# Patient Record
Sex: Female | Born: 1970 | Race: Black or African American | Hispanic: No | Marital: Married | Smoking: Never smoker
Health system: Southern US, Community
[De-identification: ages and names within clinical notes are randomized; demographics above are authoritative.]

## PROBLEM LIST (undated history)

## (undated) DIAGNOSIS — IMO0002 Reserved for concepts with insufficient information to code with codable children: Secondary | ICD-10-CM

## (undated) DIAGNOSIS — T7840XA Allergy, unspecified, initial encounter: Secondary | ICD-10-CM

## (undated) DIAGNOSIS — M1990 Unspecified osteoarthritis, unspecified site: Secondary | ICD-10-CM

## (undated) DIAGNOSIS — R011 Cardiac murmur, unspecified: Secondary | ICD-10-CM

## (undated) DIAGNOSIS — R519 Headache, unspecified: Secondary | ICD-10-CM

## (undated) HISTORY — DX: Allergy, unspecified, initial encounter: T78.40XA

## (undated) HISTORY — PX: LAPAROSCOPIC ENDOMETRIOSIS FULGURATION: SUR769

## (undated) HISTORY — DX: Headache, unspecified: R51.9

## (undated) HISTORY — PX: LAPAROSCOPY: SHX197

## (undated) HISTORY — PX: ANKLE SURGERY: SHX546

## (undated) HISTORY — DX: Unspecified osteoarthritis, unspecified site: M19.90

## (undated) HISTORY — DX: Reserved for concepts with insufficient information to code with codable children: IMO0002

## (undated) HISTORY — PX: WISDOM TOOTH EXTRACTION: SHX21

## (undated) HISTORY — PX: OTHER SURGICAL HISTORY: SHX169

## (undated) HISTORY — DX: Cardiac murmur, unspecified: R01.1

---

## 1998-08-11 HISTORY — PX: CHOLECYSTECTOMY: SHX55

## 2005-01-12 ENCOUNTER — Emergency Department: Payer: Self-pay | Admitting: Emergency Medicine

## 2005-08-20 ENCOUNTER — Emergency Department: Payer: Self-pay | Admitting: General Practice

## 2005-09-01 ENCOUNTER — Ambulatory Visit: Payer: Self-pay | Admitting: Family Medicine

## 2005-10-07 ENCOUNTER — Ambulatory Visit: Payer: Self-pay | Admitting: General Surgery

## 2005-10-23 ENCOUNTER — Ambulatory Visit: Payer: Self-pay | Admitting: Family Medicine

## 2005-11-24 ENCOUNTER — Ambulatory Visit: Payer: Self-pay | Admitting: Gynecology

## 2005-11-28 ENCOUNTER — Emergency Department: Payer: Self-pay | Admitting: General Practice

## 2005-12-08 ENCOUNTER — Encounter (INDEPENDENT_AMBULATORY_CARE_PROVIDER_SITE_OTHER): Payer: Self-pay | Admitting: Specialist

## 2005-12-08 ENCOUNTER — Ambulatory Visit (HOSPITAL_COMMUNITY): Admission: RE | Admit: 2005-12-08 | Discharge: 2005-12-08 | Payer: Self-pay | Admitting: Gynecology

## 2005-12-08 ENCOUNTER — Ambulatory Visit: Payer: Self-pay | Admitting: Gynecology

## 2005-12-22 ENCOUNTER — Ambulatory Visit: Payer: Self-pay | Admitting: Gynecology

## 2006-01-28 ENCOUNTER — Ambulatory Visit: Payer: Self-pay | Admitting: Gynecology

## 2006-03-21 ENCOUNTER — Emergency Department: Payer: Self-pay | Admitting: General Practice

## 2006-05-19 ENCOUNTER — Ambulatory Visit: Payer: Self-pay | Admitting: Gynecology

## 2006-08-11 HISTORY — PX: APPENDECTOMY: SHX54

## 2006-08-28 ENCOUNTER — Emergency Department: Payer: Self-pay | Admitting: Emergency Medicine

## 2006-08-31 ENCOUNTER — Ambulatory Visit: Payer: Self-pay | Admitting: Gynecology

## 2006-11-30 ENCOUNTER — Encounter (INDEPENDENT_AMBULATORY_CARE_PROVIDER_SITE_OTHER): Payer: Self-pay | Admitting: Gynecology

## 2006-11-30 ENCOUNTER — Ambulatory Visit: Payer: Self-pay | Admitting: Gynecology

## 2006-12-15 ENCOUNTER — Ambulatory Visit: Payer: Self-pay | Admitting: Gynecology

## 2007-03-16 ENCOUNTER — Ambulatory Visit: Payer: Self-pay | Admitting: Gynecology

## 2007-07-12 ENCOUNTER — Ambulatory Visit: Payer: Self-pay | Admitting: Gynecology

## 2007-10-12 ENCOUNTER — Ambulatory Visit: Payer: Self-pay | Admitting: Family Medicine

## 2007-12-30 ENCOUNTER — Encounter: Payer: Self-pay | Admitting: Obstetrics & Gynecology

## 2007-12-30 ENCOUNTER — Ambulatory Visit: Payer: Self-pay | Admitting: Gynecology

## 2008-03-16 ENCOUNTER — Ambulatory Visit: Payer: Self-pay | Admitting: Gynecology

## 2008-03-21 ENCOUNTER — Encounter: Payer: Self-pay | Admitting: Internal Medicine

## 2008-03-28 ENCOUNTER — Ambulatory Visit: Payer: Self-pay | Admitting: Gastroenterology

## 2008-04-07 ENCOUNTER — Ambulatory Visit: Payer: Self-pay | Admitting: Gastroenterology

## 2008-06-08 ENCOUNTER — Ambulatory Visit: Payer: Self-pay | Admitting: Obstetrics & Gynecology

## 2008-08-29 ENCOUNTER — Ambulatory Visit: Payer: Self-pay | Admitting: Obstetrics and Gynecology

## 2008-08-30 ENCOUNTER — Encounter: Payer: Self-pay | Admitting: Family Medicine

## 2008-11-23 ENCOUNTER — Ambulatory Visit: Payer: Self-pay | Admitting: Obstetrics & Gynecology

## 2008-12-25 ENCOUNTER — Encounter: Payer: Self-pay | Admitting: Obstetrics & Gynecology

## 2008-12-25 ENCOUNTER — Ambulatory Visit: Payer: Self-pay | Admitting: Obstetrics & Gynecology

## 2009-02-08 ENCOUNTER — Ambulatory Visit: Payer: Self-pay | Admitting: Podiatry

## 2009-04-09 ENCOUNTER — Encounter: Admission: RE | Admit: 2009-04-09 | Discharge: 2009-04-09 | Payer: Self-pay | Admitting: Orthopedic Surgery

## 2009-10-02 ENCOUNTER — Ambulatory Visit: Payer: Self-pay | Admitting: Obstetrics & Gynecology

## 2009-10-25 ENCOUNTER — Ambulatory Visit: Payer: Self-pay | Admitting: Obstetrics & Gynecology

## 2009-10-26 ENCOUNTER — Ambulatory Visit: Payer: Self-pay | Admitting: Obstetrics & Gynecology

## 2009-10-29 ENCOUNTER — Ambulatory Visit: Payer: Self-pay | Admitting: Obstetrics and Gynecology

## 2010-01-01 ENCOUNTER — Ambulatory Visit: Payer: Self-pay | Admitting: Family Medicine

## 2010-11-20 ENCOUNTER — Other Ambulatory Visit: Payer: Self-pay | Admitting: General Practice

## 2010-12-23 ENCOUNTER — Emergency Department: Payer: Self-pay | Admitting: Emergency Medicine

## 2010-12-24 NOTE — Assessment & Plan Note (Signed)
NAME:  [name] [name], [name] [name]                 ACCOUNT NO.:  [id number]   MEDICAL RECORD NO.:  [id number]          PATIENT TYPE:  POB   LOCATION:  [facility] at [facility]         FACILITY:  [facility]   PHYSICIAN:  Elsie Lincoln, MD      DATE OF BIRTH:  March 03, 1971   DATE OF SERVICE:                                  CLINIC NOTE   The patient is a 40 year old para 1 female, last menstrual period in  2008 secondary to Depo who presents for physical exam.  She has no  complaints today.  She is on the Depo for endometriosis and has had good  pain control from that.  Her endometriosis diagnosis is by laparoscopy.  She has occasional pain with deep penetration but not enough to require  any and intervention of time.   PAST MEDICAL HISTORY:  Asthma.   PAST SURGICAL HISTORY:  1. Ankle surgery.  2. C-section.  3. Gallbladder operative laparoscopy for endometriosis with      appendectomy by Dr. Mia Creek.   GYN HISTORY:  Ovarian cyst in 2007 which prompted the laparoscopic  surgery and endometriosis.   OB HISTORY:  C-section x1.   FAMILY HISTORY:  Dad died of colon cancer when she was only 3.  She has  had 2 colonoscopies in the past and is in need of another one.  We will  refer her to Dr. Stan Head today.   ALLERGIES:  ASPIRIN.   MEDICATIONS:  1. Depo-Provera.  2. Ventolin.  3. Asmanex.  4. Singulair.   REVIEW OF SYSTEMS:  Negative.   PHYSICAL EXAM:  Pulse 69.  Blood pressure 119/71.  Weight 136.  Height 5  feet 3.  GENERAL:  Well nourished, well developed, no apparent distress.  HEENT:  Normocephalic, atraumatic.  NECK:  No masses, normal thyroid.  LUNGS:  Clear to auscultation bilaterally.  HEART:  Regular rate and rhythm.  BREASTS:  No masses.  No lymphadenopathy.  ABDOMEN:  Soft, nontender.  No organomegaly.  No hernia.  Well-healed  laparoscopy incision.  GENITALIA:  Tanner 5 with a small inclusion cyst on the left labia  majora.  Vagina pink.  Normal rugae.  Cervix closed,  nulliparous,  nontender.  Uterus nontender, midline.  Adnexa, no masses, nontender.  Good support of urethra.  No cystocele, rectocele.  No hemorrhoids.  EXTREMITIES:  Nontender.  No edema.   ASSESSMENT/PLAN:  A 40 year old female for Pap smear today.  1. Pap smear.  2. Referral to [facility] gastroenterology.  3. Depo-Provera every 12 weeks.           ______________________________  Elsie Lincoln, MD     KL/MEDQ  D:  12/30/2007  T:  12/30/2007  Job:  [id number]

## 2010-12-24 NOTE — Assessment & Plan Note (Signed)
NAME:  [name] [name], [name] [name]                 ACCOUNT NO.:  [id number]   MEDICAL RECORD NO.:  [id number]          PATIENT TYPE:  POB   LOCATION:  [facility] at [facility]         FACILITY:  [facility]   PHYSICIAN:  Scheryl Darter, MD       DATE OF BIRTH:  1971/05/26   DATE OF SERVICE:                                  CLINIC NOTE   The patient comes today with about a month of discomfort with  intercourse.  She says that upon penetration there is a sensation that  there is something blocking the vaginal canal.  She does not state that  this is painful.  She currently uses NuvaRing as a continuous regimen so  that there is no withdrawal bleeding.  Due to the history of  endometriosis, she changes the NuvaRing every 3 weeks.  She recently  treated herself for yeast infection with Monistat after taking  antibiotics last month.  She says she has not noticed any itching, but  only a discharge.  She notes no itching now.  No bleeding.  She has a  bowel movement about 3 times a week.  She has had problems with IBS  constipation.   PHYSICAL EXAMINATION:  GENERAL:  The patient in no acute distress.  ABDOMEN:  Soft, nontender.  PELVIC:  External genitalia, vagina and cervix appeared normal with a  NuvaRing in place and white discharge consistent with yeast.  Uterus is  nontender, normal size.  No adnexal masses.  Upon bimanual exam, I could  feel stool in the rectum through the posterior vaginal wall.   IMPRESSION:  1. Discomfort with intercourse.  2. Yeast vaginitis.  3. History of constipation.   PLAN:  Her symptoms may simply be vaginal dryness and irritation.  I  gave her prescription for Diflucan 150 mg tablet.  Advised her that she  could use the NuvaRing for 4 weeks at a time before changing.  Most  likely she will keep with her current schedule.  She will notify us if  her symptoms do not improve.  I reassured her that there was no sign of  any sort of mass in her pelvis that is causing her  symptoms.      Scheryl Darter, MD     JA/MEDQ  D:  10/02/2009  T:  10/03/2009  Job:  [id number]

## 2010-12-24 NOTE — Assessment & Plan Note (Signed)
NAME:  [name] [name], [name] [name]                 ACCOUNT NO.:  [id number]   MEDICAL RECORD NO.:  [id number]          PATIENT TYPE:  POB   LOCATION:  [facility] at [facility]         FACILITY:  [facility]   PHYSICIAN:  Tinnie Gens, MD        DATE OF BIRTH:  October 22, 1970   DATE OF SERVICE:  01/01/2010                                  CLINIC NOTE   CHIEF COMPLAINT:  Yearly exam.   HISTORY OF PRESENT ILLNESS:  The patient is a 40 year old gravida 1,  para 1 who is in today for physical exam.  She currently uses a NuvaRing  vaginally continuously since she does not have cycles and they control  endometriosis pain.  This has been working well for her.  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   PAST MEDICAL HISTORY:  Significant for asthma.   PAST SURGICAL HISTORY:  She had ankle surgery to repair, damage done by  lawnmower when she was a young child.  She has had a C-section.  She had  a operative laparoscopy for endometriosis, appendectomy,  cholecystectomy.   FAMILY HISTORY:  Father died of colon cancer when she was 3.   ALLERGIES:  ASPIRIN.   MEDICATIONS:  1. She is on Ventolin p.r.n.  2. Ibuprofen as needed.   GYN HISTORY:  History of endometriosis and ovarian cyst.   OB HISTORY:  One C-section.   REVIEW OF SYSTEMS:  Fourteen-point review of systems reviewed.  She  denies headache, vision changes, hearing loss, chest pain, shortness of  breath, abdominal pain, nausea, vomiting, diarrhea, constipation, blood  in her stool, blood in her urine, vaginal discharge, breast mass or  lump, swelling in her feet or ankles.   PHYSICAL EXAMINATION:  VITAL SIGNS:  On exam today, vitals are as in the  chart.  GENERAL:  She is a well-developed, well-nourished female in no acute  distress.  HEENT:  Normocephalic, atraumatic.  Sclerae anicteric.  NECK:  Supple.  Normal thyroid.  LUNGS:  Clear bilaterally.  CV:  Regular rate and rhythm without gallops or murmurs.  ABDOMEN:  Soft, nontender,  nondistended.  EXTREMITIES:  No cyanosis, clubbing, or edema.  BREASTS:  Symmetric with everted nipples.  No masses.  No  supraclavicular or axillary adenopathy.  GU:  Normal external female genitalia.  BUS is normal.  Vagina is pink  and rugated.  Cervix is nulliparous without lesion.  Uterus is small,  anteverted.  No adnexal mass or tenderness.   IMPRESSION:  GYN exam.   PLAN:  1. Pap smear today.  2. Refill ibuprofen.           ______________________________  Tinnie Gens, MD     TP/MEDQ  D:  01/01/2010  T:  01/02/2010  Job:  [id number]

## 2010-12-24 NOTE — Assessment & Plan Note (Signed)
NAME:  [name] [name], [name] [name]                 ACCOUNT NO.:  [id number]   MEDICAL RECORD NO.:  [id number]          PATIENT TYPE:  POB   LOCATION:  [facility] at [facility]         FACILITY:  [facility]   PHYSICIAN:  Elsie Lincoln, MD      DATE OF BIRTH:  1971/04/24   DATE OF SERVICE:  12/25/2008                                  CLINIC NOTE   This is a 40 year old, para 1, female who presents for her annual exam.  The patient is a long-term patient of Dr. Mia Creek.  For endometriosis,  the patient used to be on Depo-Lupron, was then on Depo-Provera for a  while.  She started experiencing breakthrough bleeding, so we changed to  the NuvaRing which has helped.  She does have tender breasts also on the  NuvaRing, but it is not unbearable.  Her pain as well controlled.  Occasionally, she does have mild pelvic pain.  Her sexual life is not  interrupted by this pain.  The patient has no complaints today.  I did  talk to the patient about her bowel movements, and she is very  constipated.  I actually told the pain will be better if she has more  regular bowel movements.   PAST MEDICAL HISTORY:  Asthma.   PAST SURGICAL HISTORY:  Ankle surgery, C-section, operative laparoscopy  for endometriosis, appendectomy, and cholecystectomy.   FAMILY HISTORY:  No changes in familial cancers or blood clots.   MEDICATIONS:  Voltaren cream, NuvaRing, and Mobic.   ALLERGIES:  ASPIRIN.   Review of systems is positive for constipation.   PHYSICAL EXAMINATION:  VITAL SIGNS:  Pulse 67, blood pressure 122/79,  weight 139, height 5 feet 3 inches.  GENERAL:  Well nourished, well developed in no apparent distress.  HEENT:  Normocephalic, atraumatic.  Good dentition.  NECK:  Thyroid, no masses or lymphadenopathy in the neck.  LUNGS:  Clear to auscultation bilaterally.  HEART:  Regular rate and rhythm.  BREASTS:  No masses.  Nontender.  No nipple discharge or skin changes.  No lymphadenopathy in the axilla.  ABDOMEN:  Soft,  nontender.  No rebound.  No guarding.  No organomegaly.  No hernia.  Well healed incisions from her laparoscopies.  GENITALIA:  Tanner V.  Vagina pink, normal rugae.  Urethra and bladder  well suspended, nontender.  Cervix closed, nontender.  Uterus nontender,  mobile.  Adnexa mildly tender.  You could feel bowel gas on the bimanual  but no masses.  EXTREMITIES:  Nontender.   ASSESSMENT AND PLAN:  A 40 year old female for well-woman exam.  1. Pap smear.  I reviewed ACOG with the patient, if this one is      negative, we will have 3 documented negative Pap smears.  No      history of human immunodeficiency virus, immunosuppression, DES      exposure, she will have a q. 3 year screening.  2. MiraLax for constipation.  3. Decrease sodas and start exercising for cardiovascular health and      weight loss.  4. Continue NuvaRing.  The patient is still a nonsmoker.  5. Return to clinic in a year.  ______________________________  Elsie Lincoln, MD     KL/MEDQ  D:  12/25/2008  T:  12/26/2008  Job:  [id number]

## 2010-12-24 NOTE — Assessment & Plan Note (Signed)
NAME:  [name] [name], [name] [name]                 ACCOUNT NO.:  [id number]   MEDICAL RECORD NO.:  [id number]          PATIENT TYPE:  POB   LOCATION:  [facility] at [facility]         FACILITY:  [facility]   PHYSICIAN:  Johnella Moloney, MD        DATE OF BIRTH:  [birth year]   DATE OF SERVICE:  11/23/2008                                  CLINIC NOTE   The patient is a 40 year old gravida 1, para 1 with a long history of  endometriosis, who was treated with Depo-Lupron for over 1 year switched  over to Depo-Provera in December 2008.  The patient reports that she has  had no problems with the Depo-Provera until 3 months ago when she starts  to have daily spotting.  She is very bothered by the amount of spotting  she is having and wants to discuss other alternatives to treating her  endometriosis.  The patient reports occasional pelvic pain that is  severe.  She denies any other gynecologic concerns.   MEDICAL HISTORY:  Only remarkable for asthma.   SURGICAL HISTORY:  Remarkable for ankle surgery, cesarean section,  operative laparoscopy for endometriosis with appendectomy and  cholecystectomy.   PHYSICAL EXAMINATION:  Deferred for this patient.  She does have stable  vital signs noted in her physical chart.   IMPRESSION:  The patient is a 40 year old gravida 1, para 1 here for  followup of her endometriosis and discussion of further options for  endometriosis management.  Her last Depo-Provera injection was given on  August 29, 2008.  The patient is interested in other modalities.  I  discussed with her that her spotting could be alleviated by adding  estrogen back to her therapy in the form of oral contraceptive pills or  NuvaRing.  Also discussed the possibility of using a Depo-Delalutin just  to stabilize her endometrium.  However, this might increase her pelvic  pain given the large dosage of estrogen.  Also revisited Depo-Lupron,  but suggested that this time she would need to have add-back therapy for  that.  The patient thought about these modalities and wants to proceed  with possible NuvaRing or oral contraceptive pill.  She did not seem to  make a final decision about which modality she will go by, so she was  given a sample of Loestrin 24 and a sample of NuvaRing to use.  The  patient will return next month for her annual Pap smear.  Of note, her  last Pap smear was in Dec 30, 2007, and was negative.  At this point,  she would let us know which modality she fell works better for her and  we will proceed with that.  The patient was given written information  about Lupron and add-back therapy.  She is interested in future  fertility, so an ablation or hysterectomy is not an option for her at  this point.  We will continue to monitor her endometriosis and alter her  therapy or management modality as needed.            ______________________________  Johnella Moloney, MD     UD/MEDQ  D:  11/23/2008  T:  11/24/2008  Job:  [id number]

## 2010-12-24 NOTE — Assessment & Plan Note (Signed)
NAME:  [name] [name], [name] [name]                 ACCOUNT NO.:  [id number]   MEDICAL RECORD NO.:  [id number]          PATIENT TYPE:  POB   LOCATION:  [facility] at [facility]         [facility]ACILITY:  [facility]   PHYSICIAN:  Elsie Lincoln, MD      DATE OF BIRTH:  Jun 23, 1971   DATE OF SERVICE:  10/25/2009                                  CLINIC NOTE   The patient is a 40 year old female who is complaining of spasm in the  lower pelvic and increased urinary frequency.  This has been going on  for approximately a month, has increased lately.  The patient denies any  dysuria.  The patient has been having more with frequent bowel movements  and she has had one in the past few days every day and has been softer  and not hard bowels as usual.  She does not feel like she completely  empty her bladder.  She is not having as much pain with sex.  She feels  like there are butterflies in her lower pelvis.   PHYSICAL EXAMINATION:  VITAL SIGNS:  Pulse 72, blood pressure 122/83,  weight 131, and height 5 feet 3 inches.  GENERAL:  Well nourished, well developed, no apparent distress.  ABDOMEN:  Soft, nontender.  No rebound or guarding.  GENITALIA:  Tanner V.  Vagina pink, normal rugae.  Bladder tender over  palpation.  Cervix is closed.  Uterus is anterior.  Adnexa, no masses,  nontender.  No stool in rectal vault.   ASSESSMENT AND PLAN:  A 40 year old female with urinary frequency and  pelvic cramping.  1. Urinalysis shows trace blood.  The patient will be treated with      Cipro 500 mg p.o. b.i.d. for 3 days and Pyridium for symptoms.  2. If the patient is not better by tomorrow, we will do post-void      residual.  3. Continue all other medications.  4. The patient does not seem to be constipated today based on      decreased stool in the vault.           ______________________________  Elsie Lincoln, MD     KL/MEDQ  D:  10/25/2009  T:  10/25/2009  Job:  [id number]

## 2010-12-26 ENCOUNTER — Ambulatory Visit: Payer: Self-pay | Admitting: General Practice

## 2010-12-27 NOTE — Op Note (Signed)
NAME:  [name] [name], [name] [name]                 ACCOUNT NO.:  [id number]   MEDICAL RECORD NO.:  [id number]          PATIENT TYPE:  AMB   LOCATION:  [facility]                           FACILITY:  [facility]   PHYSICIAN:  Ginger Carne, MD  DATE OF BIRTH:  1971-02-04   DATE OF PROCEDURE:  12/08/2005  DATE OF DISCHARGE:                                 OPERATIVE REPORT   PREOPERATIVE DIAGNOSIS:  Chronic pelvic pain.   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   OPERATIVE PROCEDURE:  Laparoscopic excision of left ovarian endometrioma and  laparoscopic appendectomy.   SURGEON:  Ginger Carne, MD   ASSISTANT:  None.   COMPLICATIONS:  None immediate.   ESTIMATED BLOOD LOSS:  Minimal.   SPECIMEN:  Appendix and left ovarian endometrioma.   ANESTHESIA:  General.   OPERATIVE FINDINGS:  Laparoscopic evaluation demonstrated evidence of a 2 cm  endometrioma located in the antimesenteric portion of the left ovary.  There  were areas of punctate lesions of endometriosis along the left broad  ligament with adherence of portion of the left ovary to endometriosis on the  posterior aspect of the broad ligament.  The right broad ligament, tube and  ovary were relatively free of endometriosis.  No evidence of adhesive  disease of either tube.  The uterus was normal in size and contour, anterior  bladder flap appeared normal.  Appendix was thickened, short, suggestive of  the subacute appendicitis or fecalith with appendiceal flecks along the  base.  Large and small bowel grossly normal, no evidence of femoral,  inguinal or obturator hernias.  Upper abdomen appeared normal.   OPERATIVE PROCEDURE:  The patient prepped and draped in the usual fashion  and placed in lithotomy position.  Betadine solution was used for  antiseptic.  The patient was catheterized prior to the procedure.  After  adequate general anesthesia, a  tenaculum was placed on the antrum lip of the  cervix and a Hulka tenaculum in the endocervical canal.  Afterwards a  vertical infraumbilical incision was made and a Veress needle placed in the  abdomen.  Closing pressures were 10 to .  Needle released, trocar  placed in the same incision.  Laparoscope placed in trocar sleeve.  Two 5 mm  ports were made under direct visualization in the left lower quadrant, left  hypogastric regions.  Following this using bipolar cautery and sharp  dissection, the left ovarian endometrioma was excised.  The base was  cauterized as well as the edges with no active bleeding noted after copious  irrigation lactated Ringer's.  Areas of additional endometriotic areas were  excised with the left ureter in full view.   At this point the appendix was removed by bipolar cautery of the  mesoappendix to the base.  2-0 Vicryl loops placed at the base one about 7-8  mm above the first two.  Appendix cut above the base sutures, removed with  an Endopouch bag.  No active bleeding noted at the base and copious  irrigation with  lactated Ringer's followed and removal of irrigant as well.  Afterwards gas released, trocars removed.  Closure of 10 mm fascia site with  0 Vicryl suture and 4-0 Vicryl for subcuticular closures,  Instrument and  sponge count were correct.  The patient tolerated the procedure well,  returned to post anesthesia recovery room in excellent condition.      Ginger Carne, MD  Electronically Signed     SHB/MEDQ  D:  12/08/2005  T:  12/09/2005  Job:  [id number]

## 2010-12-27 NOTE — H&P (Signed)
NAME:  [name], [name]                 ACCOUNT NO.:  [id number]   MEDICAL RECORD NO.:  [id number]          PATIENT TYPE:  AMB   LOCATION:  [facility]                           FACILITY:  [facility]   PHYSICIAN:  Ginger Carne, MD  DATE OF BIRTH:  May 04, 1971   DATE OF ADMISSION:  12/08/2005  DATE OF DISCHARGE:                                HISTORY & PHYSICAL   REASON FOR HOSPITALIZATION:  Chronic pelvic pain.   HISTORY OF PRESENT ILLNESS:  This patient is a 39 year old gravida 1, para 1-  0-0-1 African-American female referred by Dr. Carlynn Purl in [place], [place] because of a history of chronic lower and midline pelvic pain and  resolving left ovarian cyst.  The patient had two sonograms in the early  winter of 2007 demonstrating a cystic enlargement of the left ovary with  multiple follicular cysts.  Prior to that time, the patient has had a 5-6  month history of chronic pelvic pain, worse on the left.  She complains of  dyspareunia and dysmenorrhea, as well.  Menses are q. 28 days, lasting 3-4  days.  She does not use any form of contraception.  The patient has no  genitourinary, gastrointestinal or musculoskeletal sources for discomfort.  The patient has elected to pursue definitive diagnosis as a cause of her  pelvic pain.   OBSTETRIC/GYNECOLOGIC HISTORY:  The patient has had a cesarean section in  1995.   SURGICAL HISTORY:  In 1988m she had a laparoscopic cholecystectomy.   ALLERGIES:  ASPIRIN.   CURRENT MEDICATIONS:  1.  Ferrous sulfate.  2.  Ibuprofen.   SOCIAL HISTORY:  Denies smoking, illicit drug or alcohol abuse.   FAMILY HISTORY:  Noncontributory.   REVIEW OF SYSTEMS:  10-point comprehensive review of systems unremarkable.   PHYSICAL EXAMINATION:  VITAL SIGNS:  Blood pressure 129/81, weight 125  pounds, height 62 inches, pulse 76 and regular.  ABDOMEN:  Soft without gross hepatosplenomegaly.  LUNGS:  Clear to percussion and auscultation.  CARDIOVASCULAR:  Without  murmurs or enlargements.  Regular rate and rhythm.  EXTREMITY/LYMPHATIC/SKIN/NEUROLOGICAL/MUSCULOSKELETAL:  Normal.  ABDOMEN:  Soft without gross hepatosplenomegaly.  PELVIC:  External genitalia, vulva and vagina normal.  Cervix smooth with  slight tenderness on motion.  Both adnexa tender on motion but without  fullness.  Uterus is normal size, slightly tender on palpation.  RECTAL:  Hemoccult negative without masses.   LABORATORY DATA:  Urinalysis is normal.  Transvaginal ultrasound obtained on  November 25, 2005 demonstrates normal-appearing uterus and right adnexa.  Left  adnexa reveals a 13 mm complex cystic mass, consistent with either a corpus  luteal cyst or endometrioma.  Overall dimension was 2.8 cm.  No free fluid  noted.   IMPRESSION:  Chronic pelvic pain.   PLAN:  The patient has symptoms suggestive of endometriosis; however, this  is not diagnostic by itself.  She is, therefore, scheduled for an operative  laparoscopy to evaluate the etiology for her pain.  The nature of said  procedure discussed in detail.      Ginger Carne, MD  Electronically Signed  SHB/MEDQ  D:  12/02/2005  T:  12/02/2005  Job:  [id number]

## 2010-12-27 NOTE — Group Therapy Note (Signed)
NAME:  [name] [name], MENON                 ACCOUNT NO.:  [id number]   MEDICAL RECORD NO.:  [id number]          PATIENT TYPE:   LOCATION:  [facility]                     FACILITY:   PHYSICIAN:  Ginger Carne, MD DATE OF BIRTH:  [birth year]   DATE OF SERVICE:                                    CLINIC NOTE   REASON FOR CONSULTATION:  Chronic left lower quadrant pain.   HISTORY OF PRESENT ILLNESS:  This patient is a 40 year old gravida 1, para 1-  0-0-1, African American female referred by Dr. Carlynn Purl because of history of  left ovarian cyst and chronic left lower quadrant pain.  The patient has had  two sonograms in 2007, revealing enlargement of her left ovary with multiple  cysts.  The patient states that over the past five months, she has had  worsening left lower quadrant, as well as central pelvic pain, which has  worsened over the past two months.  She complains of dyspareunia and  dysmenorrhea, as well.  Menses are every 28 days, lasting three to four  days.  At this time, she does not use any form of contraception.  The  patient has no genitourinary, gastrointestinal, or musculoskeletal sources  for her discomfort.   OB/GYN HISTORY:  The patient has had a cesarean section in 1995.   SURGICAL HISTORY:  In 1998, she had a laparoscopic cholecystectomy.   CURRENT MEDICATIONS:  Ferrous sulfate and ibuprofen.   ALLERGIES:  Aspirin.   SOCIAL HISTORY:  Denies smoking, illicit drug or alcohol abuse.   FAMILY HISTORY:  Noncontributory.   REVIEW OF SYSTEMS:  Ten-point comprehensive review of systems unremarkable.   PHYSICAL FINDINGS:  Blood pressure 129/81, weight 125 pounds, height 62  minutes, pulse 76 and regular.  Abdomen:  Soft without gross  hepatosplenomegaly.  Tenderness in the pelvic region.  Pelvic:  External  genitalia, vulva and vagina normal.  Cervix smooth without erosions or  lesions.  Uterine tenderness on motion, as well as both adnexa but without  fullness.   Rectal:  Hemoccult negative without masses.   Urinalysis is normal.   Transvaginal ultrasound reveals a normal-appearing uterus and right adnexa.  Left adnexa reveals a 13-mm left cyst consistent with either a corpus luteum  and/or endometrioma.  Overall ovarian dimension is 2.8 cm.  No free fluid  noted.   IMPRESSION:  Chronic pelvic pain.   PLAN:  The patient has symptoms suggestive of endometriosis but not  diagnostic.  The patient is scheduled for an operative laparoscopy to  evaluate the etiology for her pain and to determine if, in fact, she has  endometriosis.  The patient was given full explanation as to the nature of  said procedure including risks and benefits and a booklet provided.  She  will be scheduled in the near future for said surgery.  All questions  answered to the satisfaction of said patient.           ______________________________  Ginger Carne, MD     SHB/MEDQ  D:  11/25/2005  T:  11/26/2005  Job:  [id number]

## 2010-12-31 ENCOUNTER — Telehealth: Payer: Self-pay | Admitting: Cardiovascular Disease

## 2010-12-31 ENCOUNTER — Ambulatory Visit: Payer: Self-pay | Admitting: Cardiovascular Disease

## 2010-12-31 NOTE — Telephone Encounter (Signed)
LMOM in regards to missed appt with Gollan on 12/31/10.

## 2011-01-08 ENCOUNTER — Ambulatory Visit: Payer: Self-pay

## 2011-01-15 ENCOUNTER — Ambulatory Visit (INDEPENDENT_AMBULATORY_CARE_PROVIDER_SITE_OTHER): Payer: PRIVATE HEALTH INSURANCE | Admitting: Obstetrics & Gynecology

## 2011-01-15 DIAGNOSIS — Z01419 Encounter for gynecological examination (general) (routine) without abnormal findings: Secondary | ICD-10-CM

## 2011-01-15 DIAGNOSIS — N803 Endometriosis of pelvic peritoneum, unspecified: Secondary | ICD-10-CM

## 2011-01-15 DIAGNOSIS — Z113 Encounter for screening for infections with a predominantly sexual mode of transmission: Secondary | ICD-10-CM

## 2011-01-15 DIAGNOSIS — Z1272 Encounter for screening for malignant neoplasm of vagina: Secondary | ICD-10-CM

## 2011-02-21 NOTE — Assessment & Plan Note (Signed)
NAME:  [name] [name], [name] [name]                 ACCOUNT NO.:  [id number]  MEDICAL RECORD NO.:  [id number]           PATIENT TYPE:  LOCATION:  [facility]           FACILITY:  PHYSICIAN:  Jaynie Collins, MD          DATE OF BIRTH:  DATE OF SERVICE:  01/15/2011                                 CLINIC NOTE  REASON FOR VISIT:  Yearly gynecologic exam.  HISTORY OF PRESENT ILLNESS:  [name] [name] is a 40 year old gravida 1, para 1 who is here today for her annual examination.  The patient has a history of endometriosis which is managed by a being on continuous NuvaRing and this has been working very well for her and she does request a refill today.  She has no other gynecologic concerns.  PAST OB/GYN HISTORY:  Gravida 1, para 1, and one cesarean section.  The patient has a history of endometriosis and ovarian cyst.  No cervical dysplasia or sexually transmitted infection history.  PAST MEDICAL HISTORY:  Asthma.  PAST SURGICAL HISTORY:  One cesarean section, operative laparoscopy for endometriosis, appendectomy and cholecystectomy, ankle surgery to repair damage done by lawn mower when she was young.  FAMILY HISTORY:  Father died of colon cancer.  No gynecologic cancers.  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  She denies any tobacco, alcohol, or illicit drug use.  The patient is single and not in a relationship currently.  REVIEW OF SYSTEMS:  Fourteen point review of systems were reviewed and were negative.  PHYSICAL EXAMINATION:  VITAL SIGNS:  Pulse 64, blood pressure 124/86, weight 128.8 pounds, and height 5 feet 3 inches. GENERAL:  No apparent distress. HEENT:  Normocephalic and atraumatic. NECK:  Supple.  Normal thyroid. LUNGS:  Clear to auscultation bilaterally. HEART:  Regular rate and rhythm. BREASTS:  Symmetric in size, nontender, no abnormal masses, skin changes, nipple drainage, or lymphadenopathy noted. ABDOMEN:  Soft, nontender, and nondistended.  No  organomegaly.  Well- healed laparoscopy incision scars. EXTREMITIES:  No cyanosis, clubbing, or edema. PELVIC:  Normal external female genitalia.  Pink and well-rugated vagina.  Normal cervical contour.  Normal discharge.  Pap smear was obtained.  Uterus is small and anteverted, nontender.  No adnexal masses or tenderness.  ASSESSMENT AND PLAN:  The patient is a 40 year old gravida 1, para 1 here for annual examination.  The patient has a history of endometriosis and is being managed on continuous hormonal therapy in the form of NuvaRing.  She was given a refill of this today.  We will follow up on her Pap smear today.  The patient did request sexually transmitted infection screen which also will be done and we will follow up on these results.  She was told to call or come back in for any further gynecologic concerns.          ______________________________ Jaynie Collins, MD    UA/MEDQ  D:  01/15/2011  T:  01/16/2011  Job:  [id number]

## 2011-08-22 ENCOUNTER — Emergency Department: Payer: Self-pay | Admitting: Emergency Medicine

## 2011-12-30 ENCOUNTER — Encounter: Payer: Self-pay | Admitting: Obstetrics & Gynecology

## 2011-12-30 ENCOUNTER — Ambulatory Visit (INDEPENDENT_AMBULATORY_CARE_PROVIDER_SITE_OTHER): Payer: PRIVATE HEALTH INSURANCE | Admitting: Obstetrics & Gynecology

## 2011-12-30 VITALS — BP 118/75 | HR 70 | Ht 63.0 in | Wt 124.0 lb

## 2011-12-30 DIAGNOSIS — Z1231 Encounter for screening mammogram for malignant neoplasm of breast: Secondary | ICD-10-CM

## 2011-12-30 DIAGNOSIS — Z124 Encounter for screening for malignant neoplasm of cervix: Secondary | ICD-10-CM

## 2011-12-30 DIAGNOSIS — Z0000 Encounter for general adult medical examination without abnormal findings: Secondary | ICD-10-CM

## 2011-12-30 MED ORDER — ETONOGESTREL-ETHINYL ESTRADIOL 0.12-0.015 MG/24HR VA RING: VAGINAL_RING | VAGINAL | Status: DC

## 2011-12-30 NOTE — Progress Notes (Signed)
Subjective:    [name] [name] is a 41 y.o. female who presents for an annual exam. The patient has no complaints today. She wants a refill on her Nuvaring. She takes it continuously to prevent dysmenorrhea. The patient is not currently sexually active. GYN screening history: last pap: was normal. The patient wears seatbelts: yes. The patient participates in regular exercise: no. Has the patient ever been transfused or tattooed?: no. The patient reports that there is not domestic violence in her life.   Menstrual History: OB History    Grav Para Term Preterm Abortions TAB SAB Ect Mult Living   1 1        1       Menarche age: 23 No LMP recorded. Patient is not currently having periods (Reason: Other).    The following portions of the patient's history were reviewed and updated as appropriate: allergies, current medications, past family history, past medical history, past social history, past surgical history and problem list.  Review of Systems A comprehensive review of systems was negative. She works for [employer]. Her 33 yo son graduates high school this year and will attend [employer]   Objective:    BP 118/75  Pulse 70  Ht 5\' 3"  (1.6 m)  Wt 56.246 kg (124 lb)  BMI 21.97 kg/m2  General Appearance:    Alert, cooperative, no distress, appears stated age  Head:    Normocephalic, without obvious abnormality, atraumatic  Eyes:    PERRL, conjunctiva/corneas clear, EOM's intact, fundi    benign, both eyes  Ears:    Normal TM's and external ear canals, both ears  Nose:   Nares normal, septum midline, mucosa normal, no drainage    or sinus tenderness  Throat:   Lips, mucosa, and tongue normal; teeth and gums normal  Neck:   Supple, symmetrical, trachea midline, no adenopathy;    thyroid:  no enlargement/tenderness/nodules; no carotid   bruit or JVD  Back:     Symmetric, no curvature, ROM normal, no CVA tenderness  Lungs:     Clear to auscultation bilaterally, respirations unlabored  Chest  Wall:    No tenderness or deformity   Heart:    Regular rate and rhythm, S1 and S2 normal, no murmur, rub   or gallop  Breast Exam:    No tenderness, masses, or nipple abnormality  Abdomen:     Soft, non-tender, bowel sounds active all four quadrants,    no masses, no organomegaly  Genitalia:    Normal female without lesion, discharge or tenderness, NSSA, mobile, mushy c/w adenomyosis, no adnexal masses     Extremities:   Extremities normal, atraumatic, no cyanosis or edema  Pulses:   2+ and symmetric all extremities  Skin:   Skin color, texture, turgor normal, no rashes or lesions  Lymph nodes:   Cervical, supraclavicular, and axillary nodes normal  Neurologic:   CNII-XII intact, normal strength, sensation and reflexes    throughout  .    Assessment:    Healthy female exam.    Plan:     Mammogram. Pap smear.  Refill on continuous Nuvaring

## 2012-01-26 ENCOUNTER — Ambulatory Visit (HOSPITAL_COMMUNITY): Payer: PRIVATE HEALTH INSURANCE

## 2012-02-24 ENCOUNTER — Ambulatory Visit (HOSPITAL_COMMUNITY): Admission: RE | Admit: 2012-02-24 | Payer: PRIVATE HEALTH INSURANCE | Source: Ambulatory Visit

## 2012-12-21 ENCOUNTER — Encounter: Payer: Self-pay | Admitting: Obstetrics and Gynecology

## 2012-12-21 ENCOUNTER — Ambulatory Visit (INDEPENDENT_AMBULATORY_CARE_PROVIDER_SITE_OTHER): Payer: PRIVATE HEALTH INSURANCE | Admitting: Obstetrics and Gynecology

## 2012-12-21 VITALS — BP 133/76 | HR 89 | Ht 63.0 in | Wt 125.0 lb

## 2012-12-21 DIAGNOSIS — N949 Unspecified condition associated with female genital organs and menstrual cycle: Secondary | ICD-10-CM

## 2012-12-21 DIAGNOSIS — R102 Pelvic and perineal pain: Secondary | ICD-10-CM

## 2012-12-21 MED ORDER — HYDROCODONE-ACETAMINOPHEN 5-325 MG PO TABS: 1.0000 | ORAL_TABLET | Freq: Four times a day (QID) | ORAL | Status: DC | PRN

## 2012-12-21 NOTE — Progress Notes (Signed)
  Subjective:    Patient ID: [name] [name], female    DOB: [birth year], 42 y.o.   MRN: [id number]  HPI  42 yo G1P1 with amenirrhea induced secondary to the continuous use of Nuvaring for several years, presenting today for the evaluation of a 1 1/2 week h/o cramping pain. Patient describes it as menstrual cramp like pain. She has been using 800 mg of ibuprofen with some relief but feels that her pain is getting worst. Patient does report some constipation but doesn't think that it is the issue. She denies dysuria or symptoms of UTI.  Past Medical History  Diagnosis Date  . Asthma   . Endometriosis   . Constipation   . Asthma   . Ulcer   . Arthritis   . History of blood clots     DURING PREGNANCY AND PERIODS  . Heart murmur   . Allergy   . Heart murmur    Past Surgical History  Procedure Laterality Date  . Ankle surgery    . Cesarean section    . Laparoscopy      for endometriosis  . Appendectomy    . Cholecystectomy    . Laparoscopic endometriosis fulguration    . Ankle surgery    . Appendectomy    . Cholecystectomy    . Gallbladder surgery    . Wisdom tooth extraction      x2   Family History  Problem Relation Age of Onset  . COPD Mother   . Emphysema Mother   . Cancer Father     unknown  . Cancer Paternal Aunt     ovarian   History  Substance Use Topics  . Smoking status: Never Smoker   . Smokeless tobacco: Never Used  . Alcohol Use: No     Review of Systems  All other systems reviewed and are negative.       Objective:   Physical Exam GENERAL: Well-developed, well-nourished female in no acute distress.  ABDOMEN: Soft, nontender, nondistended. No organomegaly. PELVIC: Normal external female genitalia. Uterus is normal in size. No adnexal mass or tenderness. Nuvaring in place EXTREMITIES: No cyanosis, clubbing, or edema, 2+ distal pulses.     Assessment & Plan:  42 yo G1P1 here for evaluation of cramping pain for 1 1/2 week - Will order pelvic  ultrasound - Rx Vicodin provided as needed - Advise patient to allow herself to have one period and restart continuous use of Nuvaring - RTC in a few weeks for annual exam

## 2012-12-29 ENCOUNTER — Ambulatory Visit (HOSPITAL_COMMUNITY)
Admission: RE | Admit: 2012-12-29 | Discharge: 2012-12-29 | Disposition: A | Discharge status: Home / Self Care | Payer: PRIVATE HEALTH INSURANCE | Source: Ambulatory Visit | Attending: Obstetrics and Gynecology | Admitting: Obstetrics and Gynecology

## 2012-12-29 DIAGNOSIS — N949 Unspecified condition associated with female genital organs and menstrual cycle: Secondary | ICD-10-CM | POA: Insufficient documentation

## 2012-12-29 DIAGNOSIS — Z8742 Personal history of other diseases of the female genital tract: Secondary | ICD-10-CM | POA: Insufficient documentation

## 2012-12-29 DIAGNOSIS — R102 Pelvic and perineal pain: Secondary | ICD-10-CM

## 2013-01-05 ENCOUNTER — Encounter: Payer: Self-pay | Admitting: Obstetrics & Gynecology

## 2013-01-05 ENCOUNTER — Ambulatory Visit (INDEPENDENT_AMBULATORY_CARE_PROVIDER_SITE_OTHER): Payer: PRIVATE HEALTH INSURANCE | Admitting: Obstetrics & Gynecology

## 2013-01-05 VITALS — BP 124/79 | HR 76 | Ht 63.0 in | Wt 123.4 lb

## 2013-01-05 DIAGNOSIS — Z30018 Encounter for initial prescription of other contraceptives: Secondary | ICD-10-CM | POA: Insufficient documentation

## 2013-01-05 DIAGNOSIS — IMO0002 Reserved for concepts with insufficient information to code with codable children: Secondary | ICD-10-CM | POA: Insufficient documentation

## 2013-01-05 DIAGNOSIS — N809 Endometriosis, unspecified: Secondary | ICD-10-CM | POA: Insufficient documentation

## 2013-01-05 DIAGNOSIS — Z01419 Encounter for gynecological examination (general) (routine) without abnormal findings: Secondary | ICD-10-CM

## 2013-01-05 DIAGNOSIS — N803 Endometriosis of pelvic peritoneum, unspecified: Secondary | ICD-10-CM

## 2013-01-05 DIAGNOSIS — Z3049 Encounter for surveillance of other contraceptives: Secondary | ICD-10-CM

## 2013-01-05 MED ORDER — ETONOGESTREL-ETHINYL ESTRADIOL 0.12-0.015 MG/24HR VA RING: VAGINAL_RING | VAGINAL | Status: DC

## 2013-01-05 NOTE — Progress Notes (Signed)
Patient ID: [name] [name], female   DOB: [birth year], 42 y.o.   MRN: [id number]  Chief Complaint  Patient presents with  . Gynecologic Exam    HPI [name] [name] is a 42 y.o. female.  Patient had [place] 5/21 that was normal. She is waiting for withdrawal bleeding after removing Nuvaring 3 days ago.  HPI  Past Medical History  Diagnosis Date  . Asthma   . Endometriosis   . Constipation   . Asthma   . Ulcer   . Arthritis   . History of blood clots     DURING PREGNANCY AND PERIODS  . Heart murmur   . Allergy   . Heart murmur     Past Surgical History  Procedure Laterality Date  . Ankle surgery    . Cesarean section    . Laparoscopy      for endometriosis  . Appendectomy    . Cholecystectomy    . Laparoscopic endometriosis fulguration    . Ankle surgery    . Appendectomy    . Cholecystectomy    . Gallbladder surgery    . Wisdom tooth extraction      x2    Family History  Problem Relation Age of Onset  . COPD Mother   . Emphysema Mother   . Cancer Father     unknown  . Cancer Paternal Aunt     ovarian    Social History History  Substance Use Topics  . Smoking status: Never Smoker   . Smokeless tobacco: Never Used  . Alcohol Use: No    No Known Allergies  Current Outpatient Prescriptions  Medication Sig Dispense Refill  . Albuterol (VENTOLIN IN) Inhale into the lungs.      . Desonide Lot-Moisturizing Crea 0.05 % KIT Apply topically.      Marland Kitchen etonogestrel-ethinyl estradiol (NUVARING) 0.12-0.015 MG/24HR vaginal ring Replace Nuvaring each 4 weeks with no interval without Nuvaring.  1 each  15  . HYDROcodone-acetaminophen (NORCO/VICODIN) 5-325 MG per tablet Take 1 tablet by mouth every 6 (six) hours as needed for pain.  30 tablet  0  . ibuprofen (ADVIL,MOTRIN) 800 MG tablet Take 800 mg by mouth every 8 (eight) hours as needed.      . mometasone (ASMANEX) 220 MCG/INH inhaler Inhale 2 puffs into the lungs daily.       No current facility-administered medications for this  visit.    Review of Systems Review of Systems  Genitourinary: Positive for pelvic pain (cramps). Negative for dysuria, vaginal bleeding, vaginal discharge and menstrual problem.    Blood pressure 124/79, pulse 76, height 5\' 3"  (1.6 m), weight 123 lb 6.4 oz (55.974 kg).  Physical Exam Physical Exam  Nursing note and vitals reviewed. Constitutional: She is oriented to person, place, and time. She appears well-developed and well-nourished. No distress.  HENT:  Head: Normocephalic and atraumatic.  Pulmonary/Chest: Effort normal. No respiratory distress.  Breasts without mass or tenderness, no lymphadenopathy  Abdominal: Soft. She exhibits no mass. There is tenderness.  Genitourinary:  Deferred, normal pelvic 2 weeks go and pap 12/2011  Neurological: She is alert and oriented to person, place, and time.  Skin: Skin is warm and dry.  Psychiatric: She has a normal mood and affect. Her behavior is normal.    Data Reviewed  *RADIOLOGY REPORT*  Clinical Data: Pelvic pain for 2 weeks, left greater than right.  History of endometriosis. No menses, continuous Nuvaring.  TRANSABDOMINAL AND TRANSVAGINAL ULTRASOUND OF PELVIS  Technique: Both transabdominal and transvaginal  ultrasound  examinations of the pelvis were performed. Transabdominal technique  was performed for global imaging of the pelvis including uterus,  ovaries, adnexal regions, and pelvic cul-de-sac.  It was necessary to proceed with endovaginal exam following the  transabdominal exam to visualize the ovaries and adnexa.  Comparison: None  Findings:  Uterus: The uterus is anteverted and measures 7.4 x 4.0 x 5.4 cm.  The uterine myometrium is homogeneous. No focal uterine mass is  identified. There is some fluid in the endocervical canal.  Endometrium: Normal in thickness and appearance. Measures 5 mm  maximal diameter.  Right ovary: Measures 2.1 x 0.8 x 0.7 cm. Normal appearance/no  adnexal mass.  Left ovary: Measures 1.7  x 0.9 x 0.6 cm. Normal appearance/no  adnexal mass  Other findings: No free fluid  IMPRESSION:  Normal study. No evidence of pelvic mass or other significant  abnormality.  Original Report Authenticated By: Britta Mccreedy, M.D.   Assessment    H/O endometriosis, pelvic cramps, uses Nuvaring     Plan    Continue nuvaring  Mammogram Pap every 3-5 years        [name] 01/05/2013, 11:06 AM

## 2013-01-24 ENCOUNTER — Ambulatory Visit: Payer: Self-pay | Admitting: General Practice

## 2013-02-17 ENCOUNTER — Encounter: Payer: Self-pay | Admitting: Gastroenterology

## 2013-05-03 ENCOUNTER — Emergency Department: Payer: Self-pay | Admitting: Emergency Medicine

## 2013-05-09 ENCOUNTER — Encounter: Payer: Self-pay | Admitting: Obstetrics & Gynecology

## 2013-05-09 ENCOUNTER — Ambulatory Visit (INDEPENDENT_AMBULATORY_CARE_PROVIDER_SITE_OTHER): Payer: PRIVATE HEALTH INSURANCE | Admitting: Obstetrics & Gynecology

## 2013-05-09 VITALS — BP 131/79 | HR 97 | Ht 63.0 in | Wt 119.0 lb

## 2013-05-09 DIAGNOSIS — R309 Painful micturition, unspecified: Secondary | ICD-10-CM

## 2013-05-09 DIAGNOSIS — N898 Other specified noninflammatory disorders of vagina: Secondary | ICD-10-CM

## 2013-05-09 DIAGNOSIS — N939 Abnormal uterine and vaginal bleeding, unspecified: Secondary | ICD-10-CM

## 2013-05-09 DIAGNOSIS — Z1231 Encounter for screening mammogram for malignant neoplasm of breast: Secondary | ICD-10-CM

## 2013-05-09 DIAGNOSIS — N23 Unspecified renal colic: Secondary | ICD-10-CM

## 2013-05-09 LAB — POCT URINALYSIS DIPSTICK
Bilirubin, UA: NEGATIVE
Glucose, UA: NEGATIVE
Ketones, UA: NEGATIVE
Nitrite, UA: NEGATIVE
pH, UA: 6

## 2013-05-09 NOTE — Patient Instructions (Signed)
Return to clinic for any scheduled appointments or for any gynecologic concerns as needed.   

## 2013-05-09 NOTE — Progress Notes (Signed)
Patient is having some irregular spotting.  She has been sick with a sinus infection since the 17th of this month.  She also had unprotected sex approximately two weeks ago.  She does use the [employer] continuously for contraception.

## 2013-05-09 NOTE — Progress Notes (Signed)
GYNECOLOGY CLINIC ENCOUNTER NOTE  History:  42 y.o. G1P1 here today for evaluation of one episode of spotting three days ago.  She had unprotected sexual intercourse two weeks ago after three years of abstinence, but does not report having any pain during the encounter. No other symptoms after the sexual encounter. She also has been taking multiple regimens of antibiotics since the 17th of this month secondary to sinus infections. No abnormal discharge.  Patient is on continuous Nuvaring for contraception and treatment of endometriosis.  The following portions of the patient's history were reviewed and updated as appropriate: allergies, current medications, past family history, past medical history, past social history, past surgical history and problem list. Normal pap smear in 12/2011, no history of cervical dysplasia, has pap smears every 1-2 years. On review of records, normal paps since 2008. No mammogram yet.  Review of Systems:  As mentioned in HPI  Objective:  Physical Exam   BP 131/79  Pulse 97  Ht 5\' 3"  (1.6 m)  Wt 119 lb (53.978 kg)  BMI 21.09 kg/m2 Gen: NAD Abd: Soft, nontender and nondistended Pelvic: Normal appearing external genitalia; normal appearing vaginal mucosa and cervix.  Light brown vaginal discharge. GC/Chlam and wet prep samples obtained. Small uterus, no other palpable masses, no uterine or adnexal tenderness  Labs and Imaging UA negative for infection  Assessment & Plan:  Isolated episode of spotting, no other symptoms.  Likely secondary to hormonal fluctuation in the setting of multiple antibiotic use that interfered with the hormonal levels of the Nuvaring.  Could also be anovulatory bleeding in the setting of stress due to illness.  Pregnancy is less likely but patient told to take UPT if spotting continued.  Will follow up cultures to evaluate for infection; safe sex emphasized. Screening mammogram ordered. Routine preventative health maintenance measures  emphasized.

## 2013-05-10 LAB — CULTURE, URINE COMPREHENSIVE: Organism ID, Bacteria: NO GROWTH

## 2013-05-10 LAB — GC/CHLAMYDIA PROBE AMP
CT Probe RNA: NEGATIVE
GC Probe RNA: NEGATIVE

## 2013-05-10 LAB — WET PREP, GENITAL: Clue Cells Wet Prep HPF POC: NONE SEEN

## 2013-06-20 ENCOUNTER — Ambulatory Visit: Payer: Self-pay | Admitting: Neurology

## 2013-06-20 LAB — HCG, QUANTITATIVE, PREGNANCY: Beta Hcg, Quant.: <1 m[IU]/mL — ABNORMAL LOW

## 2013-09-23 ENCOUNTER — Encounter: Payer: Self-pay | Admitting: Obstetrics & Gynecology

## 2013-09-23 ENCOUNTER — Ambulatory Visit (INDEPENDENT_AMBULATORY_CARE_PROVIDER_SITE_OTHER): Payer: PRIVATE HEALTH INSURANCE | Admitting: Obstetrics & Gynecology

## 2013-09-23 VITALS — BP 128/75 | HR 73 | Ht 63.0 in | Wt 124.4 lb

## 2013-09-23 DIAGNOSIS — R1032 Left lower quadrant pain: Secondary | ICD-10-CM

## 2013-09-23 DIAGNOSIS — Z1231 Encounter for screening mammogram for malignant neoplasm of breast: Secondary | ICD-10-CM

## 2013-09-23 DIAGNOSIS — R1031 Right lower quadrant pain: Secondary | ICD-10-CM

## 2013-09-23 NOTE — Patient Instructions (Signed)

## 2013-09-23 NOTE — Progress Notes (Signed)
For two weeks there is a spot in her rear groin area that is sore when she touches it.

## 2013-09-23 NOTE — Progress Notes (Signed)
   Subjective:    Patient ID: [name] [name], female    DOB: May 19, 1971, 43 y.o.   MRN: [id number]  HPI  She complains of a 2 week h/o left groin pain, worse with sitting and putting pressure on this area, for the last 2 weeks. She does remember trying squats about 2 weeks ago.   Review of Systems     Objective:   Physical Exam  There is no visible or palpable abnormality. She feels the tenderness most when I press on the left mid pubic rami. No lymphadenopathy      Assessment & Plan:  Rami pain- rec IBU 800 mg TID for a week If no better, then rec chiropractor RTC 3 months for annual Schedule mammogram She declines a flu accine

## 2013-09-26 ENCOUNTER — Ambulatory Visit (HOSPITAL_COMMUNITY)
Admission: RE | Admit: 2013-09-26 | Discharge: 2013-09-26 | Disposition: A | Discharge status: Home / Self Care | Payer: PRIVATE HEALTH INSURANCE | Source: Ambulatory Visit | Attending: Obstetrics & Gynecology | Admitting: Obstetrics & Gynecology

## 2013-09-26 DIAGNOSIS — Z1231 Encounter for screening mammogram for malignant neoplasm of breast: Secondary | ICD-10-CM | POA: Insufficient documentation

## 2014-01-18 ENCOUNTER — Ambulatory Visit (INDEPENDENT_AMBULATORY_CARE_PROVIDER_SITE_OTHER): Payer: PRIVATE HEALTH INSURANCE | Admitting: Obstetrics & Gynecology

## 2014-01-18 ENCOUNTER — Encounter: Payer: Self-pay | Admitting: Obstetrics & Gynecology

## 2014-01-18 VITALS — BP 136/89 | HR 66 | Ht 63.0 in | Wt 121.0 lb

## 2014-01-18 DIAGNOSIS — Z30018 Encounter for initial prescription of other contraceptives: Secondary | ICD-10-CM

## 2014-01-18 DIAGNOSIS — Z113 Encounter for screening for infections with a predominantly sexual mode of transmission: Secondary | ICD-10-CM

## 2014-01-18 DIAGNOSIS — IMO0002 Reserved for concepts with insufficient information to code with codable children: Secondary | ICD-10-CM

## 2014-01-18 DIAGNOSIS — Z01419 Encounter for gynecological examination (general) (routine) without abnormal findings: Secondary | ICD-10-CM

## 2014-01-18 DIAGNOSIS — N803 Endometriosis of pelvic peritoneum, unspecified: Secondary | ICD-10-CM

## 2014-01-18 DIAGNOSIS — Z124 Encounter for screening for malignant neoplasm of cervix: Secondary | ICD-10-CM

## 2014-01-18 DIAGNOSIS — Z3049 Encounter for surveillance of other contraceptives: Secondary | ICD-10-CM

## 2014-01-18 DIAGNOSIS — Z1151 Encounter for screening for human papillomavirus (HPV): Secondary | ICD-10-CM

## 2014-01-18 DIAGNOSIS — Z0000 Encounter for general adult medical examination without abnormal findings: Secondary | ICD-10-CM

## 2014-01-18 MED ORDER — ETONOGESTREL-ETHINYL ESTRADIOL 0.12-0.015 MG/24HR VA RING: VAGINAL_RING | VAGINAL | Status: DC

## 2014-01-18 NOTE — Progress Notes (Signed)
    GYNECOLOGY CLINIC ANNUAL PREVENTATIVE CARE ENCOUNTER NOTE  Subjective:     [name] [name] is a 42 y.o. G1P1 female here for a routine annual gynecologic exam.  Current complaints: none.  Has endometriosis, is on continuous Nuvaring that manages her symptoms.   Gynecologic History No LMP recorded. Patient is not currently having periods (Reason: Other). Contraception: Nuvaring Last Pap: 12/30/2011. Results were: normal Last mammogram: 09/26/13. Results were: normal  Obstetric History OB History  Gravida Para Term Preterm AB SAB TAB Ectopic Multiple Living  1 1        1     # Outcome Date GA Lbr Len/2nd Weight Sex Delivery Anes PTL Lv  1 PAR 1995    M LTCS   Y     The following portions of the patient's history were reviewed and updated as appropriate: allergies, current medications, past family history, past medical history, past social history, past surgical history and problem list.  Review of Systems Pertinent items are noted in HPI.    Objective:   BP 136/89  Pulse 66  Ht 5\' 3"  (1.6 m)  Wt 121 lb (54.885 kg)  BMI 21.44 kg/m2 GENERAL: Well-developed, well-nourished female in no acute distress.  HEENT: Normocephalic, atraumatic. Sclerae anicteric.  NECK: Supple. Normal thyroid.  LUNGS: Clear to auscultation bilaterally.  HEART: Regular rate and rhythm. BREASTS: Symmetric in size. No masses, skin changes, nipple drainage, or lymphadenopathy. ABDOMEN: Soft, nontender, nondistended. No organomegaly. PELVIC: Normal external female genitalia. Vagina is pink and rugated.Nuvaring in place.  Normal discharge. Normal cervix contour. Pap smear obtained. Uterus is normal in size. No adnexal mass or tenderness.  EXTREMITIES: No cyanosis, clubbing, or edema, 2+ distal pulses.   Assessment:   Annual gynecologic examination   Plan:   Pap smear done, other ancillary testing and routine preventative health maintenance labs also checked. Will follow up results and manage  accordingly. Nuvaring refilled for endometriosis. Routine preventative health maintenance measures emphasized   Jaynie Collins, MD, FACOG Attending Obstetrician & Gynecologist Faculty Practice, [facility] [place] '

## 2014-01-18 NOTE — Patient Instructions (Signed)
Thank you for enrolling in [facility]. Please follow the instructions below to securely access your online medical record. MyChart allows you to send messages to your doctor, view your test results, manage appointments, and more.   How Do I Sign Up? 1. In your Internet browser, go to [employer] and enter [email or web address]. 2. Click on the Sign Up Now link in the Sign In box. You will see the New Member Sign Up page. 3. Enter your MyChart Access Code exactly as it appears below. You will not need to use this code after you've completed the sign-up process. If you do not sign up before the expiration date, you must request a new code.  MyChart Access Code: [email or web address] Expires: 03/19/2014  9:26 AM  4. Enter your Social Security Number ([id number]) and Date of Birth (mm/dd/yyyy) as indicated and click Submit. You will be taken to the next sign-up page. 5. Create a MyChart ID. This will be your MyChart login ID and cannot be changed, so think of one that is secure and easy to remember. 6. Create a MyChart password. You can change your password at any time. 7. Enter your Password Reset Question and Answer. This can be used at a later time if you forget your password.  8. Enter your e-mail address. You will receive e-mail notification when new information is available in [facility]. 9. Click Sign Up. You can now view your medical record.   Additional Information Remember, MyChart is NOT to be used for urgent needs. For medical emergencies, dial 911.     Preventive Care for Adults, Female A healthy lifestyle and preventive care can promote health and wellness. Preventive health guidelines for women include the following key practices.  A routine yearly physical is a good way to check with your health care provider about your health and preventive screening. It is a chance to share any concerns and updates on your health and to receive a thorough exam.  Visit your dentist for a  routine exam and preventive care every 6 months. Brush your teeth twice a day and floss once a day. Good oral hygiene prevents tooth decay and gum disease.  The frequency of eye exams is based on your age, health, family medical history, use of contact lenses, and other factors. Follow your health care provider's recommendations for frequency of eye exams.  Eat a healthy diet. Foods like vegetables, fruits, whole grains, low-fat dairy products, and lean protein foods contain the nutrients you need without too many calories. Decrease your intake of foods high in solid fats, added sugars, and salt. Eat the right amount of calories for you.Get information about a proper diet from your health care provider, if necessary.  Regular physical exercise is one of the most important things you can do for your health. Most adults should get at least 150 minutes of moderate-intensity exercise (any activity that increases your heart rate and causes you to sweat) each week. In addition, most adults need muscle-strengthening exercises on 2 or more days a week.  Maintain a healthy weight. The body mass index (BMI) is a screening tool to identify possible weight problems. It provides an estimate of body fat based on height and weight. Your health care provider can find your BMI, and can help you achieve or maintain a healthy weight.For adults 20 years and older:  A BMI below 18.5 is considered underweight.  A BMI of 18.5 to 24.9 is normal.  A BMI of 25 to 29.9 is considered overweight.  A BMI of 30 and above is considered obese.  Maintain normal blood lipids and cholesterol levels by exercising and minimizing your intake of saturated fat. Eat a balanced diet with plenty of fruit and vegetables. Blood tests for lipids and cholesterol should begin at age 35 and be repeated every 5 years. If your lipid or cholesterol levels are high, you are over 50, or you are at high risk for heart disease, you may need your  cholesterol levels checked more frequently.Ongoing high lipid and cholesterol levels should be treated with medicines if diet and exercise are not working.  If you smoke, find out from your health care provider how to quit. If you do not use tobacco, do not start.  Lung cancer screening is recommended for adults aged 64 80 years who are at high risk for developing lung cancer because of a history of smoking. A yearly low-dose CT scan of the lungs is recommended for people who have at least a 30-pack-year history of smoking and are a current smoker or have quit within the past 15 years. A pack year of smoking is smoking an average of 1 pack of cigarettes a day for 1 year (for example: 1 pack a day for 30 years or 2 packs a day for 15 years). Yearly screening should continue until the smoker has stopped smoking for at least 15 years. Yearly screening should be stopped for people who develop a health problem that would prevent them from having lung cancer treatment.  If you are pregnant, do not drink alcohol. If you are breastfeeding, be very cautious about drinking alcohol. If you are not pregnant and choose to drink alcohol, do not have more than 1 drink per day. One drink is considered to be 12 ounces (355 mL) of beer, 5 ounces (148 mL) of wine, or 1.5 ounces (44 mL) of liquor.  Avoid use of street drugs. Do not share needles with anyone. Ask for help if you need support or instructions about stopping the use of drugs.  High blood pressure causes heart disease and increases the risk of stroke. Your blood pressure should be checked at least every 1 to 2 years. Ongoing high blood pressure should be treated with medicines if weight loss and exercise do not work.  If you are 60 43 years old, ask your health care provider if you should take aspirin to prevent strokes.  Diabetes screening involves taking a blood sample to check your fasting blood sugar level. This should be done once every 3 years, after  age 64, if you are within normal weight and without risk factors for diabetes. Testing should be considered at a younger age or be carried out more frequently if you are overweight and have at least 1 risk factor for diabetes.  Breast cancer screening is essential preventive care for women. You should practice "breast self-awareness." This means understanding the normal appearance and feel of your breasts and may include breast self-examination. Any changes detected, no matter how small, should be reported to a health care provider. Women in their 63s and 30s should have a clinical breast exam (CBE) by a health care provider as part of a regular health exam every 1 to 3 years. After age 85, women should have a CBE every year. Starting at age 56, women should consider having a mammogram (breast X-ray test) every year. Women who have a family history of breast cancer should talk to their health care provider about genetic screening. Women at a  high risk of breast cancer should talk to their health care providers about having an MRI and a mammogram every year.  Breast cancer gene (BRCA)-related cancer risk assessment is recommended for women who have family members with BRCA-related cancers. BRCA-related cancers include breast, ovarian, tubal, and peritoneal cancers. Having family members with these cancers may be associated with an increased risk for harmful changes (mutations) in the breast cancer genes BRCA1 and BRCA2. Results of the assessment will determine the need for genetic counseling and BRCA1 and BRCA2 testing.  The Pap test is a screening test for cervical cancer. A Pap test can show cell changes on the cervix that might become cervical cancer if left untreated. A Pap test is a procedure in which cells are obtained and examined from the lower end of the uterus (cervix).  Women should have a Pap test starting at age 60.  Between ages 18 and 20, Pap tests should be repeated every 2  years.  Beginning at age 98, you should have a Pap test every 3 years as long as the past 3 Pap tests have been normal.  Some women have medical problems that increase the chance of getting cervical cancer. Talk to your health care provider about these problems. It is especially important to talk to your health care provider if a new problem develops soon after your last Pap test. In these cases, your health care provider may recommend more frequent screening and Pap tests.  The above recommendations are the same for women who have or have not gotten the vaccine for human papillomavirus (HPV).  If you had a hysterectomy for a problem that was not cancer or a condition that could lead to cancer, then you no longer need Pap tests. Even if you no longer need a Pap test, a regular exam is a good idea to make sure no other problems are starting.  If you are between ages 69 and 20 years, and you have had normal Pap tests going back 10 years, you no longer need Pap tests. Even if you no longer need a Pap test, a regular exam is a good idea to make sure no other problems are starting.  If you have had past treatment for cervical cancer or a condition that could lead to cancer, you need Pap tests and screening for cancer for at least 20 years after your treatment.  If Pap tests have been discontinued, risk factors (such as a new sexual partner) need to be reassessed to determine if screening should be resumed.  The HPV test is an additional test that may be used for cervical cancer screening. The HPV test looks for the virus that can cause the cell changes on the cervix. The cells collected during the Pap test can be tested for HPV. The HPV test could be used to screen women aged 32 years and older, and should be used in women of any age who have unclear Pap test results. After the age of 70, women should have HPV testing at the same frequency as a Pap test.  Colorectal cancer can be detected and often  prevented. Most routine colorectal cancer screening begins at the age of 25 years and continues through age 81 years. However, your health care provider may recommend screening at an earlier age if you have risk factors for colon cancer. On a yearly basis, your health care provider may provide home test kits to check for hidden blood in the stool. Use of a small camera  at the end of a tube, to directly examine the colon (sigmoidoscopy or colonoscopy), can detect the earliest forms of colorectal cancer. Talk to your health care provider about this at age 22, when routine screening begins. Direct exam of the colon should be repeated every 5 10 years through age 81 years, unless early forms of pre-cancerous polyps or small growths are found.  People who are at an increased risk for hepatitis B should be screened for this virus. You are considered at high risk for hepatitis B if:  You were born in a country where hepatitis B occurs often. Talk with your health care provider about which countries are considered high risk.  Your parents were born in a high-risk country and you have not received a shot to protect against hepatitis B (hepatitis B vaccine).  You have HIV or AIDS.  You use needles to inject street drugs.  You live with, or have sex with, someone who has Hepatitis B.  You get hemodialysis treatment.  You take certain medicines for conditions like cancer, organ transplantation, and autoimmune conditions.  Hepatitis C blood testing is recommended for all people born from [birth year] through 1965 and any individual with known risks for hepatitis C.  Practice safe sex. Use condoms and avoid high-risk sexual practices to reduce the spread of sexually transmitted infections (STIs). STIs include gonorrhea, chlamydia, syphilis, trichomonas, herpes, HPV, and human immunodeficiency virus (HIV). Herpes, HIV, and HPV are viral illnesses that have no cure. They can result in disability, cancer, and death.  Sexually active women aged 43 years and younger should be checked for chlamydia. Older women with new or multiple partners should also be tested for chlamydia. Testing for other STIs is recommended if you are sexually active and at increased risk.  Osteoporosis is a disease in which the bones lose minerals and strength with aging. This can result in serious bone fractures or breaks. The risk of osteoporosis can be identified using a bone density scan. Women ages 19 years and over and women at risk for fractures or osteoporosis should discuss screening with their health care providers. Ask your health care provider whether you should take a calcium supplement or vitamin D to reduce the rate of osteoporosis.  Menopause can be associated with physical symptoms and risks. Hormone replacement therapy is available to decrease symptoms and risks. You should talk to your health care provider about whether hormone replacement therapy is right for you.  Use sunscreen. Apply sunscreen liberally and repeatedly throughout the day. You should seek shade when your shadow is shorter than you. Protect yourself by wearing long sleeves, pants, a wide-brimmed hat, and sunglasses year round, whenever you are outdoors.  Once a month, do a whole body skin exam, using a mirror to look at the skin on your back. Tell your health care provider of new moles, moles that have irregular borders, moles that are larger than a pencil eraser, or moles that have changed in shape or color.  Stay current with required vaccines (immunizations).  Influenza vaccine. All adults should be immunized every year.  Tetanus, diphtheria, and acellular pertussis (Td, Tdap) vaccine. Pregnant women should receive 1 dose of Tdap vaccine during each pregnancy. The dose should be obtained regardless of the length of time since the last dose. Immunization is preferred during the 27th 36th week of gestation. An adult who has not previously received Tdap or  who does not know her vaccine status should receive 1 dose of Tdap. This initial dose  should be followed by tetanus and diphtheria toxoids (Td) booster doses every 10 years. Adults with an unknown or incomplete history of completing a 3-dose immunization series with Td-containing vaccines should begin or complete a primary immunization series including a Tdap dose. Adults should receive a Td booster every 10 years.  Varicella vaccine. An adult without evidence of immunity to varicella should receive 2 doses or a second dose if she has previously received 1 dose. Pregnant females who do not have evidence of immunity should receive the first dose after pregnancy. This first dose should be obtained before leaving the health care facility. The second dose should be obtained 4 8 weeks after the first dose.  Human papillomavirus (HPV) vaccine. Females aged 16 26 years who have not received the vaccine previously should obtain the 3-dose series. The vaccine is not recommended for use in pregnant females. However, pregnancy testing is not needed before receiving a dose. If a female is found to be pregnant after receiving a dose, no treatment is needed. In that case, the remaining doses should be delayed until after the pregnancy. Immunization is recommended for any person with an immunocompromised condition through the age of 20 years if she did not get any or all doses earlier. During the 3-dose series, the second dose should be obtained 4 8 weeks after the first dose. The third dose should be obtained 24 weeks after the first dose and 16 weeks after the second dose.  Zoster vaccine. One dose is recommended for adults aged 25 years or older unless certain conditions are present.  Measles, mumps, and rubella (MMR) vaccine. Adults born before [birth year] generally are considered immune to measles and mumps. Adults born in [birth year] or later should have 1 or more doses of MMR vaccine unless there is a contraindication to the  vaccine or there is laboratory evidence of immunity to each of the three diseases. A routine second dose of MMR vaccine should be obtained at least 28 days after the first dose for students attending postsecondary schools, health care workers, or international travelers. People who received inactivated measles vaccine or an unknown type of measles vaccine during 1963 1967 should receive 2 doses of MMR vaccine. People who received inactivated mumps vaccine or an unknown type of mumps vaccine before 1979 and are at high risk for mumps infection should consider immunization with 2 doses of MMR vaccine. For females of childbearing age, rubella immunity should be determined. If there is no evidence of immunity, females who are not pregnant should be vaccinated. If there is no evidence of immunity, females who are pregnant should delay immunization until after pregnancy. Unvaccinated health care workers born before [birth year] who lack laboratory evidence of measles, mumps, or rubella immunity or laboratory confirmation of disease should consider measles and mumps immunization with 2 doses of MMR vaccine or rubella immunization with 1 dose of MMR vaccine.  Pneumococcal 13-valent conjugate (PCV13) vaccine. When indicated, a person who is uncertain of her immunization history and has no record of immunization should receive the PCV13 vaccine. An adult aged 43 years or older who has certain medical conditions and has not been previously immunized should receive 1 dose of PCV13 vaccine. This PCV13 should be followed with a dose of pneumococcal polysaccharide (PPSV23) vaccine. The PPSV23 vaccine dose should be obtained at least 8 weeks after the dose of PCV13 vaccine. An adult aged 22 years or older who has certain medical conditions and previously received 1 or more doses of PPSV23 vaccine  should receive 1 dose of PCV13. The PCV13 vaccine dose should be obtained 1 or more years after the last PPSV23 vaccine dose.  Pneumococcal  polysaccharide (PPSV23) vaccine. When PCV13 is also indicated, PCV13 should be obtained first. All adults aged 24 years and older should be immunized. An adult younger than age 37 years who has certain medical conditions should be immunized. Any person who resides in a nursing home or long-term care facility should be immunized. An adult smoker should be immunized. People with an immunocompromised condition and certain other conditions should receive both PCV13 and PPSV23 vaccines. People with human immunodeficiency virus (HIV) infection should be immunized as soon as possible after diagnosis. Immunization during chemotherapy or radiation therapy should be avoided. Routine use of PPSV23 vaccine is not recommended for American Indians, [place] Natives, or people younger than 65 years unless there are medical conditions that require PPSV23 vaccine. When indicated, people who have unknown immunization and have no record of immunization should receive PPSV23 vaccine. One-time revaccination 5 years after the first dose of PPSV23 is recommended for people aged 58 64 years who have chronic kidney failure, nephrotic syndrome, asplenia, or immunocompromised conditions. People who received 1 2 doses of PPSV23 before age 38 years should receive another dose of PPSV23 vaccine at age 72 years or later if at least 5 years have passed since the previous dose. Doses of PPSV23 are not needed for people immunized with PPSV23 at or after age 62 years.  Meningococcal vaccine. Adults with asplenia or persistent complement component deficiencies should receive 2 doses of quadrivalent meningococcal conjugate (MenACWY-D) vaccine. The doses should be obtained at least 2 months apart. Microbiologists working with certain meningococcal bacteria, [employer] recruits, people at risk during an outbreak, and people who travel to or live in countries with a high rate of meningitis should be immunized. A first-year college student up through age 88  years who is living in a residence hall should receive a dose if she did not receive a dose on or after her 16th birthday. Adults who have certain high-risk conditions should receive one or more doses of vaccine.  Hepatitis A vaccine. Adults who wish to be protected from this disease, have certain high-risk conditions, work with hepatitis A-infected animals, work in hepatitis A research labs, or travel to or work in countries with a high rate of hepatitis A should be immunized. Adults who were previously unvaccinated and who anticipate close contact with an international adoptee during the first 60 days after arrival in the [place] from a country with a high rate of hepatitis A should be immunized.  Hepatitis B vaccine. Adults who wish to be protected from this disease, have certain high-risk conditions, may be exposed to blood or other infectious body fluids, are household contacts or sex partners of hepatitis B positive people, are clients or workers in certain care facilities, or travel to or work in countries with a high rate of hepatitis B should be immunized.  Haemophilus influenzae type b (Hib) vaccine. A previously unvaccinated person with asplenia or sickle cell disease or having a scheduled splenectomy should receive 1 dose of Hib vaccine. Regardless of previous immunization, a recipient of a hematopoietic stem cell transplant should receive a 3-dose series 6 12 months after her successful transplant. Hib vaccine is not recommended for adults with HIV infection. Preventive Services / Frequency Ages 40 to 39years  Blood pressure check.** / Every 1 to 2 years.  Lipid and cholesterol check.** / Every 5 years  beginning at age 75.  Clinical breast exam.** / Every 3 years for women in their 56s and 58s.  BRCA-related cancer risk assessment.** / For women who have family members with a BRCA-related cancer (breast, ovarian, tubal, or peritoneal cancers).  Pap test.** / Every 2 years from  ages 43 through 16. Every 3 years starting at age 1 through age 98 or 29 with a history of 3 consecutive normal Pap tests.  HPV screening.** / Every 3 years from ages 25 through ages 48 to 43 with a history of 3 consecutive normal Pap tests.  Hepatitis C blood test.** / For any individual with known risks for hepatitis C.  Skin self-exam. / Monthly.  Influenza vaccine. / Every year.  Tetanus, diphtheria, and acellular pertussis (Tdap, Td) vaccine.** / Consult your health care provider. Pregnant women should receive 1 dose of Tdap vaccine during each pregnancy. 1 dose of Td every 10 years.  Varicella vaccine.** / Consult your health care provider. Pregnant females who do not have evidence of immunity should receive the first dose after pregnancy.  HPV vaccine. / 3 doses over 6 months, if 51 and younger. The vaccine is not recommended for use in pregnant females. However, pregnancy testing is not needed before receiving a dose.  Measles, mumps, rubella (MMR) vaccine.** / You need at least 1 dose of MMR if you were born in 1957 or later. You may also need a 2nd dose. For females of childbearing age, rubella immunity should be determined. If there is no evidence of immunity, females who are not pregnant should be vaccinated. If there is no evidence of immunity, females who are pregnant should delay immunization until after pregnancy.  Pneumococcal 13-valent conjugate (PCV13) vaccine.** / Consult your health care provider.  Pneumococcal polysaccharide (PPSV23) vaccine.** / 1 to 2 doses if you smoke cigarettes or if you have certain conditions.  Meningococcal vaccine.** / 1 dose if you are age 77 to 62 years and a Market researcher living in a residence hall, or have one of several medical conditions, you need to get vaccinated against meningococcal disease. You may also need additional booster doses.  Hepatitis A vaccine.** / Consult your health care provider.  Hepatitis B vaccine.** /  Consult your health care provider.  Haemophilus influenzae type b (Hib) vaccine.** / Consult your health care provider. Ages 35 to 64years  Blood pressure check.** / Every 1 to 2 years.  Lipid and cholesterol check.** / Every 5 years beginning at age 64 years.  Lung cancer screening. / Every year if you are aged 76 80 years and have a 30-pack-year history of smoking and currently smoke or have quit within the past 15 years. Yearly screening is stopped once you have quit smoking for at least 15 years or develop a health problem that would prevent you from having lung cancer treatment.  Clinical breast exam.** / Every year after age 7 years.  BRCA-related cancer risk assessment.** / For women who have family members with a BRCA-related cancer (breast, ovarian, tubal, or peritoneal cancers).  Mammogram.** / Every year beginning at age 43 years and continuing for as long as you are in good health. Consult with your health care provider.  Pap test.** / Every 3 years starting at age 52 years through age 74 or 58 years with a history of 3 consecutive normal Pap tests.  HPV screening.** / Every 3 years from ages 34 years through ages 1 to 70 years with a history of 3 consecutive normal  Pap tests.  Fecal occult blood test (FOBT) of stool. / Every year beginning at age 83 years and continuing until age 49 years. You may not need to do this test if you get a colonoscopy every 10 years.  Flexible sigmoidoscopy or colonoscopy.** / Every 5 years for a flexible sigmoidoscopy or every 10 years for a colonoscopy beginning at age 53 years and continuing until age 57 years.  Hepatitis C blood test.** / For all people born from [birth year] through 1965 and any individual with known risks for hepatitis C.  Skin self-exam. / Monthly.  Influenza vaccine. / Every year.  Tetanus, diphtheria, and acellular pertussis (Tdap/Td) vaccine.** / Consult your health care provider. Pregnant women should receive 1 dose of  Tdap vaccine during each pregnancy. 1 dose of Td every 10 years.  Varicella vaccine.** / Consult your health care provider. Pregnant females who do not have evidence of immunity should receive the first dose after pregnancy.  Zoster vaccine.** / 1 dose for adults aged 3 years or older.  Measles, mumps, rubella (MMR) vaccine.** / You need at least 1 dose of MMR if you were born in 1957 or later. You may also need a 2nd dose. For females of childbearing age, rubella immunity should be determined. If there is no evidence of immunity, females who are not pregnant should be vaccinated. If there is no evidence of immunity, females who are pregnant should delay immunization until after pregnancy.  Pneumococcal 13-valent conjugate (PCV13) vaccine.** / Consult your health care provider.  Pneumococcal polysaccharide (PPSV23) vaccine.** / 1 to 2 doses if you smoke cigarettes or if you have certain conditions.  Meningococcal vaccine.** / Consult your health care provider.  Hepatitis A vaccine.** / Consult your health care provider.  Hepatitis B vaccine.** / Consult your health care provider.  Haemophilus influenzae type b (Hib) vaccine.** / Consult your health care provider. Ages 21 years and over  Blood pressure check.** / Every 1 to 2 years.  Lipid and cholesterol check.** / Every 5 years beginning at age 78 years.  Lung cancer screening. / Every year if you are aged 65 80 years and have a 30-pack-year history of smoking and currently smoke or have quit within the past 15 years. Yearly screening is stopped once you have quit smoking for at least 15 years or develop a health problem that would prevent you from having lung cancer treatment.  Clinical breast exam.** / Every year after age 30 years.  BRCA-related cancer risk assessment.** / For women who have family members with a BRCA-related cancer (breast, ovarian, tubal, or peritoneal cancers).  Mammogram.** / Every year beginning at age 59  years and continuing for as long as you are in good health. Consult with your health care provider.  Pap test.** / Every 3 years starting at age 48 years through age 63 or 98 years with 3 consecutive normal Pap tests. Testing can be stopped between 65 and 70 years with 3 consecutive normal Pap tests and no abnormal Pap or HPV tests in the past 10 years.  HPV screening.** / Every 3 years from ages 78 years through ages 76 or 16 years with a history of 3 consecutive normal Pap tests. Testing can be stopped between 65 and 70 years with 3 consecutive normal Pap tests and no abnormal Pap or HPV tests in the past 10 years.  Fecal occult blood test (FOBT) of stool. / Every year beginning at age 84 years and continuing until age 44 years. You  may not need to do this test if you get a colonoscopy every 10 years.  Flexible sigmoidoscopy or colonoscopy.** / Every 5 years for a flexible sigmoidoscopy or every 10 years for a colonoscopy beginning at age 81 years and continuing until age 29 years.  Hepatitis C blood test.** / For all people born from [birth year] through 1965 and any individual with known risks for hepatitis C.  Osteoporosis screening.** / A one-time screening for women ages 61 years and over and women at risk for fractures or osteoporosis.  Skin self-exam. / Monthly.  Influenza vaccine. / Every year.  Tetanus, diphtheria, and acellular pertussis (Tdap/Td) vaccine.** / 1 dose of Td every 10 years.  Varicella vaccine.** / Consult your health care provider.  Zoster vaccine.** / 1 dose for adults aged 48 years or older.  Pneumococcal 13-valent conjugate (PCV13) vaccine.** / Consult your health care provider.  Pneumococcal polysaccharide (PPSV23) vaccine.** / 1 dose for all adults aged 71 years and older.  Meningococcal vaccine.** / Consult your health care provider.  Hepatitis A vaccine.** / Consult your health care provider.  Hepatitis B vaccine.** / Consult your health care  provider.  Haemophilus influenzae type b (Hib) vaccine.** / Consult your health care provider. ** Family history and personal history of risk and conditions may change your health care provider's recommendations. Document Released: 09/23/2001 Document Revised: 05/18/2013 Document Reviewed: 12/23/2010 [facility] Patient Information 2014 [place], [place].

## 2014-01-19 LAB — CBC
HEMATOCRIT: 41.5 % (ref 36.0–46.0)
Hemoglobin: 14.2 g/dL (ref 12.0–15.0)
MCH: 29 pg (ref 26.0–34.0)
MCHC: 34.2 g/dL (ref 30.0–36.0)
MCV: 84.9 fL (ref 78.0–100.0)
PLATELETS: 304 10*3/uL (ref 150–400)
RBC: 4.89 MIL/uL (ref 3.87–5.11)
RDW: 14 % (ref 11.5–15.5)
WBC: 6.1 10*3/uL (ref 4.0–10.5)

## 2014-01-19 LAB — COMPREHENSIVE METABOLIC PANEL
ALBUMIN: 3.8 g/dL (ref 3.5–5.2)
AST: 10 U/L (ref 0–37)
Alkaline Phosphatase: 53 U/L (ref 39–117)
BUN: 10 mg/dL (ref 6–23)
CALCIUM: 8.9 mg/dL (ref 8.4–10.5)
CHLORIDE: 108 meq/L (ref 96–112)
CO2: 22 mEq/L (ref 19–32)
Creat: 0.6 mg/dL (ref 0.50–1.10)
Glucose, Bld: 85 mg/dL (ref 70–99)
POTASSIUM: 4.9 meq/L (ref 3.5–5.3)
Sodium: 139 mEq/L (ref 135–145)
TOTAL PROTEIN: 6.5 g/dL (ref 6.0–8.3)
Total Bilirubin: 0.5 mg/dL (ref 0.2–1.2)

## 2014-01-19 LAB — HEPATITIS C ANTIBODY: HCV Ab: NEGATIVE

## 2014-01-19 LAB — LIPID PANEL
Cholesterol: 155 mg/dL (ref 0–200)
HDL: 70 mg/dL (ref >39)
LDL CALC: 68 mg/dL (ref 0–99)
TRIGLYCERIDES: 87 mg/dL (ref <150)
Total CHOL/HDL Ratio: 2.2 Ratio
VLDL: 17 mg/dL (ref 0–40)

## 2014-01-19 LAB — HIV ANTIBODY (ROUTINE TESTING W REFLEX): HIV 1&2 Ab, 4th Generation: NONREACTIVE

## 2014-01-19 LAB — TSH: TSH: 1.52 u[IU]/mL (ref 0.350–4.500)

## 2014-01-19 LAB — RPR

## 2014-01-23 LAB — CYTOLOGY - PAP

## 2014-01-31 ENCOUNTER — Telehealth: Payer: Self-pay | Admitting: *Deleted

## 2014-01-31 DIAGNOSIS — B379 Candidiasis, unspecified: Secondary | ICD-10-CM

## 2014-01-31 MED ORDER — FLUCONAZOLE 150 MG PO TABS: 150.0000 mg | ORAL_TABLET | Freq: Once | ORAL | Status: DC

## 2014-01-31 NOTE — Telephone Encounter (Signed)
Patient is having a yeast infection and needs diflucan called in for her.

## 2014-02-13 ENCOUNTER — Telehealth: Payer: Self-pay | Admitting: *Deleted

## 2014-02-13 DIAGNOSIS — Z7251 High risk heterosexual behavior: Secondary | ICD-10-CM

## 2014-02-13 MED ORDER — ULIPRISTAL ACETATE 30 MG PO TABS: 1.0000 | ORAL_TABLET | Freq: Once | ORAL | Status: DC

## 2014-02-13 NOTE — Telephone Encounter (Signed)
Patient called and requested "the morning after pill".  I have sent in this pill to her pharmacy.  Patient aware.

## 2014-03-09 ENCOUNTER — Encounter: Payer: Self-pay | Admitting: Gastroenterology

## 2014-05-22 ENCOUNTER — Other Ambulatory Visit (INDEPENDENT_AMBULATORY_CARE_PROVIDER_SITE_OTHER): Payer: PRIVATE HEALTH INSURANCE | Admitting: *Deleted

## 2014-05-22 DIAGNOSIS — R319 Hematuria, unspecified: Secondary | ICD-10-CM

## 2014-05-22 DIAGNOSIS — Z3202 Encounter for pregnancy test, result negative: Secondary | ICD-10-CM

## 2014-05-22 DIAGNOSIS — R350 Frequency of micturition: Secondary | ICD-10-CM

## 2014-05-22 NOTE — Progress Notes (Signed)
Patient is having increased symptoms of nausea, "butterflys" and increased frequency in urination.  She would like a pregnancy test since she uses the Nuva Ring continuously and does not have a cycle.  Urine pregnancy test is negative and urine dip is positive for blood only.  I will send the urine for culture and patient will watch symptoms and follow up with primary care if they persist if the culture is negative.

## 2014-05-24 LAB — CULTURE, URINE COMPREHENSIVE
Colony Count: NO GROWTH
ORGANISM ID, BACTERIA: NO GROWTH

## 2014-05-26 ENCOUNTER — Other Ambulatory Visit: Payer: Self-pay

## 2014-06-12 ENCOUNTER — Encounter: Payer: Self-pay | Admitting: Obstetrics & Gynecology

## 2014-11-22 ENCOUNTER — Telehealth: Payer: Self-pay | Admitting: Obstetrics & Gynecology

## 2014-11-22 DIAGNOSIS — B379 Candidiasis, unspecified: Secondary | ICD-10-CM

## 2014-11-22 MED ORDER — FLUCONAZOLE 150 MG PO TABS: ORAL_TABLET | ORAL | Status: DC

## 2014-11-22 NOTE — Telephone Encounter (Signed)
Patient called requesting a refill of her Diflucan.  Patient states she has another yeast infection.  Rx called in per protocol.  Patient instructed to follow-up if symptoms persist.

## 2015-01-23 ENCOUNTER — Ambulatory Visit (INDEPENDENT_AMBULATORY_CARE_PROVIDER_SITE_OTHER): Payer: PRIVATE HEALTH INSURANCE | Admitting: Family Medicine

## 2015-01-23 ENCOUNTER — Encounter: Payer: Self-pay | Admitting: Family Medicine

## 2015-01-23 VITALS — BP 130/89 | HR 73 | Ht 63.0 in | Wt 135.0 lb

## 2015-01-23 DIAGNOSIS — Z124 Encounter for screening for malignant neoplasm of cervix: Secondary | ICD-10-CM | POA: Diagnosis not present

## 2015-01-23 DIAGNOSIS — IMO0002 Reserved for concepts with insufficient information to code with codable children: Secondary | ICD-10-CM

## 2015-01-23 DIAGNOSIS — N803 Endometriosis of pelvic peritoneum: Secondary | ICD-10-CM

## 2015-01-23 DIAGNOSIS — Z113 Encounter for screening for infections with a predominantly sexual mode of transmission: Secondary | ICD-10-CM | POA: Diagnosis not present

## 2015-01-23 DIAGNOSIS — Z1151 Encounter for screening for human papillomavirus (HPV): Secondary | ICD-10-CM

## 2015-01-23 DIAGNOSIS — Z01419 Encounter for gynecological examination (general) (routine) without abnormal findings: Secondary | ICD-10-CM | POA: Diagnosis not present

## 2015-01-23 LAB — COMPREHENSIVE METABOLIC PANEL
ALBUMIN: 3.7 g/dL (ref 3.5–5.2)
ALT: 11 U/L (ref 0–35)
AST: 14 U/L (ref 0–37)
Alkaline Phosphatase: 62 U/L (ref 39–117)
BUN: 11 mg/dL (ref 6–23)
CO2: 23 meq/L (ref 19–32)
Calcium: 8.7 mg/dL (ref 8.4–10.5)
Chloride: 107 mEq/L (ref 96–112)
Creat: 0.64 mg/dL (ref 0.50–1.10)
GLUCOSE: 75 mg/dL (ref 70–99)
POTASSIUM: 3.9 meq/L (ref 3.5–5.3)
Sodium: 138 mEq/L (ref 135–145)
Total Bilirubin: 0.6 mg/dL (ref 0.2–1.2)
Total Protein: 6.8 g/dL (ref 6.0–8.3)

## 2015-01-23 LAB — LIPID PANEL
Cholesterol: 169 mg/dL (ref 0–200)
HDL: 69 mg/dL (ref >=46)
LDL Cholesterol: 81 mg/dL (ref 0–99)
TRIGLYCERIDES: 96 mg/dL (ref <150)
Total CHOL/HDL Ratio: 2.4 Ratio
VLDL: 19 mg/dL (ref 0–40)

## 2015-01-23 LAB — CBC
HEMATOCRIT: 40.3 % (ref 36.0–46.0)
HEMOGLOBIN: 13.2 g/dL (ref 12.0–15.0)
MCH: 28.3 pg (ref 26.0–34.0)
MCHC: 32.8 g/dL (ref 30.0–36.0)
MCV: 86.3 fL (ref 78.0–100.0)
MPV: 11.3 fL (ref 8.6–12.4)
Platelets: 293 10*3/uL (ref 150–400)
RBC: 4.67 MIL/uL (ref 3.87–5.11)
RDW: 14.3 % (ref 11.5–15.5)
WBC: 6.2 10*3/uL (ref 4.0–10.5)

## 2015-01-23 LAB — TSH: TSH: 1.432 u[IU]/mL (ref 0.350–4.500)

## 2015-01-23 MED ORDER — ETONOGESTREL-ETHINYL ESTRADIOL 0.12-0.015 MG/24HR VA RING: VAGINAL_RING | VAGINAL | Status: DC

## 2015-01-23 NOTE — Patient Instructions (Signed)
Preventive Care for Adults A healthy lifestyle and preventive care can promote health and wellness. Preventive health guidelines for women include the following key practices.  A routine yearly physical is a good way to check with your health care provider about your health and preventive screening. It is a chance to share any concerns and updates on your health and to receive a thorough exam.  Visit your dentist for a routine exam and preventive care every 6 months. Brush your teeth twice a day and floss once a day. Good oral hygiene prevents tooth decay and gum disease.  The frequency of eye exams is based on your age, health, family medical history, use of contact lenses, and other factors. Follow your health care provider's recommendations for frequency of eye exams.  Eat a healthy diet. Foods like vegetables, fruits, whole grains, low-fat dairy products, and lean protein foods contain the nutrients you need without too many calories. Decrease your intake of foods high in solid fats, added sugars, and salt. Eat the right amount of calories for you.Get information about a proper diet from your health care provider, if necessary.  Regular physical exercise is one of the most important things you can do for your health. Most adults should get at least 150 minutes of moderate-intensity exercise (any activity that increases your heart rate and causes you to sweat) each week. In addition, most adults need muscle-strengthening exercises on 2 or more days a week.  Maintain a healthy weight. The body mass index (BMI) is a screening tool to identify possible weight problems. It provides an estimate of body fat based on height and weight. Your health care provider can find your BMI and can help you achieve or maintain a healthy weight.For adults 20 years and older:  A BMI below 18.5 is considered underweight.  A BMI of 18.5 to 24.9 is normal.  A BMI of 25 to 29.9 is considered overweight.  A BMI of  30 and above is considered obese.  Maintain normal blood lipids and cholesterol levels by exercising and minimizing your intake of saturated fat. Eat a balanced diet with plenty of fruit and vegetables. Blood tests for lipids and cholesterol should begin at age 76 and be repeated every 5 years. If your lipid or cholesterol levels are high, you are over 50, or you are at high risk for heart disease, you may need your cholesterol levels checked more frequently.Ongoing high lipid and cholesterol levels should be treated with medicines if diet and exercise are not working.  If you smoke, find out from your health care provider how to quit. If you do not use tobacco, do not start.  Lung cancer screening is recommended for adults aged 22-80 years who are at high risk for developing lung cancer because of a history of smoking. A yearly low-dose CT scan of the lungs is recommended for people who have at least a 30-pack-year history of smoking and are a current smoker or have quit within the past 15 years. A pack year of smoking is smoking an average of 1 pack of cigarettes a day for 1 year (for example: 1 pack a day for 30 years or 2 packs a day for 15 years). Yearly screening should continue until the smoker has stopped smoking for at least 15 years. Yearly screening should be stopped for people who develop a health problem that would prevent them from having lung cancer treatment.  If you are pregnant, do not drink alcohol. If you are breastfeeding,  be very cautious about drinking alcohol. If you are not pregnant and choose to drink alcohol, do not have more than 1 drink per day. One drink is considered to be 12 ounces (355 mL) of beer, 5 ounces (148 mL) of wine, or 1.5 ounces (44 mL) of liquor.  Avoid use of street drugs. Do not share needles with anyone. Ask for help if you need support or instructions about stopping the use of drugs.  High blood pressure causes heart disease and increases the risk of  stroke. Your blood pressure should be checked at least every 1 to 2 years. Ongoing high blood pressure should be treated with medicines if weight loss and exercise do not work.  If you are 3-86 years old, ask your health care provider if you should take aspirin to prevent strokes.  Diabetes screening involves taking a blood sample to check your fasting blood sugar level. This should be done once every 3 years, after age 67, if you are within normal weight and without risk factors for diabetes. Testing should be considered at a younger age or be carried out more frequently if you are overweight and have at least 1 risk factor for diabetes.  Breast cancer screening is essential preventive care for women. You should practice "breast self-awareness." This means understanding the normal appearance and feel of your breasts and may include breast self-examination. Any changes detected, no matter how small, should be reported to a health care provider. Women in their 8s and 30s should have a clinical breast exam (CBE) by a health care provider as part of a regular health exam every 1 to 3 years. After age 70, women should have a CBE every year. Starting at age 25, women should consider having a mammogram (breast X-ray test) every year. Women who have a family history of breast cancer should talk to their health care provider about genetic screening. Women at a high risk of breast cancer should talk to their health care providers about having an MRI and a mammogram every year.  Breast cancer gene (BRCA)-related cancer risk assessment is recommended for women who have family members with BRCA-related cancers. BRCA-related cancers include breast, ovarian, tubal, and peritoneal cancers. Having family members with these cancers may be associated with an increased risk for harmful changes (mutations) in the breast cancer genes BRCA1 and BRCA2. Results of the assessment will determine the need for genetic counseling and  BRCA1 and BRCA2 testing.  Routine pelvic exams to screen for cancer are no longer recommended for nonpregnant women who are considered low risk for cancer of the pelvic organs (ovaries, uterus, and vagina) and who do not have symptoms. Ask your health care provider if a screening pelvic exam is right for you.  If you have had past treatment for cervical cancer or a condition that could lead to cancer, you need Pap tests and screening for cancer for at least 20 years after your treatment. If Pap tests have been discontinued, your risk factors (such as having a new sexual partner) need to be reassessed to determine if screening should be resumed. Some women have medical problems that increase the chance of getting cervical cancer. In these cases, your health care provider may recommend more frequent screening and Pap tests.  The HPV test is an additional test that may be used for cervical cancer screening. The HPV test looks for the virus that can cause the cell changes on the cervix. The cells collected during the Pap test can be  tested for HPV. The HPV test could be used to screen women aged 30 years and older, and should be used in women of any age who have unclear Pap test results. After the age of 30, women should have HPV testing at the same frequency as a Pap test.  Colorectal cancer can be detected and often prevented. Most routine colorectal cancer screening begins at the age of 50 years and continues through age 75 years. However, your health care provider may recommend screening at an earlier age if you have risk factors for colon cancer. On a yearly basis, your health care provider may provide home test kits to check for hidden blood in the stool. Use of a small camera at the end of a tube, to directly examine the colon (sigmoidoscopy or colonoscopy), can detect the earliest forms of colorectal cancer. Talk to your health care provider about this at age 50, when routine screening begins. Direct  exam of the colon should be repeated every 5-10 years through age 75 years, unless early forms of pre-cancerous polyps or small growths are found.  People who are at an increased risk for hepatitis B should be screened for this virus. You are considered at high risk for hepatitis B if:  You were born in a country where hepatitis B occurs often. Talk with your health care provider about which countries are considered high risk.  Your parents were born in a high-risk country and you have not received a shot to protect against hepatitis B (hepatitis B vaccine).  You have HIV or AIDS.  You use needles to inject street drugs.  You live with, or have sex with, someone who has hepatitis B.  You get hemodialysis treatment.  You take certain medicines for conditions like cancer, organ transplantation, and autoimmune conditions.  Hepatitis C blood testing is recommended for all people born from 1945 through 1965 and any individual with known risks for hepatitis C.  Practice safe sex. Use condoms and avoid high-risk sexual practices to reduce the spread of sexually transmitted infections (STIs). STIs include gonorrhea, chlamydia, syphilis, trichomonas, herpes, HPV, and human immunodeficiency virus (HIV). Herpes, HIV, and HPV are viral illnesses that have no cure. They can result in disability, cancer, and death.  You should be screened for sexually transmitted illnesses (STIs) including gonorrhea and chlamydia if:  You are sexually active and are younger than 24 years.  You are older than 24 years and your health care provider tells you that you are at risk for this type of infection.  Your sexual activity has changed since you were last screened and you are at an increased risk for chlamydia or gonorrhea. Ask your health care provider if you are at risk.  If you are at risk of being infected with HIV, it is recommended that you take a prescription medicine daily to prevent HIV infection. This is  called preexposure prophylaxis (PrEP). You are considered at risk if:  You are a heterosexual woman, are sexually active, and are at increased risk for HIV infection.  You take drugs by injection.  You are sexually active with a partner who has HIV.  Talk with your health care provider about whether you are at high risk of being infected with HIV. If you choose to begin PrEP, you should first be tested for HIV. You should then be tested every 3 months for as long as you are taking PrEP.  Osteoporosis is a disease in which the bones lose minerals and strength   with aging. This can result in serious bone fractures or breaks. The risk of osteoporosis can be identified using a bone density scan. Women ages 65 years and over and women at risk for fractures or osteoporosis should discuss screening with their health care providers. Ask your health care provider whether you should take a calcium supplement or vitamin D to reduce the rate of osteoporosis.  Menopause can be associated with physical symptoms and risks. Hormone replacement therapy is available to decrease symptoms and risks. You should talk to your health care provider about whether hormone replacement therapy is right for you.  Use sunscreen. Apply sunscreen liberally and repeatedly throughout the day. You should seek shade when your shadow is shorter than you. Protect yourself by wearing long sleeves, pants, a wide-brimmed hat, and sunglasses year round, whenever you are outdoors.  Once a month, do a whole body skin exam, using a mirror to look at the skin on your back. Tell your health care provider of new moles, moles that have irregular borders, moles that are larger than a pencil eraser, or moles that have changed in shape or color.  Stay current with required vaccines (immunizations).  Influenza vaccine. All adults should be immunized every year.  Tetanus, diphtheria, and acellular pertussis (Td, Tdap) vaccine. Pregnant women should  receive 1 dose of Tdap vaccine during each pregnancy. The dose should be obtained regardless of the length of time since the last dose. Immunization is preferred during the 27th-36th week of gestation. An adult who has not previously received Tdap or who does not know her vaccine status should receive 1 dose of Tdap. This initial dose should be followed by tetanus and diphtheria toxoids (Td) booster doses every 10 years. Adults with an unknown or incomplete history of completing a 3-dose immunization series with Td-containing vaccines should begin or complete a primary immunization series including a Tdap dose. Adults should receive a Td booster every 10 years.  Varicella vaccine. An adult without evidence of immunity to varicella should receive 2 doses or a second dose if she has previously received 1 dose. Pregnant females who do not have evidence of immunity should receive the first dose after pregnancy. This first dose should be obtained before leaving the health care facility. The second dose should be obtained 4-8 weeks after the first dose.  Human papillomavirus (HPV) vaccine. Females aged 13-26 years who have not received the vaccine previously should obtain the 3-dose series. The vaccine is not recommended for use in pregnant females. However, pregnancy testing is not needed before receiving a dose. If a female is found to be pregnant after receiving a dose, no treatment is needed. In that case, the remaining doses should be delayed until after the pregnancy. Immunization is recommended for any person with an immunocompromised condition through the age of 26 years if she did not get any or all doses earlier. During the 3-dose series, the second dose should be obtained 4-8 weeks after the first dose. The third dose should be obtained 24 weeks after the first dose and 16 weeks after the second dose.  Zoster vaccine. One dose is recommended for adults aged 60 years or older unless certain conditions are  present.  Measles, mumps, and rubella (MMR) vaccine. Adults born before 1957 generally are considered immune to measles and mumps. Adults born in 1957 or later should have 1 or more doses of MMR vaccine unless there is a contraindication to the vaccine or there is laboratory evidence of immunity to   each of the three diseases. A routine second dose of MMR vaccine should be obtained at least 28 days after the first dose for students attending postsecondary schools, health care workers, or international travelers. People who received inactivated measles vaccine or an unknown type of measles vaccine during 1963-1967 should receive 2 doses of MMR vaccine. People who received inactivated mumps vaccine or an unknown type of mumps vaccine before 1979 and are at high risk for mumps infection should consider immunization with 2 doses of MMR vaccine. For females of childbearing age, rubella immunity should be determined. If there is no evidence of immunity, females who are not pregnant should be vaccinated. If there is no evidence of immunity, females who are pregnant should delay immunization until after pregnancy. Unvaccinated health care workers born before 1957 who lack laboratory evidence of measles, mumps, or rubella immunity or laboratory confirmation of disease should consider measles and mumps immunization with 2 doses of MMR vaccine or rubella immunization with 1 dose of MMR vaccine.  Pneumococcal 13-valent conjugate (PCV13) vaccine. When indicated, a person who is uncertain of her immunization history and has no record of immunization should receive the PCV13 vaccine. An adult aged 19 years or older who has certain medical conditions and has not been previously immunized should receive 1 dose of PCV13 vaccine. This PCV13 should be followed with a dose of pneumococcal polysaccharide (PPSV23) vaccine. The PPSV23 vaccine dose should be obtained at least 8 weeks after the dose of PCV13 vaccine. An adult aged 19  years or older who has certain medical conditions and previously received 1 or more doses of PPSV23 vaccine should receive 1 dose of PCV13. The PCV13 vaccine dose should be obtained 1 or more years after the last PPSV23 vaccine dose.  Pneumococcal polysaccharide (PPSV23) vaccine. When PCV13 is also indicated, PCV13 should be obtained first. All adults aged 65 years and older should be immunized. An adult younger than age 65 years who has certain medical conditions should be immunized. Any person who resides in a nursing home or long-term care facility should be immunized. An adult smoker should be immunized. People with an immunocompromised condition and certain other conditions should receive both PCV13 and PPSV23 vaccines. People with human immunodeficiency virus (HIV) infection should be immunized as soon as possible after diagnosis. Immunization during chemotherapy or radiation therapy should be avoided. Routine use of PPSV23 vaccine is not recommended for American Indians, [place] Natives, or people younger than 65 years unless there are medical conditions that require PPSV23 vaccine. When indicated, people who have unknown immunization and have no record of immunization should receive PPSV23 vaccine. One-time revaccination 5 years after the first dose of PPSV23 is recommended for people aged 19-64 years who have chronic kidney failure, nephrotic syndrome, asplenia, or immunocompromised conditions. People who received 1-2 doses of PPSV23 before age 65 years should receive another dose of PPSV23 vaccine at age 65 years or later if at least 5 years have passed since the previous dose. Doses of PPSV23 are not needed for people immunized with PPSV23 at or after age 65 years.  Meningococcal vaccine. Adults with asplenia or persistent complement component deficiencies should receive 2 doses of quadrivalent meningococcal conjugate (MenACWY-D) vaccine. The doses should be obtained at least 2 months apart.  Microbiologists working with certain meningococcal bacteria, military recruits, people at risk during an outbreak, and people who travel to or live in countries with a high rate of meningitis should be immunized. A first-year college student up through age   21 years who is living in a residence hall should receive a dose if she did not receive a dose on or after her 16th birthday. Adults who have certain high-risk conditions should receive one or more doses of vaccine.  Hepatitis A vaccine. Adults who wish to be protected from this disease, have certain high-risk conditions, work with hepatitis A-infected animals, work in hepatitis A research labs, or travel to or work in countries with a high rate of hepatitis A should be immunized. Adults who were previously unvaccinated and who anticipate close contact with an international adoptee during the first 60 days after arrival in the [place] from a country with a high rate of hepatitis A should be immunized.  Hepatitis B vaccine. Adults who wish to be protected from this disease, have certain high-risk conditions, may be exposed to blood or other infectious body fluids, are household contacts or sex partners of hepatitis B positive people, are clients or workers in certain care facilities, or travel to or work in countries with a high rate of hepatitis B should be immunized.  Haemophilus influenzae type b (Hib) vaccine. A previously unvaccinated person with asplenia or sickle cell disease or having a scheduled splenectomy should receive 1 dose of Hib vaccine. Regardless of previous immunization, a recipient of a hematopoietic stem cell transplant should receive a 3-dose series 6-12 months after her successful transplant. Hib vaccine is not recommended for adults with HIV infection. Preventive Services / Frequency Ages 64 to 68 years  Blood pressure check.** / Every 1 to 2 years.  Lipid and cholesterol check.** / Every 5 years beginning at age  22.  Clinical breast exam.** / Every 3 years for women in their 88s and 53s.  BRCA-related cancer risk assessment.** / For women who have family members with a BRCA-related cancer (breast, ovarian, tubal, or peritoneal cancers).  Pap test.** / Every 2 years from ages 90 through 51. Every 3 years starting at age 21 through age 56 or 3 with a history of 3 consecutive normal Pap tests.  HPV screening.** / Every 3 years from ages 24 through ages 1 to 46 with a history of 3 consecutive normal Pap tests.  Hepatitis C blood test.** / For any individual with known risks for hepatitis C.  Skin self-exam. / Monthly.  Influenza vaccine. / Every year.  Tetanus, diphtheria, and acellular pertussis (Tdap, Td) vaccine.** / Consult your health care provider. Pregnant women should receive 1 dose of Tdap vaccine during each pregnancy. 1 dose of Td every 10 years.  Varicella vaccine.** / Consult your health care provider. Pregnant females who do not have evidence of immunity should receive the first dose after pregnancy.  HPV vaccine. / 3 doses over 6 months, if 72 and younger. The vaccine is not recommended for use in pregnant females. However, pregnancy testing is not needed before receiving a dose.  Measles, mumps, rubella (MMR) vaccine.** / You need at least 1 dose of MMR if you were born in 1957 or later. You may also need a 2nd dose. For females of childbearing age, rubella immunity should be determined. If there is no evidence of immunity, females who are not pregnant should be vaccinated. If there is no evidence of immunity, females who are pregnant should delay immunization until after pregnancy.  Pneumococcal 13-valent conjugate (PCV13) vaccine.** / Consult your health care provider.  Pneumococcal polysaccharide (PPSV23) vaccine.** / 1 to 2 doses if you smoke cigarettes or if you have certain conditions.  Meningococcal vaccine.** /  1 dose if you are age 19 to 21 years and a first-year college  student living in a residence hall, or have one of several medical conditions, you need to get vaccinated against meningococcal disease. You may also need additional booster doses.  Hepatitis A vaccine.** / Consult your health care provider.  Hepatitis B vaccine.** / Consult your health care provider.  Haemophilus influenzae type b (Hib) vaccine.** / Consult your health care provider. Ages 40 to 64 years  Blood pressure check.** / Every 1 to 2 years.  Lipid and cholesterol check.** / Every 5 years beginning at age 20 years.  Lung cancer screening. / Every year if you are aged 55-80 years and have a 30-pack-year history of smoking and currently smoke or have quit within the past 15 years. Yearly screening is stopped once you have quit smoking for at least 15 years or develop a health problem that would prevent you from having lung cancer treatment.  Clinical breast exam.** / Every year after age 40 years.  BRCA-related cancer risk assessment.** / For women who have family members with a BRCA-related cancer (breast, ovarian, tubal, or peritoneal cancers).  Mammogram.** / Every year beginning at age 40 years and continuing for as long as you are in good health. Consult with your health care provider.  Pap test.** / Every 3 years starting at age 30 years through age 65 or 70 years with a history of 3 consecutive normal Pap tests.  HPV screening.** / Every 3 years from ages 30 years through ages 65 to 70 years with a history of 3 consecutive normal Pap tests.  Fecal occult blood test (FOBT) of stool. / Every year beginning at age 50 years and continuing until age 75 years. You may not need to do this test if you get a colonoscopy every 10 years.  Flexible sigmoidoscopy or colonoscopy.** / Every 5 years for a flexible sigmoidoscopy or every 10 years for a colonoscopy beginning at age 50 years and continuing until age 75 years.  Hepatitis C blood test.** / For all people born from 1945 through  1965 and any individual with known risks for hepatitis C.  Skin self-exam. / Monthly.  Influenza vaccine. / Every year.  Tetanus, diphtheria, and acellular pertussis (Tdap/Td) vaccine.** / Consult your health care provider. Pregnant women should receive 1 dose of Tdap vaccine during each pregnancy. 1 dose of Td every 10 years.  Varicella vaccine.** / Consult your health care provider. Pregnant females who do not have evidence of immunity should receive the first dose after pregnancy.  Zoster vaccine.** / 1 dose for adults aged 60 years or older.  Measles, mumps, rubella (MMR) vaccine.** / You need at least 1 dose of MMR if you were born in 1957 or later. You may also need a 2nd dose. For females of childbearing age, rubella immunity should be determined. If there is no evidence of immunity, females who are not pregnant should be vaccinated. If there is no evidence of immunity, females who are pregnant should delay immunization until after pregnancy.  Pneumococcal 13-valent conjugate (PCV13) vaccine.** / Consult your health care provider.  Pneumococcal polysaccharide (PPSV23) vaccine.** / 1 to 2 doses if you smoke cigarettes or if you have certain conditions.  Meningococcal vaccine.** / Consult your health care provider.  Hepatitis A vaccine.** / Consult your health care provider.  Hepatitis B vaccine.** / Consult your health care provider.  Haemophilus influenzae type b (Hib) vaccine.** / Consult your health care provider. Ages 65   years and over  Blood pressure check.** / Every 1 to 2 years.  Lipid and cholesterol check.** / Every 5 years beginning at age 22 years.  Lung cancer screening. / Every year if you are aged 73-80 years and have a 30-pack-year history of smoking and currently smoke or have quit within the past 15 years. Yearly screening is stopped once you have quit smoking for at least 15 years or develop a health problem that would prevent you from having lung cancer  treatment.  Clinical breast exam.** / Every year after age 4 years.  BRCA-related cancer risk assessment.** / For women who have family members with a BRCA-related cancer (breast, ovarian, tubal, or peritoneal cancers).  Mammogram.** / Every year beginning at age 40 years and continuing for as long as you are in good health. Consult with your health care provider.  Pap test.** / Every 3 years starting at age 9 years through age 34 or 91 years with 3 consecutive normal Pap tests. Testing can be stopped between 65 and 70 years with 3 consecutive normal Pap tests and no abnormal Pap or HPV tests in the past 10 years.  HPV screening.** / Every 3 years from ages 57 years through ages 64 or 45 years with a history of 3 consecutive normal Pap tests. Testing can be stopped between 65 and 70 years with 3 consecutive normal Pap tests and no abnormal Pap or HPV tests in the past 10 years.  Fecal occult blood test (FOBT) of stool. / Every year beginning at age 15 years and continuing until age 17 years. You may not need to do this test if you get a colonoscopy every 10 years.  Flexible sigmoidoscopy or colonoscopy.** / Every 5 years for a flexible sigmoidoscopy or every 10 years for a colonoscopy beginning at age 86 years and continuing until age 71 years.  Hepatitis C blood test.** / For all people born from [birth year] through 1965 and any individual with known risks for hepatitis C.  Osteoporosis screening.** / A one-time screening for women ages 83 years and over and women at risk for fractures or osteoporosis.  Skin self-exam. / Monthly.  Influenza vaccine. / Every year.  Tetanus, diphtheria, and acellular pertussis (Tdap/Td) vaccine.** / 1 dose of Td every 10 years.  Varicella vaccine.** / Consult your health care provider.  Zoster vaccine.** / 1 dose for adults aged 61 years or older.  Pneumococcal 13-valent conjugate (PCV13) vaccine.** / Consult your health care provider.  Pneumococcal  polysaccharide (PPSV23) vaccine.** / 1 dose for all adults aged 28 years and older.  Meningococcal vaccine.** / Consult your health care provider.  Hepatitis A vaccine.** / Consult your health care provider.  Hepatitis B vaccine.** / Consult your health care provider.  Haemophilus influenzae type b (Hib) vaccine.** / Consult your health care provider. ** Family history and personal history of risk and conditions may change your health care provider's recommendations. Document Released: 09/23/2001 Document Revised: 12/12/2013 Document Reviewed: 12/23/2010 [facility] Patient Information 2015 [place], [place]. This information is not intended to replace advice given to you by your health care provider. Make sure you discuss any questions you have with your health care provider.

## 2015-01-23 NOTE — Progress Notes (Signed)
  Subjective:     [name] [name] is a 44 y.o. female and is here for a comprehensive physical exam. The patient reports no problems. Has h/o endometriosis, currently controlled on NuvaRing continuously. Would like STD testing.  History   Social History  . Marital Status: Single    Spouse Name: N/A  . Number of Children: N/A  . Years of Education: N/A   Occupational History  . Not on file.   Social History Main Topics  . Smoking status: Never Smoker   . Smokeless tobacco: Never Used  . Alcohol Use: No  . Drug Use: No  . Sexual Activity:    Partners: Male    Birth Control/ Protection: Inserts     Comment: Nuva ring   Other Topics Concern  . Not on file   Social History Narrative   Health Maintenance  Topic Date Due  . Janet Berlin  01/02/1990  . INFLUENZA VACCINE  03/12/2015  . PAP SMEAR  01/18/2017  . HIV Screening  Completed    The following portions of the patient's history were reviewed and updated as appropriate: allergies, current medications, past family history, past medical history, past social history, past surgical history and problem list.  Review of Systems A comprehensive review of systems was negative.   Objective:    BP 130/89 mmHg  Pulse 73  Ht 5\' 3"  (1.6 m)  Wt 135 lb (61.236 kg)  BMI 23.92 kg/m2 General appearance: alert, cooperative and appears stated age Head: Normocephalic, without obvious abnormality, atraumatic Neck: no adenopathy, supple, symmetrical, trachea midline and thyroid not enlarged, symmetric, no tenderness/mass/nodules Lungs: clear to auscultation bilaterally Breasts: normal appearance, no masses or tenderness Heart: regular rate and rhythm, S1, S2 normal, no murmur, click, rub or gallop Abdomen: soft, non-tender; bowel sounds normal; no masses,  no organomegaly Pelvic: cervix normal in appearance, external genitalia normal, no adnexal masses or tenderness, no cervical motion tenderness, uterus normal size, shape, and  consistency, vagina normal without discharge and NuvaRing in place Extremities: extremities normal, atraumatic, no cyanosis or edema Pulses: 2+ and symmetric Skin: Skin color, texture, turgor normal. No rashes or lesions Lymph nodes: Cervical, supraclavicular, and axillary nodes normal. Neurologic: Grossly normal    Assessment:    Healthy female exam.      Plan:      Problem List Items Addressed This Visit      Unprioritized   Endometriosis of pelvis   Relevant Medications   etonogestrel-ethinyl estradiol (NUVARING) 0.12-0.015 MG/24HR vaginal ring    Other Visit Diagnoses    Encounter for routine gynecological examination    -  Primary    Relevant Orders    Cytology - PAP    CBC    Comprehensive metabolic panel    TSH    HIV antibody    RPR    Lipid panel    Screening for malignant neoplasm of cervix        Relevant Orders    Cytology - PAP     Has opted for q o year mammogram.  See After Visit Summary for Counseling Recommendations

## 2015-01-24 LAB — HIV ANTIBODY (ROUTINE TESTING W REFLEX): HIV 1&2 Ab, 4th Generation: NONREACTIVE

## 2015-01-24 LAB — RPR

## 2015-01-25 LAB — CYTOLOGY - PAP

## 2015-02-13 ENCOUNTER — Encounter: Payer: Self-pay | Admitting: Family Medicine

## 2015-03-01 ENCOUNTER — Ambulatory Visit (INDEPENDENT_AMBULATORY_CARE_PROVIDER_SITE_OTHER): Payer: PRIVATE HEALTH INSURANCE

## 2015-03-01 ENCOUNTER — Encounter: Payer: Self-pay | Admitting: Podiatry

## 2015-03-01 ENCOUNTER — Ambulatory Visit (INDEPENDENT_AMBULATORY_CARE_PROVIDER_SITE_OTHER): Payer: PRIVATE HEALTH INSURANCE | Admitting: Podiatry

## 2015-03-01 VITALS — BP 122/69 | HR 69 | Resp 16

## 2015-03-01 DIAGNOSIS — M7661 Achilles tendinitis, right leg: Secondary | ICD-10-CM | POA: Diagnosis not present

## 2015-03-01 DIAGNOSIS — M722 Plantar fascial fibromatosis: Secondary | ICD-10-CM | POA: Diagnosis not present

## 2015-03-01 NOTE — Progress Notes (Signed)
   Subjective:    Patient ID: [name] [name], female    DOB: [birth year], 44 y.o.   MRN: [id number]  HPI Comments: "I have pain in the bottom and back side of this right foot"  Patient c/o aching, sharp sensations arch and posterior heel right for about 1 week. No injury. Posterior heel towards lateral side is swollen. The episodes are sudden and almost falls down its so intense. She has been taking Ibuprofen 800 mg-some help.     Review of Systems  Musculoskeletal: Positive for gait problem.  All other systems reviewed and are negative.      Objective:   Physical Exam  I have reviewed her past history medications allergies surgery social history and review systems. Pulses are strongly palpable bilateral. Neurologic sensorium is intact for Semmes-Weinstein monofilament. Deep tendon reflexes are intact bilaterally muscle strength +5 over 5 dorsiflexors plantar flexors and inverters everters all Jones musculature is intact. Orthopedic evaluation demonstrates all joints distal to the ankle for range of motion without crepitation. Cutaneous evaluation demonstrates supple well-hydrated cutis. She has pain on palpation medial calcaneal tubercle of the right heel. Radiographs confirm soft tissue increase in density at the plantar fascial calcaneal insertion site.         Assessment & Plan:  Assessment: Plantar fasciitis right  Plan: Discussed etiology pathology conservative versus surgical therapies. Injected the heel today with Kenalog and local anesthetic. Dispensed a night splint with instructions for wear. Discussed the appropriate shoe gear stretching exercises ice therapy and shoe modifications. She is to continue with the Ibuprofen 800 mg that she has already been taking. I will follow-up with her 1 month.

## 2015-03-14 ENCOUNTER — Ambulatory Visit: Payer: Self-pay | Admitting: Podiatry

## 2015-03-23 ENCOUNTER — Ambulatory Visit: Payer: PRIVATE HEALTH INSURANCE | Admitting: Obstetrics & Gynecology

## 2015-03-23 DIAGNOSIS — N898 Other specified noninflammatory disorders of vagina: Secondary | ICD-10-CM

## 2015-05-25 ENCOUNTER — Telehealth: Payer: Self-pay | Admitting: *Deleted

## 2015-05-25 MED ORDER — FLUCONAZOLE 150 MG PO TABS: 150.0000 mg | ORAL_TABLET | Freq: Once | ORAL | Status: DC

## 2015-05-25 NOTE — Telephone Encounter (Signed)
-----   Message from Olevia BowensJacinda S Battle sent at 05/25/2015  8:50 AM EDT ----- Regarding: Wants RX Contact: [phone] Ms. Mapps wants to know if she could have a diflucan called into her pharmacy, or would you rather her make an appt.? Has symptoms of a yeast infection.

## 2015-05-25 NOTE — Telephone Encounter (Signed)
Spoke to pt, having white vaginal discharge with vaginal itching and burning.  Sent rx for Diflucan to pharmacy.  Instructed on directions of use and if symptoms persist after treatment pt will need to make an appt.

## 2015-05-30 ENCOUNTER — Encounter: Payer: Self-pay | Admitting: Advanced Practice Midwife

## 2015-05-30 ENCOUNTER — Ambulatory Visit (INDEPENDENT_AMBULATORY_CARE_PROVIDER_SITE_OTHER): Payer: PRIVATE HEALTH INSURANCE | Admitting: Advanced Practice Midwife

## 2015-05-30 VITALS — BP 127/76 | HR 69 | Wt 135.0 lb

## 2015-05-30 DIAGNOSIS — Z113 Encounter for screening for infections with a predominantly sexual mode of transmission: Secondary | ICD-10-CM | POA: Diagnosis not present

## 2015-05-30 DIAGNOSIS — N898 Other specified noninflammatory disorders of vagina: Secondary | ICD-10-CM

## 2015-05-30 DIAGNOSIS — R300 Dysuria: Secondary | ICD-10-CM

## 2015-05-30 LAB — POCT URINALYSIS DIPSTICK
BILIRUBIN UA: NEGATIVE
Blood, UA: NEGATIVE
GLUCOSE UA: NEGATIVE
Ketones, UA: NEGATIVE
LEUKOCYTES UA: NEGATIVE
NITRITE UA: NEGATIVE
Protein, UA: NEGATIVE
Spec Grav, UA: <=1.005
Urobilinogen, UA: NEGATIVE
pH, UA: 5

## 2015-05-30 NOTE — Patient Instructions (Signed)

## 2015-05-30 NOTE — Progress Notes (Signed)
   Subjective:    Patient ID: [name] [name], female    DOB: 06-03-1971, 44 y.o.   MRN: [id number]  HPI: Here w/ report of vaginal irritation, vaginal discharge and burning w./ urination x 1 week.  Called office 05/25/15 RE: Sx. Rx Diflucan. Took one dose 10/14 and second dose 10/17. Partial relief of Sx. Here for further eval.    Review of Systems  Neg for fever, chills, abd pain, VB. Pos for vaginal irritation w/ IC.      Objective:   Physical Exam BP 127/76 mmHg  Pulse 69  Wt 135 lb (61.236 kg) Constitutional: NAD Heart: Regular rate Lungs: normal effort Abd: Soft, NT Pelvic: NEFG, small amount of thick, white, odorless discharge. Cervix non-friable. No adnexal tenderness, masses or CMT     Assessment & Plan:  1. Burning with urination  - POCT Urinalysis Dipstick - WET PREP BY MOLECULAR PROBE  2. Vaginal irritation  - WET PREP BY MOLECULAR PROBE - GC/Chlamydia probe amp ([facility]  Claryssa Sandner, [place] 05/30/2015 4:15 PM

## 2015-05-31 LAB — WET PREP BY MOLECULAR PROBE
CANDIDA SPECIES: NEGATIVE
Gardnerella vaginalis: NEGATIVE
TRICHOMONAS VAG: NEGATIVE

## 2015-06-01 ENCOUNTER — Encounter: Payer: Self-pay | Admitting: Obstetrics & Gynecology

## 2015-06-01 LAB — GC/CHLAMYDIA PROBE AMP (~~LOC~~) NOT AT ARMC
CHLAMYDIA, DNA PROBE: NEGATIVE
Neisseria Gonorrhea: NEGATIVE

## 2015-06-04 ENCOUNTER — Telehealth: Payer: Self-pay | Admitting: *Deleted

## 2015-06-04 ENCOUNTER — Ambulatory Visit: Payer: Self-pay

## 2015-06-04 ENCOUNTER — Encounter: Payer: Self-pay | Admitting: Physician Assistant

## 2015-06-04 VITALS — BP 116/84 | Temp 98.6°F

## 2015-06-04 DIAGNOSIS — J3089 Other allergic rhinitis: Secondary | ICD-10-CM

## 2015-06-04 MED ORDER — METHYLPREDNISOLONE 4 MG PO TBPK: ORAL_TABLET | ORAL | Status: DC

## 2015-06-04 NOTE — Telephone Encounter (Signed)
-----   Message from Olevia BowensJacinda S Battle sent at 06/04/2015 11:25 AM EDT ----- Regarding: Test Result Contact: [phone] Patient was here on 05/30/2015, called and wanted to know test results from that visit

## 2015-06-04 NOTE — Patient Instructions (Signed)
Allergies °An allergy is when your body reacts to a substance in a way that is not normal. An allergic reaction can happen after you: °· Eat something. °· Breathe in something. °· Touch something. °WHAT KINDS OF ALLERGIES ARE THERE? °You can be allergic to: °· Things that are only around during certain seasons, like molds and pollens. °· Foods. °· Drugs. °· Insects. °· Animal dander. °WHAT ARE SYMPTOMS OF ALLERGIES? °· Puffiness (swelling). This may happen on the lips, face, tongue, mouth, or throat. °· Sneezing. °· Coughing. °· Breathing loudly (wheezing). °· Stuffy nose. °· Tingling in the mouth. °· A rash. °· Itching. °· Itchy, red, puffy areas of skin (hives). °· Watery eyes. °· Throwing up (vomiting). °· Watery poop (diarrhea). °· Dizziness. °· Feeling faint or fainting. °· Trouble breathing or swallowing. °· A tight feeling in the chest. °· A fast heartbeat. °HOW ARE ALLERGIES DIAGNOSED? °Allergies can be diagnosed with: °· A medical and family history. °· Skin tests. °· Blood tests. °· A food diary. A food diary is a record of all the foods, drinks, and symptoms you have each day. °· The results of an elimination diet. This diet involves making sure not to eat certain foods and then seeing what happens when you start eating them again. °HOW ARE ALLERGIES TREATED? °There is no cure for allergies, but allergic reactions can be treated with medicine. Severe reactions usually need to be treated at a hospital.  °HOW CAN REACTIONS BE PREVENTED? °The best way to prevent an allergic reaction is to avoid the thing you are allergic to. Allergy shots and medicines can also help prevent reactions in some cases. °  °This information is not intended to replace advice given to you by your health care provider. Make sure you discuss any questions you have with your health care provider. °  °Document Released: 11/22/2012 Document Revised: 08/18/2014 Document Reviewed: 05/09/2014 °Elsevier Interactive Patient Education ©2016  Elsevier Inc. ° °

## 2015-06-04 NOTE — Telephone Encounter (Signed)
Called pt, informed her of normal labs. No further concerns at this time.

## 2015-06-04 NOTE — Progress Notes (Signed)
S: C/o runny nose and congestion for 2 days, no fever, chills, cp/sob, v/d; mucus is clear,  c/o of facial pain. Had flu shot over 10d ago  Using otc meds:   O: PE: vitals wnl, nad perrl eomi, normocephalic, tms dull, nasal mucosa a little red and swollen mostly boggy, throat injected, neck supple no lymph, lungs c t a, cv rrr, neuro intact  A:  Acute allergic sinusitis   P: medrol dose pack, drink fluids, continue regular meds , use otc meds of choice, return if not improving in 5 days, return earlier if worsening

## 2015-06-26 ENCOUNTER — Other Ambulatory Visit: Payer: Self-pay | Admitting: Physician Assistant

## 2015-06-27 ENCOUNTER — Other Ambulatory Visit: Payer: Self-pay | Admitting: Family Medicine

## 2015-06-27 DIAGNOSIS — Z1231 Encounter for screening mammogram for malignant neoplasm of breast: Secondary | ICD-10-CM

## 2015-06-28 ENCOUNTER — Ambulatory Visit
Admission: RE | Admit: 2015-06-28 | Discharge: 2015-06-28 | Disposition: A | Discharge status: Home / Self Care | Payer: PRIVATE HEALTH INSURANCE | Source: Ambulatory Visit | Attending: Family Medicine | Admitting: Family Medicine

## 2015-06-28 DIAGNOSIS — Z1231 Encounter for screening mammogram for malignant neoplasm of breast: Secondary | ICD-10-CM | POA: Diagnosis not present

## 2015-06-29 ENCOUNTER — Ambulatory Visit: Payer: Self-pay | Admitting: Physician Assistant

## 2015-06-29 ENCOUNTER — Encounter: Payer: Self-pay | Admitting: Physician Assistant

## 2015-06-29 VITALS — BP 130/80 | HR 69 | Temp 98.8°F

## 2015-06-29 DIAGNOSIS — J309 Allergic rhinitis, unspecified: Secondary | ICD-10-CM

## 2015-06-29 MED ORDER — PREDNISONE 20 MG PO TABS: 40.0000 mg | ORAL_TABLET | Freq: Every day | ORAL | Status: DC

## 2015-06-29 MED ORDER — FLUTICASONE PROPIONATE 50 MCG/ACT NA SUSP: 2.0000 | Freq: Every day | NASAL | Status: DC

## 2015-06-29 NOTE — Progress Notes (Signed)
S/ nasal congestion , increased clear drainage and headaches  No fever , ST  O/ VSS ENT nasal turbinates 4+ enlarged clear rhinorhea allergic appearance, sinus tender Otherwise unremarkable, neck supple , heart rsr lungs clear   A allergic rhinitis  allergy to mold and count is moderate  P allergy tips reviewed and encouraged rx flonase , prednisone po x 3 days. See orders.f/u prn

## 2015-07-16 ENCOUNTER — Other Ambulatory Visit: Payer: Self-pay | Admitting: Emergency Medicine

## 2015-07-16 DIAGNOSIS — K219 Gastro-esophageal reflux disease without esophagitis: Secondary | ICD-10-CM

## 2015-07-16 MED ORDER — OMEPRAZOLE 20 MG PO CPDR: 20.0000 mg | DELAYED_RELEASE_CAPSULE | Freq: Every day | ORAL | Status: DC

## 2015-07-16 NOTE — Telephone Encounter (Signed)
Received a faxed medication request from [facility] on [address]  Please advise.  Thank you. 

## 2015-08-30 ENCOUNTER — Ambulatory Visit: Payer: Self-pay | Admitting: Physician Assistant

## 2015-08-30 ENCOUNTER — Encounter: Payer: Self-pay | Admitting: Physician Assistant

## 2015-08-30 VITALS — BP 120/79 | HR 83 | Temp 98.9°F

## 2015-08-30 DIAGNOSIS — J3089 Other allergic rhinitis: Secondary | ICD-10-CM

## 2015-08-30 DIAGNOSIS — M25512 Pain in left shoulder: Secondary | ICD-10-CM

## 2015-08-30 MED ORDER — METHYLPREDNISOLONE 4 MG PO TBPK: ORAL_TABLET | ORAL | Status: AC

## 2015-08-30 MED ORDER — BACLOFEN 10 MG PO TABS
10.0000 mg | ORAL_TABLET | Freq: Three times a day (TID) | ORAL | Status: DC
Start: 2015-08-30 — End: 2015-11-09

## 2015-08-30 NOTE — Progress Notes (Signed)
S: c/o head congestion and pressure, no mucus, no drainage, no fever/chills/bodyaches, taking zyrtec and flonase without relief, also c/o left shoulder pain, no known injury, awoke with pain, has decreased movement, can't put on bra and can't lift arm overhead, denies numbness or tingling  O: vitals wnl, nad, tms clear, nasal mucosa swollen and pale pink, throat wnl, neck supple no lymph, lungs c t a, cv rrr, cspine nontender, left shoulder tender along acj, small depression noted adjacent to acj, decreased rom with internal rotation and overhead reach, n/v intact  A: acute left shoulder pain, ? Tear in muscle, seasonal allergies  P: medrol dose pack, baclofen, f/u with ortho for eval, put patient in sling, pt to use ice

## 2015-09-04 ENCOUNTER — Other Ambulatory Visit: Payer: Self-pay | Admitting: Emergency Medicine

## 2015-09-04 MED ORDER — IBUPROFEN 800 MG PO TABS: 800.0000 mg | ORAL_TABLET | Freq: Three times a day (TID) | ORAL | Status: DC | PRN

## 2015-09-04 NOTE — Telephone Encounter (Signed)
Received a faxed medication request from [facility] on [address].  Please advise.  Thank you.

## 2015-09-04 NOTE — Telephone Encounter (Signed)
Med refill approved, pt has been in clinic recently

## 2015-09-17 NOTE — Progress Notes (Signed)
Patient ID: [name] [name], female   DOB: 11-Mar-1971, 45 y.o.   MRN: [id number] Patient was referred to [facility].  Her appointment is scheduled for 09/26/15 @ 9am.  Arrival time is 8:45am.  Patient has been notified.

## 2015-11-09 ENCOUNTER — Ambulatory Visit: Payer: Self-pay | Admitting: Physician Assistant

## 2015-11-09 ENCOUNTER — Encounter: Payer: Self-pay | Admitting: Physician Assistant

## 2015-11-09 VITALS — BP 111/70 | HR 83 | Temp 98.8°F

## 2015-11-09 DIAGNOSIS — J069 Acute upper respiratory infection, unspecified: Secondary | ICD-10-CM

## 2015-11-09 MED ORDER — METHYLPREDNISOLONE 4 MG PO TBPK: ORAL_TABLET | ORAL | Status: DC

## 2015-11-09 NOTE — Progress Notes (Signed)
S: C/o cough and  congestion for 3 days, no fever, chills, cp/sob, v/d; voice is hoarse, isn't getting any mucus out, started using flonase and sudafed 2 days ago, also taking zyrtec    O: PE: vitals wnl, nad, perrl eomi, normocephalic, tms dull, nasal mucosa pale pink and swollen, throat injected, neck supple no lymph, lungs c t a, cv rrr, neuro intact  A:  Acute viral uri   P: medrol dose pack, drink fluids, continue regular meds , use otc meds of choice, return if not improving in 5 days, return earlier if worsening

## 2015-11-12 ENCOUNTER — Telehealth: Payer: Self-pay | Admitting: Emergency Medicine

## 2015-11-12 MED ORDER — AMOXICILLIN 875 MG PO TABS: 875.0000 mg | ORAL_TABLET | Freq: Two times a day (BID) | ORAL | Status: DC

## 2015-11-12 NOTE — Telephone Encounter (Signed)
Pt has chronic problems with allergies and sinuses, tried medrol dose pack but still feels like she's infected, sent rx for amoxil to pharmacy

## 2015-11-12 NOTE — Telephone Encounter (Signed)
Patient called and expressed that she is having a lot of head congestion and hoarseness. She is currently out of town and [facility] script [facility] [place]  It's located on [address]  The number is [phone].

## 2015-11-19 MED ORDER — FLUCONAZOLE 150 MG PO TABS: 150.0000 mg | ORAL_TABLET | Freq: Once | ORAL | Status: DC

## 2015-11-19 NOTE — Progress Notes (Signed)
Pt called and got a yeast infection from antibiotic, sent rx for diflucan to [facility]

## 2015-11-19 NOTE — Addendum Note (Signed)
Addended by: Faythe GheeFISHER, Buster Schueller W on: 11/19/2015 11:33 AM   Modules accepted: Orders

## 2015-12-06 ENCOUNTER — Other Ambulatory Visit: Payer: Self-pay | Admitting: Family

## 2015-12-06 ENCOUNTER — Other Ambulatory Visit: Payer: Self-pay | Admitting: Physician Assistant

## 2015-12-07 NOTE — Telephone Encounter (Signed)
Med refill approved 

## 2016-01-04 ENCOUNTER — Encounter: Payer: Self-pay | Admitting: Physician Assistant

## 2016-01-04 ENCOUNTER — Ambulatory Visit: Payer: Self-pay | Admitting: Physician Assistant

## 2016-01-04 VITALS — BP 122/79 | HR 76 | Temp 98.4°F

## 2016-01-04 DIAGNOSIS — J0100 Acute maxillary sinusitis, unspecified: Secondary | ICD-10-CM

## 2016-01-04 MED ORDER — FEXOFENADINE-PSEUDOEPHED ER 60-120 MG PO TB12: 1.0000 | ORAL_TABLET | Freq: Two times a day (BID) | ORAL | Status: DC

## 2016-01-04 MED ORDER — AMOXICILLIN 875 MG PO TABS: 875.0000 mg | ORAL_TABLET | Freq: Two times a day (BID) | ORAL | Status: DC

## 2016-01-04 NOTE — Progress Notes (Signed)
   Subjective:Sinus congestion    Patient ID: [name] [name], female    DOB: 02-18-1971, 45 y.o.   MRN: [id number]  HPI  Patient c/o one week of increasing sinus congestion, bilateral ear pressure, and thick yellowish nasal discharge. Denies fever/chill, or N/V/D. History of seasonal rhinitis.   Review of Systems     Objective:   Physical Exam Bilateral maxillary guarding.  Bilateral edematous nasal turbinates. Post nasal drainage. Left bulging TM. Post nasal drainage.  Neck supple without adenopathy. Lungs CTA, and Heart RRR.       Assessment & Plan:maxillary sinusitis  Amoxil and Allergra-D.  Follow up 3-5 days if no improvement.

## 2016-01-08 ENCOUNTER — Ambulatory Visit: Payer: Self-pay | Admitting: Physician Assistant

## 2016-01-08 ENCOUNTER — Encounter: Payer: Self-pay | Admitting: Physician Assistant

## 2016-01-08 VITALS — BP 100/70 | HR 89 | Temp 98.5°F

## 2016-01-08 DIAGNOSIS — W57XXXA Bitten or stung by nonvenomous insect and other nonvenomous arthropods, initial encounter: Secondary | ICD-10-CM

## 2016-01-08 MED ORDER — DEXAMETHASONE SODIUM PHOSPHATE 10 MG/ML IJ SOLN
10.0000 mg | Freq: Once | INTRAMUSCULAR | Status: AC
  Administered 2016-01-08: 10 mg via INTRAMUSCULAR

## 2016-01-08 NOTE — Progress Notes (Signed)
S: c/o bug bites with large whelps around them, only on one side, denies cp/sob/dif swallowing, noticed bites last night  O:vitals wnl, nad, skin with large hives on r shoulder and r leg, none on left, ent wnl, neck supple no lymph lungs c t a, cv rrr  A: insect bite  P: decadron 10mg  im, otc benadryl

## 2016-01-21 ENCOUNTER — Telehealth: Payer: Self-pay | Admitting: Emergency Medicine

## 2016-01-21 MED ORDER — METHYLPREDNISOLONE 4 MG PO TBPK
ORAL_TABLET | ORAL | Status: DC
Start: 2016-01-21 — End: 2016-02-15

## 2016-01-21 NOTE — Telephone Encounter (Signed)
Area on leg responded to decadron injection, states now its getting red again, ?if needs steroid pack Sent steroid pack rx to [facility]

## 2016-01-21 NOTE — Telephone Encounter (Signed)
Patient called and expressed that the place on her leg has started to get red and inflamed again.  [facility] [address]

## 2016-02-02 ENCOUNTER — Other Ambulatory Visit: Payer: Self-pay | Admitting: Family Medicine

## 2016-02-15 ENCOUNTER — Encounter: Payer: Self-pay | Admitting: Obstetrics & Gynecology

## 2016-02-15 ENCOUNTER — Ambulatory Visit (INDEPENDENT_AMBULATORY_CARE_PROVIDER_SITE_OTHER): Payer: Managed Care, Other (non HMO) | Admitting: Obstetrics & Gynecology

## 2016-02-15 VITALS — BP 121/77 | HR 82 | Resp 16 | Ht 63.0 in | Wt 136.0 lb

## 2016-02-15 DIAGNOSIS — Z124 Encounter for screening for malignant neoplasm of cervix: Secondary | ICD-10-CM

## 2016-02-15 DIAGNOSIS — Z3044 Encounter for surveillance of vaginal ring hormonal contraceptive device: Secondary | ICD-10-CM

## 2016-02-15 DIAGNOSIS — Z0000 Encounter for general adult medical examination without abnormal findings: Secondary | ICD-10-CM

## 2016-02-15 DIAGNOSIS — Z01419 Encounter for gynecological examination (general) (routine) without abnormal findings: Secondary | ICD-10-CM | POA: Diagnosis not present

## 2016-02-15 DIAGNOSIS — Z1151 Encounter for screening for human papillomavirus (HPV): Secondary | ICD-10-CM | POA: Diagnosis not present

## 2016-02-15 DIAGNOSIS — N803 Endometriosis of pelvic peritoneum: Secondary | ICD-10-CM | POA: Diagnosis not present

## 2016-02-15 DIAGNOSIS — IMO0002 Reserved for concepts with insufficient information to code with codable children: Secondary | ICD-10-CM

## 2016-02-15 DIAGNOSIS — Z113 Encounter for screening for infections with a predominantly sexual mode of transmission: Secondary | ICD-10-CM

## 2016-02-15 LAB — CBC
HCT: 42.1 % (ref 35.0–45.0)
Hemoglobin: 13.7 g/dL (ref 11.7–15.5)
MCH: 28.8 pg (ref 27.0–33.0)
MCHC: 32.5 g/dL (ref 32.0–36.0)
MCV: 88.4 fL (ref 80.0–100.0)
MPV: 10.7 fL (ref 7.5–12.5)
PLATELETS: 307 10*3/uL (ref 140–400)
RBC: 4.76 MIL/uL (ref 3.80–5.10)
RDW: 14.2 % (ref 11.0–15.0)
WBC: 6.5 10*3/uL (ref 3.8–10.8)

## 2016-02-15 LAB — TSH: TSH: 1.05 mIU/L

## 2016-02-15 MED ORDER — ETONOGESTREL-ETHINYL ESTRADIOL 0.12-0.015 MG/24HR VA RING: VAGINAL_RING | VAGINAL | Status: DC

## 2016-02-15 NOTE — Progress Notes (Signed)
GYNECOLOGY CLINIC ANNUAL PREVENTATIVE CARE ENCOUNTER NOTE  Subjective:   [name] [name] is a 45 y.o. G1P1 female here for a routine annual gynecologic exam.  Current complaints: none. Desires healthcare maintenance labs today   Denies abnormal vaginal bleeding, discharge, pelvic pain, problems with intercourse or other gynecologic concerns.  Desires Nuvaring refill, uses this continuously for endometriosis.   Gynecologic History No LMP recorded. Patient is not currently having periods (Reason: Other). Contraception: none Last Pap: 01/23/15. Results were normal pap smear and negative high-risk HPV Last mammogram: 06/28/2015. Results were: normal  Obstetric History OB History  Gravida Para Term Preterm AB SAB TAB Ectopic Multiple Living  1 1        1     # Outcome Date GA Lbr Len/2nd Weight Sex Delivery Anes PTL Lv  1 Para 1995    M CS-LTranv   Y      Past Medical History  Diagnosis Date  . Asthma   . Endometriosis     Uses Nuvaring continuously  . Constipation   . Ulcer   . Arthritis   . Heart murmur   . Allergy     Past Surgical History  Procedure Laterality Date  . Ankle surgery    . Cesarean section    . Laparoscopy      for endometriosis  . Appendectomy    . Cholecystectomy    . Laparoscopic endometriosis fulguration    . Wisdom tooth extraction      x2    Current Outpatient Prescriptions on File Prior to Visit  Medication Sig Dispense Refill  . Albuterol (VENTOLIN IN) Inhale into the lungs.    . cetirizine (ZYRTEC) 10 MG tablet Take 10 mg by mouth.    . fexofenadine-pseudoephedrine (ALLEGRA-D) 60-120 MG 12 hr tablet Take 1 tablet by mouth 2 (two) times daily. 30 tablet 0  . FLOVENT HFA 110 MCG/ACT inhaler INHALE ONE PUFF BY MOUTH TWICE DAILY 1 Inhaler 12  . fluticasone (FLONASE) 50 MCG/ACT nasal spray Place 2 sprays into both nostrils daily. 16 g 11  . ibuprofen (ADVIL,MOTRIN) 800 MG tablet Take 1 tablet (800 mg total) by mouth every 8 (eight) hours as  needed. 30 tablet 6  . NUVARING 0.12-0.015 MG/24HR vaginal ring INSERT VAGINALLY EVERY 4 WEEKS WITH NO INTERVAL. USE CONTINUOUSLY. 1 each 0  . omeprazole (PRILOSEC) 20 MG capsule Take 1 capsule (20 mg total) by mouth daily. 90 capsule 3   No current facility-administered medications on file prior to visit.    No Known Allergies  Social History   Social History  . Marital Status: Single    Spouse Name: N/A  . Number of Children: N/A  . Years of Education: N/A   Occupational History  . Not on file.   Social History Main Topics  . Smoking status: Never Smoker   . Smokeless tobacco: Never Used  . Alcohol Use: No  . Drug Use: No  . Sexual Activity: Not Currently    Birth Control/ Protection: Inserts     Comment: Nuva ring   Other Topics Concern  . Not on file   Social History Narrative    Family History  Problem Relation Age of Onset  . COPD Mother   . Emphysema Mother   . Cancer Father     unknown  . Cancer Paternal Aunt     ovarian    The following portions of the patient's history were reviewed and updated as appropriate: allergies, current medications, past family  history, past medical history, past social history, past surgical history and problem list.  Review of Systems Pertinent items noted in HPI and remainder of comprehensive ROS otherwise negative.   Objective:  BP 121/77 mmHg  Pulse 82  Resp 16  Ht 5\' 3"  (1.6 m)  Wt 136 lb (61.689 kg)  BMI 24.10 kg/m2 CONSTITUTIONAL: Well-developed, well-nourished female in no acute distress.  HENT:  Normocephalic, atraumatic, External right and left ear normal. Oropharynx is clear and moist EYES: Conjunctivae and EOM are normal. Pupils are equal, round, and reactive to light. No scleral icterus.  NECK: Normal range of motion, supple, no masses.  Normal thyroid.  SKIN: Skin is warm and dry. No rash noted. Not diaphoretic. No erythema. No pallor. NEUROLOGIC: Alert and oriented to person, place, and time. Normal  reflexes, muscle tone coordination. No cranial nerve deficit noted. PSYCHIATRIC: Normal mood and affect. Normal behavior. Normal judgment and thought content. CARDIOVASCULAR: Normal heart rate noted, regular rhythm RESPIRATORY: Clear to auscultation bilaterally. Effort and breath sounds normal, no problems with respiration noted. BREASTS: Symmetric in size. No masses, skin changes, nipple drainage, or lymphadenopathy. ABDOMEN: Soft, normal bowel sounds, no distention noted.  No tenderness, rebound or guarding.  PELVIC: Normal appearing external genitalia; normal appearing vaginal mucosa and cervix.  No abnormal discharge noted. Nuvaring in place.  Pap smear obtained.  Normal uterine size, no other palpable masses, no uterine or adnexal tenderness. MUSCULOSKELETAL: Normal range of motion. No tenderness.  No cyanosis, clubbing, or edema.  2+ distal pulses.   Assessment:  Annual gynecologic examination with pap smear Healthcare maintenance labs drawn today Endometriosis controlled on Nuvaring   Plan:  Will follow up results of pap smear and labs and manage accordingly. Mammogram is uptodate Continue Nuvaring for endometriosis, refill ordered Routine preventative health maintenance measures emphasized. Please refer to After Visit Summary for other counseling recommendations.    Jaynie CollinsUGONNA  Chevy Sweigert, MD, FACOG Attending Obstetrician & Gynecologist, [facility]

## 2016-02-15 NOTE — Patient Instructions (Signed)
Preventive Care for Adults, Female A healthy lifestyle and preventive care can promote health and wellness. Preventive health guidelines for women include the following key practices.  A routine yearly physical is a good way to check with your health care provider about your health and preventive screening. It is a chance to share any concerns and updates on your health and to receive a thorough exam.  Visit your dentist for a routine exam and preventive care every 6 months. Brush your teeth twice a day and floss once a day. Good oral hygiene prevents tooth decay and gum disease.  The frequency of eye exams is based on your age, health, family medical history, use of contact lenses, and other factors. Follow your health care provider's recommendations for frequency of eye exams.  Eat a healthy diet. Foods like vegetables, fruits, whole grains, low-fat dairy products, and lean protein foods contain the nutrients you need without too many calories. Decrease your intake of foods high in solid fats, added sugars, and salt. Eat the right amount of calories for you.Get information about a proper diet from your health care provider, if necessary.  Regular physical exercise is one of the most important things you can do for your health. Most adults should get at least 150 minutes of moderate-intensity exercise (any activity that increases your heart rate and causes you to sweat) each week. In addition, most adults need muscle-strengthening exercises on 2 or more days a week.  Maintain a healthy weight. The body mass index (BMI) is a screening tool to identify possible weight problems. It provides an estimate of body fat based on height and weight. Your health care provider can find your BMI and can help you achieve or maintain a healthy weight.For adults 20 years and older:  A BMI below 18.5 is considered underweight.  A BMI of 18.5 to 24.9 is normal.  A BMI of 25 to 29.9 is considered overweight.  A  BMI of 30 and above is considered obese.  Maintain normal blood lipids and cholesterol levels by exercising and minimizing your intake of saturated fat. Eat a balanced diet with plenty of fruit and vegetables. Blood tests for lipids and cholesterol should begin at age 45 and be repeated every 5 years. If your lipid or cholesterol levels are high, you are over 50, or you are at high risk for heart disease, you may need your cholesterol levels checked more frequently.Ongoing high lipid and cholesterol levels should be treated with medicines if diet and exercise are not working.  If you smoke, find out from your health care provider how to quit. If you do not use tobacco, do not start.  Lung cancer screening is recommended for adults aged 45-80 years who are at high risk for developing lung cancer because of a history of smoking. A yearly low-dose CT scan of the lungs is recommended for people who have at least a 30-pack-year history of smoking and are a current smoker or have quit within the past 15 years. A pack year of smoking is smoking an average of 1 pack of cigarettes a day for 1 year (for example: 1 pack a day for 30 years or 2 packs a day for 15 years). Yearly screening should continue until the smoker has stopped smoking for at least 15 years. Yearly screening should be stopped for people who develop a health problem that would prevent them from having lung cancer treatment.  If you are pregnant, do not drink alcohol. If you are  breastfeeding, be very cautious about drinking alcohol. If you are not pregnant and choose to drink alcohol, do not have more than 1 drink per day. One drink is considered to be 12 ounces (355 mL) of beer, 5 ounces (148 mL) of wine, or 1.5 ounces (44 mL) of liquor.  Avoid use of street drugs. Do not share needles with anyone. Ask for help if you need support or instructions about stopping the use of drugs.  High blood pressure causes heart disease and increases the risk  of stroke. Your blood pressure should be checked at least every 1 to 2 years. Ongoing high blood pressure should be treated with medicines if weight loss and exercise do not work.  If you are 55-79 years old, ask your health care provider if you should take aspirin to prevent strokes.  Diabetes screening is done by taking a blood sample to check your blood glucose level after you have not eaten for a certain period of time (fasting). If you are not overweight and you do not have risk factors for diabetes, you should be screened once every 3 years starting at age 45. If you are overweight or obese and you are 40-70 years of age, you should be screened for diabetes every year as part of your cardiovascular risk assessment.  Breast cancer screening is essential preventive care for women. You should practice "breast self-awareness." This means understanding the normal appearance and feel of your breasts and may include breast self-examination. Any changes detected, no matter how small, should be reported to a health care provider. Women in their 20s and 30s should have a clinical breast exam (CBE) by a health care provider as part of a regular health exam every 1 to 3 years. After age 40, women should have a CBE every year. Starting at age 40, women should consider having a mammogram (breast X-ray test) every year. Women who have a family history of breast cancer should talk to their health care provider about genetic screening. Women at a high risk of breast cancer should talk to their health care providers about having an MRI and a mammogram every year.  Breast cancer gene (BRCA)-related cancer risk assessment is recommended for women who have family members with BRCA-related cancers. BRCA-related cancers include breast, ovarian, tubal, and peritoneal cancers. Having family members with these cancers may be associated with an increased risk for harmful changes (mutations) in the breast cancer genes BRCA1 and  BRCA2. Results of the assessment will determine the need for genetic counseling and BRCA1 and BRCA2 testing.  Your health care provider may recommend that you be screened regularly for cancer of the pelvic organs (ovaries, uterus, and vagina). This screening involves a pelvic examination, including checking for microscopic changes to the surface of your cervix (Pap test). You may be encouraged to have this screening done every 3 years, beginning at age 21.  For women ages 30-65, health care providers may recommend pelvic exams and Pap testing every 3 years, or they may recommend the Pap and pelvic exam, combined with testing for human papilloma virus (HPV), every 5 years. Some types of HPV increase your risk of cervical cancer. Testing for HPV may also be done on women of any age with unclear Pap test results.  Other health care providers may not recommend any screening for nonpregnant women who are considered low risk for pelvic cancer and who do not have symptoms. Ask your health care provider if a screening pelvic exam is right for   you.  If you have had past treatment for cervical cancer or a condition that could lead to cancer, you need Pap tests and screening for cancer for at least 20 years after your treatment. If Pap tests have been discontinued, your risk factors (such as having a new sexual partner) need to be reassessed to determine if screening should resume. Some women have medical problems that increase the chance of getting cervical cancer. In these cases, your health care provider may recommend more frequent screening and Pap tests.  Colorectal cancer can be detected and often prevented. Most routine colorectal cancer screening begins at the age of 50 years and continues through age 75 years. However, your health care provider may recommend screening at an earlier age if you have risk factors for colon cancer. On a yearly basis, your health care provider may provide home test kits to check  for hidden blood in the stool. Use of a small camera at the end of a tube, to directly examine the colon (sigmoidoscopy or colonoscopy), can detect the earliest forms of colorectal cancer. Talk to your health care provider about this at age 50, when routine screening begins. Direct exam of the colon should be repeated every 5-10 years through age 75 years, unless early forms of precancerous polyps or small growths are found.  People who are at an increased risk for hepatitis B should be screened for this virus. You are considered at high risk for hepatitis B if:  You were born in a country where hepatitis B occurs often. Talk with your health care provider about which countries are considered high risk.  Your parents were born in a high-risk country and you have not received a shot to protect against hepatitis B (hepatitis B vaccine).  You have HIV or AIDS.  You use needles to inject street drugs.  You live with, or have sex with, someone who has hepatitis B.  You get hemodialysis treatment.  You take certain medicines for conditions like cancer, organ transplantation, and autoimmune conditions.  Hepatitis C blood testing is recommended for all people born from 1945 through 1965 and any individual with known risks for hepatitis C.  Practice safe sex. Use condoms and avoid high-risk sexual practices to reduce the spread of sexually transmitted infections (STIs). STIs include gonorrhea, chlamydia, syphilis, trichomonas, herpes, HPV, and human immunodeficiency virus (HIV). Herpes, HIV, and HPV are viral illnesses that have no cure. They can result in disability, cancer, and death.  You should be screened for sexually transmitted illnesses (STIs) including gonorrhea and chlamydia if:  You are sexually active and are younger than 24 years.  You are older than 24 years and your health care provider tells you that you are at risk for this type of infection.  Your sexual activity has changed  since you were last screened and you are at an increased risk for chlamydia or gonorrhea. Ask your health care provider if you are at risk.  If you are at risk of being infected with HIV, it is recommended that you take a prescription medicine daily to prevent HIV infection. This is called preexposure prophylaxis (PrEP). You are considered at risk if:  You are sexually active and do not regularly use condoms or know the HIV status of your partner(s).  You take drugs by injection.  You are sexually active with a partner who has HIV.  Talk with your health care provider about whether you are at high risk of being infected with HIV. If   you choose to begin PrEP, you should first be tested for HIV. You should then be tested every 3 months for as long as you are taking PrEP.  Osteoporosis is a disease in which the bones lose minerals and strength with aging. This can result in serious bone fractures or breaks. The risk of osteoporosis can be identified using a bone density scan. Women ages 67 years and over and women at risk for fractures or osteoporosis should discuss screening with their health care providers. Ask your health care provider whether you should take a calcium supplement or vitamin D to reduce the rate of osteoporosis.  Menopause can be associated with physical symptoms and risks. Hormone replacement therapy is available to decrease symptoms and risks. You should talk to your health care provider about whether hormone replacement therapy is right for you.  Use sunscreen. Apply sunscreen liberally and repeatedly throughout the day. You should seek shade when your shadow is shorter than you. Protect yourself by wearing long sleeves, pants, a wide-brimmed hat, and sunglasses year round, whenever you are outdoors.  Once a month, do a whole body skin exam, using a mirror to look at the skin on your back. Tell your health care provider of new moles, moles that have irregular borders, moles that  are larger than a pencil eraser, or moles that have changed in shape or color.  Stay current with required vaccines (immunizations).  Influenza vaccine. All adults should be immunized every year.  Tetanus, diphtheria, and acellular pertussis (Td, Tdap) vaccine. Pregnant women should receive 1 dose of Tdap vaccine during each pregnancy. The dose should be obtained regardless of the length of time since the last dose. Immunization is preferred during the 27th-36th week of gestation. An adult who has not previously received Tdap or who does not know her vaccine status should receive 1 dose of Tdap. This initial dose should be followed by tetanus and diphtheria toxoids (Td) booster doses every 10 years. Adults with an unknown or incomplete history of completing a 3-dose immunization series with Td-containing vaccines should begin or complete a primary immunization series including a Tdap dose. Adults should receive a Td booster every 10 years.  Varicella vaccine. An adult without evidence of immunity to varicella should receive 2 doses or a second dose if she has previously received 1 dose. Pregnant females who do not have evidence of immunity should receive the first dose after pregnancy. This first dose should be obtained before leaving the health care facility. The second dose should be obtained 4-8 weeks after the first dose.  Human papillomavirus (HPV) vaccine. Females aged 13-26 years who have not received the vaccine previously should obtain the 3-dose series. The vaccine is not recommended for use in pregnant females. However, pregnancy testing is not needed before receiving a dose. If a female is found to be pregnant after receiving a dose, no treatment is needed. In that case, the remaining doses should be delayed until after the pregnancy. Immunization is recommended for any person with an immunocompromised condition through the age of 61 years if she did not get any or all doses earlier. During the  3-dose series, the second dose should be obtained 4-8 weeks after the first dose. The third dose should be obtained 24 weeks after the first dose and 16 weeks after the second dose.  Zoster vaccine. One dose is recommended for adults aged 30 years or older unless certain conditions are present.  Measles, mumps, and rubella (MMR) vaccine. Adults born  before 1957 generally are considered immune to measles and mumps. Adults born in 1957 or later should have 1 or more doses of MMR vaccine unless there is a contraindication to the vaccine or there is laboratory evidence of immunity to each of the three diseases. A routine second dose of MMR vaccine should be obtained at least 28 days after the first dose for students attending postsecondary schools, health care workers, or international travelers. People who received inactivated measles vaccine or an unknown type of measles vaccine during 1963-1967 should receive 2 doses of MMR vaccine. People who received inactivated mumps vaccine or an unknown type of mumps vaccine before 1979 and are at high risk for mumps infection should consider immunization with 2 doses of MMR vaccine. For females of childbearing age, rubella immunity should be determined. If there is no evidence of immunity, females who are not pregnant should be vaccinated. If there is no evidence of immunity, females who are pregnant should delay immunization until after pregnancy. Unvaccinated health care workers born before 1957 who lack laboratory evidence of measles, mumps, or rubella immunity or laboratory confirmation of disease should consider measles and mumps immunization with 2 doses of MMR vaccine or rubella immunization with 1 dose of MMR vaccine.  Pneumococcal 13-valent conjugate (PCV13) vaccine. When indicated, a person who is uncertain of his immunization history and has no record of immunization should receive the PCV13 vaccine. All adults 65 years of age and older should receive this  vaccine. An adult aged 19 years or older who has certain medical conditions and has not been previously immunized should receive 1 dose of PCV13 vaccine. This PCV13 should be followed with a dose of pneumococcal polysaccharide (PPSV23) vaccine. Adults who are at high risk for pneumococcal disease should obtain the PPSV23 vaccine at least 8 weeks after the dose of PCV13 vaccine. Adults older than 45 years of age who have normal immune system function should obtain the PPSV23 vaccine dose at least 1 year after the dose of PCV13 vaccine.  Pneumococcal polysaccharide (PPSV23) vaccine. When PCV13 is also indicated, PCV13 should be obtained first. All adults aged 65 years and older should be immunized. An adult younger than age 65 years who has certain medical conditions should be immunized. Any person who resides in a nursing home or long-term care facility should be immunized. An adult smoker should be immunized. People with an immunocompromised condition and certain other conditions should receive both PCV13 and PPSV23 vaccines. People with human immunodeficiency virus (HIV) infection should be immunized as soon as possible after diagnosis. Immunization during chemotherapy or radiation therapy should be avoided. Routine use of PPSV23 vaccine is not recommended for American Indians, [place] Natives, or people younger than 65 years unless there are medical conditions that require PPSV23 vaccine. When indicated, people who have unknown immunization and have no record of immunization should receive PPSV23 vaccine. One-time revaccination 5 years after the first dose of PPSV23 is recommended for people aged 19-64 years who have chronic kidney failure, nephrotic syndrome, asplenia, or immunocompromised conditions. People who received 1-2 doses of PPSV23 before age 65 years should receive another dose of PPSV23 vaccine at age 65 years or later if at least 5 years have passed since the previous dose. Doses of PPSV23 are not  needed for people immunized with PPSV23 at or after age 65 years.  Meningococcal vaccine. Adults with asplenia or persistent complement component deficiencies should receive 2 doses of quadrivalent meningococcal conjugate (MenACWY-D) vaccine. The doses should be obtained   at least 2 months apart. Microbiologists working with certain meningococcal bacteria, [place] recruits, people at risk during an outbreak, and people who travel to or live in countries with a high rate of meningitis should be immunized. A first-year college student up through age 34 years who is living in a residence hall should receive a dose if she did not receive a dose on or after her 16th birthday. Adults who have certain high-risk conditions should receive one or more doses of vaccine.  Hepatitis A vaccine. Adults who wish to be protected from this disease, have certain high-risk conditions, work with hepatitis A-infected animals, work in hepatitis A research labs, or travel to or work in countries with a high rate of hepatitis A should be immunized. Adults who were previously unvaccinated and who anticipate close contact with an international adoptee during the first 60 days after arrival in the [place] from a country with a high rate of hepatitis A should be immunized.  Hepatitis B vaccine. Adults who wish to be protected from this disease, have certain high-risk conditions, may be exposed to blood or other infectious body fluids, are household contacts or sex partners of hepatitis B positive people, are clients or workers in certain care facilities, or travel to or work in countries with a high rate of hepatitis B should be immunized.  Haemophilus influenzae type b (Hib) vaccine. A previously unvaccinated person with asplenia or sickle cell disease or having a scheduled splenectomy should receive 1 dose of Hib vaccine. Regardless of previous immunization, a recipient of a hematopoietic stem cell transplant should receive a  3-dose series 6-12 months after her successful transplant. Hib vaccine is not recommended for adults with HIV infection. Preventive Services / Frequency Ages 35 to 4 years  Blood pressure check.** / Every 3-5 years.  Lipid and cholesterol check.** / Every 5 years beginning at age 60.  Clinical breast exam.** / Every 3 years for women in their 71s and 10s.  BRCA-related cancer risk assessment.** / For women who have family members with a BRCA-related cancer (breast, ovarian, tubal, or peritoneal cancers).  Pap test.** / Every 2 years from ages 76 through 26. Every 3 years starting at age 61 through age 76 or 93 with a history of 3 consecutive normal Pap tests.  HPV screening.** / Every 3 years from ages 37 through ages 60 to 51 with a history of 3 consecutive normal Pap tests.  Hepatitis C blood test.** / For any individual with known risks for hepatitis C.  Skin self-exam. / Monthly.  Influenza vaccine. / Every year.  Tetanus, diphtheria, and acellular pertussis (Tdap, Td) vaccine.** / Consult your health care provider. Pregnant women should receive 1 dose of Tdap vaccine during each pregnancy. 1 dose of Td every 10 years.  Varicella vaccine.** / Consult your health care provider. Pregnant females who do not have evidence of immunity should receive the first dose after pregnancy.  HPV vaccine. / 3 doses over 6 months, if 93 and younger. The vaccine is not recommended for use in pregnant females. However, pregnancy testing is not needed before receiving a dose.  Measles, mumps, rubella (MMR) vaccine.** / You need at least 1 dose of MMR if you were born in 1957 or later. You may also need a 2nd dose. For females of childbearing age, rubella immunity should be determined. If there is no evidence of immunity, females who are not pregnant should be vaccinated. If there is no evidence of immunity, females who are  pregnant should delay immunization until after pregnancy.  Pneumococcal  13-valent conjugate (PCV13) vaccine.** / Consult your health care provider.  Pneumococcal polysaccharide (PPSV23) vaccine.** / 1 to 2 doses if you smoke cigarettes or if you have certain conditions.  Meningococcal vaccine.** / 1 dose if you are age 68 to 8 years and a Market researcher living in a residence hall, or have one of several medical conditions, you need to get vaccinated against meningococcal disease. You may also need additional booster doses.  Hepatitis A vaccine.** / Consult your health care provider.  Hepatitis B vaccine.** / Consult your health care provider.  Haemophilus influenzae type b (Hib) vaccine.** / Consult your health care provider. Ages 7 to 53 years  Blood pressure check.** / Every year.  Lipid and cholesterol check.** / Every 5 years beginning at age 25 years.  Lung cancer screening. / Every year if you are aged 11-80 years and have a 30-pack-year history of smoking and currently smoke or have quit within the past 15 years. Yearly screening is stopped once you have quit smoking for at least 15 years or develop a health problem that would prevent you from having lung cancer treatment.  Clinical breast exam.** / Every year after age 48 years.  BRCA-related cancer risk assessment.** / For women who have family members with a BRCA-related cancer (breast, ovarian, tubal, or peritoneal cancers).  Mammogram.** / Every year beginning at age 41 years and continuing for as long as you are in good health. Consult with your health care provider.  Pap test.** / Every 3 years starting at age 65 years through age 37 or 70 years with a history of 3 consecutive normal Pap tests.  HPV screening.** / Every 3 years from ages 72 years through ages 60 to 40 years with a history of 3 consecutive normal Pap tests.  Fecal occult blood test (FOBT) of stool. / Every year beginning at age 21 years and continuing until age 5 years. You may not need to do this test if you get  a colonoscopy every 10 years.  Flexible sigmoidoscopy or colonoscopy.** / Every 5 years for a flexible sigmoidoscopy or every 10 years for a colonoscopy beginning at age 35 years and continuing until age 48 years.  Hepatitis C blood test.** / For all people born from [birth year] through 1965 and any individual with known risks for hepatitis C.  Skin self-exam. / Monthly.  Influenza vaccine. / Every year.  Tetanus, diphtheria, and acellular pertussis (Tdap/Td) vaccine.** / Consult your health care provider. Pregnant women should receive 1 dose of Tdap vaccine during each pregnancy. 1 dose of Td every 10 years.  Varicella vaccine.** / Consult your health care provider. Pregnant females who do not have evidence of immunity should receive the first dose after pregnancy.  Zoster vaccine.** / 1 dose for adults aged 30 years or older.  Measles, mumps, rubella (MMR) vaccine.** / You need at least 1 dose of MMR if you were born in 1957 or later. You may also need a second dose. For females of childbearing age, rubella immunity should be determined. If there is no evidence of immunity, females who are not pregnant should be vaccinated. If there is no evidence of immunity, females who are pregnant should delay immunization until after pregnancy.  Pneumococcal 13-valent conjugate (PCV13) vaccine.** / Consult your health care provider.  Pneumococcal polysaccharide (PPSV23) vaccine.** / 1 to 2 doses if you smoke cigarettes or if you have certain conditions.  Meningococcal vaccine.** /  Consult your health care provider.  Hepatitis A vaccine.** / Consult your health care provider.  Hepatitis B vaccine.** / Consult your health care provider.  Haemophilus influenzae type b (Hib) vaccine.** / Consult your health care provider. Ages 64 years and over  Blood pressure check.** / Every year.  Lipid and cholesterol check.** / Every 5 years beginning at age 23 years.  Lung cancer screening. / Every year if you  are aged 16-80 years and have a 30-pack-year history of smoking and currently smoke or have quit within the past 15 years. Yearly screening is stopped once you have quit smoking for at least 15 years or develop a health problem that would prevent you from having lung cancer treatment.  Clinical breast exam.** / Every year after age 74 years.  BRCA-related cancer risk assessment.** / For women who have family members with a BRCA-related cancer (breast, ovarian, tubal, or peritoneal cancers).  Mammogram.** / Every year beginning at age 44 years and continuing for as long as you are in good health. Consult with your health care provider.  Pap test.** / Every 3 years starting at age 58 years through age 22 or 39 years with 3 consecutive normal Pap tests. Testing can be stopped between 65 and 70 years with 3 consecutive normal Pap tests and no abnormal Pap or HPV tests in the past 10 years.  HPV screening.** / Every 3 years from ages 64 years through ages 70 or 61 years with a history of 3 consecutive normal Pap tests. Testing can be stopped between 65 and 70 years with 3 consecutive normal Pap tests and no abnormal Pap or HPV tests in the past 10 years.  Fecal occult blood test (FOBT) of stool. / Every year beginning at age 40 years and continuing until age 27 years. You may not need to do this test if you get a colonoscopy every 10 years.  Flexible sigmoidoscopy or colonoscopy.** / Every 5 years for a flexible sigmoidoscopy or every 10 years for a colonoscopy beginning at age 7 years and continuing until age 32 years.  Hepatitis C blood test.** / For all people born from [birth year] through 1965 and any individual with known risks for hepatitis C.  Osteoporosis screening.** / A one-time screening for women ages 30 years and over and women at risk for fractures or osteoporosis.  Skin self-exam. / Monthly.  Influenza vaccine. / Every year.  Tetanus, diphtheria, and acellular pertussis (Tdap/Td)  vaccine.** / 1 dose of Td every 10 years.  Varicella vaccine.** / Consult your health care provider.  Zoster vaccine.** / 1 dose for adults aged 35 years or older.  Pneumococcal 13-valent conjugate (PCV13) vaccine.** / Consult your health care provider.  Pneumococcal polysaccharide (PPSV23) vaccine.** / 1 dose for all adults aged 46 years and older.  Meningococcal vaccine.** / Consult your health care provider.  Hepatitis A vaccine.** / Consult your health care provider.  Hepatitis B vaccine.** / Consult your health care provider.  Haemophilus influenzae type b (Hib) vaccine.** / Consult your health care provider. ** Family history and personal history of risk and conditions may change your health care provider's recommendations.   This information is not intended to replace advice given to you by your health care provider. Make sure you discuss any questions you have with your health care provider.   Document Released: 09/23/2001 Document Revised: 08/18/2014 Document Reviewed: 12/23/2010 Elsevier Interactive Patient Education [employer].

## 2016-02-16 LAB — COMPREHENSIVE METABOLIC PANEL
ALT: 12 U/L (ref 6–29)
AST: 17 U/L (ref 10–35)
Albumin: 3.6 g/dL (ref 3.6–5.1)
Alkaline Phosphatase: 58 U/L (ref 33–115)
BUN: 10 mg/dL (ref 7–25)
CHLORIDE: 110 mmol/L (ref 98–110)
CO2: 19 mmol/L — ABNORMAL LOW (ref 20–31)
Calcium: 8.5 mg/dL — ABNORMAL LOW (ref 8.6–10.2)
Creat: 0.69 mg/dL (ref 0.50–1.10)
GLUCOSE: 70 mg/dL (ref 65–99)
POTASSIUM: 4.3 mmol/L (ref 3.5–5.3)
Sodium: 142 mmol/L (ref 135–146)
Total Bilirubin: 0.4 mg/dL (ref 0.2–1.2)
Total Protein: 6.4 g/dL (ref 6.1–8.1)

## 2016-02-16 LAB — LIPID PANEL
Cholesterol: 155 mg/dL (ref 125–200)
HDL: 70 mg/dL (ref >=46)
LDL CALC: 67 mg/dL (ref <130)
TRIGLYCERIDES: 90 mg/dL (ref <150)
Total CHOL/HDL Ratio: 2.2 Ratio (ref <=5.0)
VLDL: 18 mg/dL (ref <30)

## 2016-02-16 LAB — HEPATITIS B SURFACE ANTIGEN: Hepatitis B Surface Ag: NEGATIVE

## 2016-02-16 LAB — HEPATITIS C ANTIBODY: HCV Ab: NEGATIVE

## 2016-02-16 LAB — VITAMIN D 25 HYDROXY (VIT D DEFICIENCY, FRACTURES): VIT D 25 HYDROXY: 43 ng/mL (ref 30–100)

## 2016-02-16 LAB — RPR

## 2016-02-16 LAB — HIV ANTIBODY (ROUTINE TESTING W REFLEX): HIV 1&2 Ab, 4th Generation: NONREACTIVE

## 2016-02-17 LAB — VITAMIN D 1,25 DIHYDROXY
VITAMIN D 1, 25 (OH) TOTAL: 92 pg/mL — AB (ref 18–72)
VITAMIN D3 1, 25 (OH): 92 pg/mL

## 2016-02-18 LAB — CYTOLOGY - PAP

## 2016-02-22 ENCOUNTER — Telehealth: Payer: Self-pay | Admitting: *Deleted

## 2016-02-22 NOTE — Telephone Encounter (Signed)
Pt states she has a problem with her foot and has lost her brace. Pt's LOV 02/2015.  I informed pt, her last appt was in 2016, that if she was still having a problem with or without the brace, she needed to come in to be reevaluated.  Pt states understanding and I transferred her to schedulers.

## 2016-02-25 ENCOUNTER — Telehealth: Payer: Self-pay | Admitting: *Deleted

## 2016-02-25 NOTE — Telephone Encounter (Signed)
Informed pt of result and scheduled Colpo for 03-17-16 with Dr Macon LargeAnyanwu.

## 2016-02-25 NOTE — Telephone Encounter (Signed)
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   Needs colposcopy, please schedule.  Please call to inform patient of results and recommendations.

## 2016-02-25 NOTE — Telephone Encounter (Signed)
-----   Message from Ugonna A Anyanwu, MD sent at 02/23/2016  8:42 AM EDT ----- Normal pap smear but positive high-risk HPV 16, 18/45 on 02/25/2016.   Needs colposcopy, please schedule.  Please call to inform patient of results and recommendations. 

## 2016-02-25 NOTE — Telephone Encounter (Signed)
Called pt, no answer, left message to call the office.  

## 2016-02-28 ENCOUNTER — Telehealth: Payer: Self-pay | Admitting: *Deleted

## 2016-02-28 NOTE — Telephone Encounter (Signed)
-----   Message from Olevia BowensJacinda S Battle sent at 02/28/2016 10:07 AM EDT ----- Regarding: Advise Contact: [phone] Wants to talk to you about her lab results

## 2016-02-28 NOTE — Telephone Encounter (Signed)
Pt has colposcopy scheduled for 03-06-16, had a few questions about procedure.  Explained procedure, process, and what to expect.  No further questions at this time.

## 2016-03-03 ENCOUNTER — Ambulatory Visit (INDEPENDENT_AMBULATORY_CARE_PROVIDER_SITE_OTHER): Payer: Managed Care, Other (non HMO) | Admitting: Podiatry

## 2016-03-03 ENCOUNTER — Encounter: Payer: Self-pay | Admitting: Podiatry

## 2016-03-03 VITALS — BP 110/70 | HR 69 | Resp 12

## 2016-03-03 DIAGNOSIS — M778 Other enthesopathies, not elsewhere classified: Secondary | ICD-10-CM

## 2016-03-03 DIAGNOSIS — M7751 Other enthesopathy of right foot: Secondary | ICD-10-CM | POA: Diagnosis not present

## 2016-03-03 DIAGNOSIS — M779 Enthesopathy, unspecified: Principal | ICD-10-CM

## 2016-03-03 MED ORDER — DICLOFENAC SODIUM 1 % TD GEL: 4.0000 g | Freq: Four times a day (QID) | TRANSDERMAL | 4 refills | Status: DC

## 2016-03-03 NOTE — Progress Notes (Signed)
She presents today with a 6 month duration of pain subtalar joint of the right foot.  Objective: Pulses are palpable. She has pain on end range of motion of the subtalar joint of the right foot with pain on palpation of the sinus tarsi right foot.  Assessment: Capsulitis subtalar joint right foot.  Plan: I injected the area today with Kenalog and local anesthetic after sterile Betadine skin prep. And placed her in a Tri-Lock brace. We'll follow-up with her as needed. I also wrote a prescription for diclofenac gel to be applied 4 times a day.  Arbutus Ped DPM

## 2016-03-05 ENCOUNTER — Ambulatory Visit: Payer: Managed Care, Other (non HMO) | Admitting: Podiatry

## 2016-03-06 ENCOUNTER — Ambulatory Visit (INDEPENDENT_AMBULATORY_CARE_PROVIDER_SITE_OTHER): Payer: Managed Care, Other (non HMO) | Admitting: Obstetrics & Gynecology

## 2016-03-06 ENCOUNTER — Encounter: Payer: Self-pay | Admitting: Obstetrics & Gynecology

## 2016-03-06 VITALS — BP 159/82 | HR 76 | Ht 63.0 in | Wt 136.0 lb

## 2016-03-06 DIAGNOSIS — R87810 Cervical high risk human papillomavirus (HPV) DNA test positive: Secondary | ICD-10-CM | POA: Diagnosis not present

## 2016-03-06 DIAGNOSIS — B977 Papillomavirus as the cause of diseases classified elsewhere: Secondary | ICD-10-CM

## 2016-03-06 DIAGNOSIS — Z01812 Encounter for preprocedural laboratory examination: Secondary | ICD-10-CM | POA: Diagnosis not present

## 2016-03-06 LAB — POCT URINE PREGNANCY: PREG TEST UR: NEGATIVE

## 2016-03-06 NOTE — Progress Notes (Signed)
   Subjective:    Patient ID: [name] [name], female    DOB: 04-Apr-1971, 45 y.o.   MRN: [id number]  HPI  45 yo AA lady here for a colpo. Her paps have always been normal until this month when her pap was negative but she was + for HPV 16/18.   Review of Systems She and her boyfriend of 11 months had a 4 month "break". He had sex with someone else during that time.    Objective:   Physical Exam  Thin, anxious BF Breathing, conversing, and ambulating normally UPT negative, consent signed, time out done Cervix prepped with acetic acid. Transformation zone seen in its entirety. Colpo adequate. Entirely normal colpo. ECC obtained. She tolerated the procedure well.        Assessment & Plan:  + HR HPV (16/18) with negative pap and normal colpo. If ECC is negative, then pap and cotesting in a year.

## 2016-03-07 ENCOUNTER — Encounter: Payer: Self-pay | Admitting: *Deleted

## 2016-03-12 ENCOUNTER — Telehealth: Payer: Self-pay | Admitting: *Deleted

## 2016-03-12 NOTE — Telephone Encounter (Signed)
-----   Message from Olevia Bowens sent at 03/12/2016  1:16 PM EDT ----- Regarding: Colpo  Contact: [phone] Called about colpo results

## 2016-03-12 NOTE — Telephone Encounter (Signed)
Pt called requesting results from Colpo, informed her of negative ECC and to follow-up with repeat pap smear in one year.

## 2016-03-17 ENCOUNTER — Encounter: Payer: Self-pay | Admitting: Obstetrics & Gynecology

## 2016-03-20 ENCOUNTER — Other Ambulatory Visit: Payer: Self-pay | Admitting: Physician Assistant

## 2016-05-01 ENCOUNTER — Encounter: Payer: Self-pay | Admitting: Physician Assistant

## 2016-05-01 ENCOUNTER — Ambulatory Visit: Payer: Self-pay | Admitting: Physician Assistant

## 2016-05-01 VITALS — BP 114/58 | HR 72 | Temp 98.3°F

## 2016-05-01 DIAGNOSIS — J0100 Acute maxillary sinusitis, unspecified: Secondary | ICD-10-CM

## 2016-05-01 MED ORDER — PREDNISONE 10 MG PO TABS: 30.0000 mg | ORAL_TABLET | Freq: Every day | ORAL | 0 refills | Status: DC

## 2016-05-01 MED ORDER — AMOXICILLIN 875 MG PO TABS: 875.0000 mg | ORAL_TABLET | Freq: Two times a day (BID) | ORAL | 0 refills | Status: DC

## 2016-05-01 MED ORDER — FLUCONAZOLE 150 MG PO TABS: ORAL_TABLET | ORAL | 0 refills | Status: DC

## 2016-05-01 NOTE — Progress Notes (Signed)
S: C/o sinus pain and pressure for 3 days, no fever, chills, cp/sob, v/d; no mucus production; c/o of facial and dental pain. Pain severe r max area  Using otc meds:   O: PE: vitals wnl, nad,  perrl eomi, normocephalic, tms dull, nasal mucosa red and swollen on r, left side is boggy, throat injected, neck supple no lymph, lungs c t a, cv rrr, neuro intact  A:  Acute sinusitis   P: drink fluids, continue regular meds , use otc meds of choice, return if not improving in 5 days, return earlier if worsening , amoxil, pred 30mg  x 3d, diflucan

## 2016-05-14 ENCOUNTER — Emergency Department
Admission: EM | Admit: 2016-05-14 | Discharge: 2016-05-14 | Disposition: A | Discharge status: Home / Self Care | Payer: Managed Care, Other (non HMO) | Attending: Emergency Medicine | Admitting: Emergency Medicine

## 2016-05-14 ENCOUNTER — Encounter: Payer: Self-pay | Admitting: *Deleted

## 2016-05-14 DIAGNOSIS — M546 Pain in thoracic spine: Secondary | ICD-10-CM | POA: Diagnosis present

## 2016-05-14 DIAGNOSIS — Z79899 Other long term (current) drug therapy: Secondary | ICD-10-CM | POA: Diagnosis not present

## 2016-05-14 DIAGNOSIS — Y92410 Unspecified street and highway as the place of occurrence of the external cause: Secondary | ICD-10-CM | POA: Diagnosis not present

## 2016-05-14 DIAGNOSIS — S39012A Strain of muscle, fascia and tendon of lower back, initial encounter: Secondary | ICD-10-CM | POA: Insufficient documentation

## 2016-05-14 DIAGNOSIS — J45909 Unspecified asthma, uncomplicated: Secondary | ICD-10-CM | POA: Diagnosis not present

## 2016-05-14 DIAGNOSIS — S50811A Abrasion of right forearm, initial encounter: Secondary | ICD-10-CM | POA: Diagnosis not present

## 2016-05-14 DIAGNOSIS — S161XXA Strain of muscle, fascia and tendon at neck level, initial encounter: Secondary | ICD-10-CM | POA: Insufficient documentation

## 2016-05-14 DIAGNOSIS — Y9389 Activity, other specified: Secondary | ICD-10-CM | POA: Diagnosis not present

## 2016-05-14 DIAGNOSIS — Y999 Unspecified external cause status: Secondary | ICD-10-CM | POA: Diagnosis not present

## 2016-05-14 MED ORDER — BACITRACIN ZINC 500 UNIT/GM EX OINT
TOPICAL_OINTMENT | Freq: Once | CUTANEOUS | Status: AC
  Administered 2016-05-14: 1 via TOPICAL
  Filled 2016-05-14: qty 0.9

## 2016-05-14 MED ORDER — CYCLOBENZAPRINE HCL 5 MG PO TABS: 5.0000 mg | ORAL_TABLET | Freq: Three times a day (TID) | ORAL | 0 refills | Status: DC | PRN

## 2016-05-14 MED ORDER — NABUMETONE 750 MG PO TABS: 750.0000 mg | ORAL_TABLET | Freq: Two times a day (BID) | ORAL | 0 refills | Status: DC

## 2016-05-14 MED ORDER — NABUMETONE 750 MG PO TABS
750.0000 mg | ORAL_TABLET | Freq: Once | ORAL | Status: AC
  Administered 2016-05-14: 750 mg via ORAL
  Filled 2016-05-14: qty 1

## 2016-05-14 MED ORDER — CYCLOBENZAPRINE HCL 10 MG PO TABS
5.0000 mg | ORAL_TABLET | Freq: Once | ORAL | Status: AC
  Administered 2016-05-14: 5 mg via ORAL
  Filled 2016-05-14: qty 1

## 2016-05-14 NOTE — Discharge Instructions (Signed)
You should take the prescription meds as directed. Apply ice to any sore muscles. Keep the abrasions covered with antibiotic ointment. Follow-up with Dr. Carlynn PurlSowles if symptoms persist.
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
## 2016-05-14 NOTE — ED Notes (Signed)
Pt arm wrapped with gauze.
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
## 2016-05-19 ENCOUNTER — Encounter: Payer: Self-pay | Admitting: Physician Assistant

## 2016-05-19 ENCOUNTER — Ambulatory Visit: Payer: Self-pay | Admitting: Physician Assistant

## 2016-05-19 ENCOUNTER — Ambulatory Visit
Admission: RE | Admit: 2016-05-19 | Discharge: 2016-05-19 | Disposition: A | Discharge status: Home / Self Care | Payer: Managed Care, Other (non HMO) | Source: Ambulatory Visit | Attending: Physician Assistant | Admitting: Physician Assistant

## 2016-05-19 VITALS — BP 120/70 | HR 62 | Temp 98.6°F

## 2016-05-19 DIAGNOSIS — M25551 Pain in right hip: Secondary | ICD-10-CM | POA: Insufficient documentation

## 2016-05-19 DIAGNOSIS — M898X5 Other specified disorders of bone, thigh: Secondary | ICD-10-CM

## 2016-05-19 MED ORDER — CYCLOBENZAPRINE HCL 5 MG PO TABS: 5.0000 mg | ORAL_TABLET | Freq: Three times a day (TID) | ORAL | 0 refills | Status: DC | PRN
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
## 2016-05-30 ENCOUNTER — Ambulatory Visit: Payer: Self-pay | Admitting: Physician Assistant

## 2016-05-30 VITALS — BP 125/74 | HR 77 | Temp 98.6°F

## 2016-05-30 DIAGNOSIS — J301 Allergic rhinitis due to pollen: Secondary | ICD-10-CM

## 2016-05-30 MED ORDER — FEXOFENADINE-PSEUDOEPHED ER 180-240 MG PO TB24: 1.0000 | ORAL_TABLET | Freq: Every day | ORAL | 12 refills | Status: DC

## 2016-05-30 MED ORDER — MONTELUKAST SODIUM 10 MG PO TABS: 10.0000 mg | ORAL_TABLET | Freq: Every day | ORAL | 3 refills | Status: DC

## 2016-05-30 NOTE — Progress Notes (Signed)
S: c/o runny nose, congestion, watery eyes, some sinus pressure, scratchy sore throat, sx increased last few days, had windows open in her house, denies fever/chills/body aches, cough, cp/sob, or v/d, didn't get flu shot yesterday due to concerns of being sick  O: vitals wnl, nad, perrl eomi, conjunctiva wnl, tms dull, nasal mucosa swollen and boggy, throat wnl, neck supple no lymph, lungs c t a, cv rrr  A: acute seasonal allergies  P: saline nasal rinse, allegra d 24 h as zyrtec not helping, singulair 10mg  qd

## 2016-06-26 ENCOUNTER — Encounter: Payer: Self-pay | Admitting: *Deleted

## 2016-07-16 ENCOUNTER — Other Ambulatory Visit: Payer: Self-pay | Admitting: Physician Assistant

## 2016-07-16 NOTE — Telephone Encounter (Signed)
Med refill approved 

## 2016-08-10 ENCOUNTER — Other Ambulatory Visit: Payer: Self-pay | Admitting: Physician Assistant

## 2016-08-10 DIAGNOSIS — K219 Gastro-esophageal reflux disease without esophagitis: Secondary | ICD-10-CM

## 2016-08-12 NOTE — Telephone Encounter (Signed)
Med refill for omeprazole with 90 day supply and 3 refills approved

## 2016-08-18 ENCOUNTER — Other Ambulatory Visit: Payer: Self-pay | Admitting: Obstetrics & Gynecology

## 2016-08-18 DIAGNOSIS — Z1231 Encounter for screening mammogram for malignant neoplasm of breast: Secondary | ICD-10-CM

## 2016-08-19 ENCOUNTER — Ambulatory Visit: Payer: Self-pay | Admitting: Physician Assistant

## 2016-08-19 ENCOUNTER — Encounter: Payer: Self-pay | Admitting: Physician Assistant

## 2016-08-19 VITALS — BP 111/70 | HR 73 | Temp 98.3°F

## 2016-08-19 DIAGNOSIS — M25512 Pain in left shoulder: Secondary | ICD-10-CM

## 2016-08-19 NOTE — Progress Notes (Signed)
S: c/o left shoulder pain, similar sx last year and went through physical therapy and got a little better, now shoulder is hurting again, has decreased rom, no numbness or tingling, using ibuprofen and moisture heat pack without relief, states all of her joints are achey  O: vitals wnl, nad, lungs c t a, cv rrr, left shoulder neg for bony tenderness, + spasm in left trapezious and supraspinatus, decreased rom with abduction, hawk's sign, and internal rotation, grips = b/l, n/v intact  A: acute/chronic joint pain left shoulder  P: continue ibuprofen, labs for RA, Sed rate, and lymes

## 2016-08-20 LAB — RHEUMATOID FACTOR: RHEUMATOID FACTOR: 12.1 [IU]/mL (ref 0.0–13.9)

## 2016-08-20 LAB — SEDIMENTATION RATE: Sed Rate: 2 mm/hr (ref 0–32)

## 2016-08-20 LAB — B. BURGDORFI ANTIBODIES

## 2016-08-20 NOTE — Progress Notes (Signed)
Patient was referred to see Horris LatinoLance McGhee, PA [facility].  Patient's appointment is 08/25/16 @ 8:45 am.  I left a message on the patient's voicemail.

## 2016-09-01 ENCOUNTER — Ambulatory Visit (INDEPENDENT_AMBULATORY_CARE_PROVIDER_SITE_OTHER): Payer: Managed Care, Other (non HMO) | Admitting: Podiatry

## 2016-09-01 DIAGNOSIS — M7751 Other enthesopathy of right foot: Secondary | ICD-10-CM | POA: Diagnosis not present

## 2016-09-01 DIAGNOSIS — M779 Enthesopathy, unspecified: Principal | ICD-10-CM

## 2016-09-01 DIAGNOSIS — M778 Other enthesopathies, not elsewhere classified: Secondary | ICD-10-CM
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
## 2016-09-04 ENCOUNTER — Telehealth: Payer: Self-pay | Admitting: *Deleted

## 2016-09-04 ENCOUNTER — Ambulatory Visit (INDEPENDENT_AMBULATORY_CARE_PROVIDER_SITE_OTHER): Payer: Managed Care, Other (non HMO)

## 2016-09-04 ENCOUNTER — Ambulatory Visit (INDEPENDENT_AMBULATORY_CARE_PROVIDER_SITE_OTHER): Payer: Managed Care, Other (non HMO) | Admitting: Podiatry

## 2016-09-04 DIAGNOSIS — M7751 Other enthesopathy of right foot: Secondary | ICD-10-CM | POA: Diagnosis not present

## 2016-09-04 DIAGNOSIS — M722 Plantar fascial fibromatosis: Secondary | ICD-10-CM

## 2016-09-04 DIAGNOSIS — M778 Other enthesopathies, not elsewhere classified: Secondary | ICD-10-CM

## 2016-09-04 DIAGNOSIS — M19171 Post-traumatic osteoarthritis, right ankle and foot: Secondary | ICD-10-CM

## 2016-09-04 DIAGNOSIS — M779 Enthesopathy, unspecified: Secondary | ICD-10-CM

## 2016-09-04 NOTE — Telephone Encounter (Addendum)
-----   Message from Vivi BarrackMatthew R Wagoner, DPM sent at 09/04/2016  9:13 AM EST ----- Can you please order an MRI of the right ankle to rule out lateral ligament tear and to look at the STJ. This is for surgical planning for possible STJ fusion. [facility] and gave orders to [place] [place] for [employer].

## 2016-09-08 DIAGNOSIS — M19171 Post-traumatic osteoarthritis, right ankle and foot: Secondary | ICD-10-CM | POA: Insufficient documentation
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
## 2016-09-09 ENCOUNTER — Telehealth: Payer: Self-pay | Admitting: *Deleted

## 2016-09-09 NOTE — Telephone Encounter (Signed)
"  Patient is scheduled for a MRI without contrast on right ankle on tomorrow.  It needs authorization from [employer]."  I am returning your call with an authorization number for her MRI.  "Go ahead with that number."  It is [id number] and it's valid from 09/09/2016 to 12/08/16.

## 2016-09-10 ENCOUNTER — Ambulatory Visit: Payer: Managed Care, Other (non HMO)

## 2016-09-11 ENCOUNTER — Telehealth: Payer: Self-pay | Admitting: *Deleted

## 2016-09-11 NOTE — Telephone Encounter (Signed)
"  I'm calling to see if my MRI has been authorized."  Yes, it has been authorized it was authorized on 09/09/2016 to 12/08/2016 and the authorization number is [id number].  "Okay, thank you."

## 2016-09-15 ENCOUNTER — Ambulatory Visit (INDEPENDENT_AMBULATORY_CARE_PROVIDER_SITE_OTHER): Payer: Managed Care, Other (non HMO) | Admitting: Family Medicine

## 2016-09-15 ENCOUNTER — Encounter: Payer: Self-pay | Admitting: Family Medicine

## 2016-09-15 VITALS — BP 108/73 | HR 91 | Resp 18 | Ht 63.0 in | Wt 139.0 lb

## 2016-09-15 DIAGNOSIS — Z113 Encounter for screening for infections with a predominantly sexual mode of transmission: Secondary | ICD-10-CM

## 2016-09-15 DIAGNOSIS — R300 Dysuria: Secondary | ICD-10-CM

## 2016-09-15 LAB — POCT URINALYSIS DIPSTICK
Bilirubin, UA: NEGATIVE
Glucose, UA: NEGATIVE
KETONES UA: NEGATIVE
Nitrite, UA: NEGATIVE
PH UA: 6.5
Protein, UA: NEGATIVE
Spec Grav, UA: 1.025
Urobilinogen, UA: 0.2

## 2016-09-15 MED ORDER — SULFAMETHOXAZOLE-TRIMETHOPRIM 800-160 MG PO TABS: 1.0000 | ORAL_TABLET | Freq: Two times a day (BID) | ORAL | 0 refills | Status: DC

## 2016-09-15 NOTE — Patient Instructions (Signed)
Dysuria Introduction Dysuria is pain or discomfort while urinating. The pain or discomfort may be felt in the tube that carries urine out of the bladder (urethra) or in the surrounding tissue of the genitals. The pain may also be felt in the groin area, lower abdomen, and lower back. You may have to urinate frequently or have the sudden feeling that you have to urinate (urgency). Dysuria can affect both men and women, but is more common in women. Dysuria can be caused by many different things, including:  Urinary tract infection in women.  Infection of the kidney or bladder.  Kidney stones or bladder stones.  Certain sexually transmitted infections (STIs), such as chlamydia.  Dehydration.  Inflammation of the vagina.  Use of certain medicines.  Use of certain soaps or scented products that cause irritation. Follow these instructions at home: Watch your dysuria for any changes. The following actions may help to reduce any discomfort you are feeling:  Drink enough fluid to keep your urine clear or pale yellow.  Empty your bladder often. Avoid holding urine for long periods of time.  After a bowel movement or urination, women should cleanse from front to back, using each tissue only once.  Empty your bladder after sexual intercourse.  Take medicines only as directed by your health care provider.  If you were prescribed an antibiotic medicine, finish it all even if you start to feel better.  Avoid caffeine, tea, and alcohol. They can irritate the bladder and make dysuria worse. In men, alcohol may irritate the prostate.  Keep all follow-up visits as directed by your health care provider. This is important.  If you had any tests done to find the cause of dysuria, it is your responsibility to obtain your test results. Ask the lab or department performing the test when and how you will get your results. Talk with your health care provider if you have any questions about your  results. Contact a health care provider if:  You develop pain in your back or sides.  You have a fever.  You have nausea or vomiting.  You have blood in your urine.  You are not urinating as often as you usually do. Get help right away if:  You pain is severe and not relieved with medicines.  You are unable to hold down any fluids.  You or someone else notices a change in your mental function.  You have a rapid heartbeat at rest.  You have shaking or chills.  You feel extremely weak. This information is not intended to replace advice given to you by your health care provider. Make sure you discuss any questions you have with your health care provider. Document Released: 04/25/2004 Document Revised: 01/03/2016 Document Reviewed: 03/23/2014  2017 Elsevier  

## 2016-09-15 NOTE — Progress Notes (Signed)
Pt is currently using the Nuva Ring for contraception, states last month she changed it out a few days late and has been experiencing cramping ever since then.  Also reports some occasional burning with urination.

## 2016-09-15 NOTE — Progress Notes (Signed)
    Subjective:    Patient ID: [name] [name] is a 46 y.o. female presenting with Pelvic Pain  on 09/15/2016  HPI: On Nuva Ring. She took it out a few days late last month and has had cramping ever since. Left in x 4 weeks, then removed and re-inserted it and no cycle since then. Notes some burning with urination. Has had urgency, hesitancy and frequency.  Review of Systems  Constitutional: Positive for fever. Negative for chills.  Respiratory: Negative for shortness of breath.   Cardiovascular: Negative for chest pain.  Gastrointestinal: Negative for abdominal pain, nausea and vomiting.  Genitourinary: Positive for dysuria, pelvic pain and urgency. Negative for menstrual problem, vaginal bleeding and vaginal discharge.  Skin: Negative for rash.      Objective:    BP 108/73 (BP Location: Left Arm, Patient Position: Sitting, Cuff Size: Normal)   Pulse 91   Resp 18   Ht 5\' 3"  (1.6 m)   Wt 139 lb (63 kg)   BMI 24.62 kg/m  Physical Exam  Constitutional: She is oriented to person, place, and time. She appears well-developed and well-nourished. No distress.  HENT:  Head: Normocephalic and atraumatic.  Eyes: No scleral icterus.  Neck: Neck supple.  Cardiovascular: Normal rate.   Pulmonary/Chest: Effort normal.  Abdominal: Soft.  Neurological: She is alert and oriented to person, place, and time.  Skin: Skin is warm and dry.  Psychiatric: She has a normal mood and affect.        Assessment & Plan:  Burning with urination - Likely UTI, given u/a and symptoms. Presumptively give 5 day course of Septra. Check culture and GC/Chlam. - Plan: POCT Urinalysis Dipstick, Urine Culture, sulfamethoxazole-trimethoprim (BACTRIM DS,SEPTRA DS) 800-160 MG tablet, Urine cytology ancillary only   Total face-to-face time with patient: 15 minutes. Over 50% of encounter was spent on counseling and coordination of care. Return if symptoms worsen or fail to improve.  Reva Boresanya S Pratt 09/15/2016 2:40  PM

## 2016-09-17 ENCOUNTER — Telehealth: Payer: Self-pay | Admitting: *Deleted

## 2016-09-17 LAB — URINE CULTURE

## 2016-09-17 NOTE — Telephone Encounter (Signed)
Informed pt of normal urine cx, states she is feeling a little better, is taking the antibiotics.  Instructed to continue taking the antibiotic until it is complete.  Pt acknowledged instructions.

## 2016-09-17 NOTE — Telephone Encounter (Signed)
-----   Message from Reva Boresanya S Pratt, MD sent at 09/17/2016  8:16 AM EST ----- Her urine culture is negative, see if she is feeling better?

## 2016-09-18 ENCOUNTER — Ambulatory Visit
Admission: RE | Admit: 2016-09-18 | Discharge: 2016-09-18 | Disposition: A | Discharge status: Home / Self Care | Payer: Managed Care, Other (non HMO) | Source: Ambulatory Visit | Attending: Obstetrics & Gynecology | Admitting: Obstetrics & Gynecology

## 2016-09-18 ENCOUNTER — Ambulatory Visit
Admission: RE | Admit: 2016-09-18 | Discharge: 2016-09-18 | Disposition: A | Discharge status: Home / Self Care | Payer: Managed Care, Other (non HMO) | Source: Ambulatory Visit | Attending: Podiatry | Admitting: Podiatry

## 2016-09-18 ENCOUNTER — Other Ambulatory Visit (INDEPENDENT_AMBULATORY_CARE_PROVIDER_SITE_OTHER): Payer: Managed Care, Other (non HMO) | Admitting: *Deleted

## 2016-09-18 DIAGNOSIS — M7751 Other enthesopathy of right foot: Secondary | ICD-10-CM | POA: Diagnosis not present

## 2016-09-18 DIAGNOSIS — Z1231 Encounter for screening mammogram for malignant neoplasm of breast: Secondary | ICD-10-CM | POA: Diagnosis not present

## 2016-09-18 DIAGNOSIS — R300 Dysuria: Secondary | ICD-10-CM

## 2016-09-18 DIAGNOSIS — M722 Plantar fascial fibromatosis: Secondary | ICD-10-CM | POA: Diagnosis not present

## 2016-09-18 DIAGNOSIS — Z113 Encounter for screening for infections with a predominantly sexual mode of transmission: Secondary | ICD-10-CM | POA: Diagnosis not present

## 2016-09-18 DIAGNOSIS — M19071 Primary osteoarthritis, right ankle and foot: Secondary | ICD-10-CM | POA: Diagnosis not present

## 2016-09-18 NOTE — Progress Notes (Signed)
Pt seen on Monday for pain with urination, Dr Shawnie PonsPratt ordered a urine GC/CT screen and urine had been disposed of before collection.  Here to give sample to send for testing.

## 2016-09-18 NOTE — Addendum Note (Signed)
Addended by: Gita KudoLASSITER, KRISTEN S on: 09/18/2016 01:42 PM   Modules accepted: Orders

## 2016-09-19 LAB — URINE CYTOLOGY ANCILLARY ONLY
CHLAMYDIA, DNA PROBE: NEGATIVE
NEISSERIA GONORRHEA: NEGATIVE

## 2016-09-23 ENCOUNTER — Telehealth: Payer: Self-pay | Admitting: Emergency Medicine

## 2016-09-23 NOTE — Telephone Encounter (Signed)
Patient called and informed us that her GYN physician recently prescribed her an antibiotic.  She has developed a yeast infection.  She attempted to call her physician, but was unsuccessful.  She wants to know if we can call in a Diflucan.  I informed Darl PikesSusan and she authorized me to call in [facility] 150mg  [facility] [address]

## 2016-09-24 ENCOUNTER — Telehealth: Payer: Self-pay | Admitting: *Deleted

## 2016-09-24 DIAGNOSIS — B373 Candidiasis of vulva and vagina: Secondary | ICD-10-CM

## 2016-09-24 DIAGNOSIS — B3731 Acute candidiasis of vulva and vagina: Secondary | ICD-10-CM

## 2016-09-24 MED ORDER — FLUCONAZOLE 150 MG PO TABS: 150.0000 mg | ORAL_TABLET | Freq: Once | ORAL | 0 refills | Status: AC

## 2016-09-24 NOTE — Telephone Encounter (Signed)
-----   Message from Lindell SparHeather L Bacon, [place] sent at 09/23/2016  4:01 PM EST ----- Regarding: Diflucan Rx request Contact: [phone] Can you please call in a [facility] script to [facility] [place]   Thanks

## 2016-09-24 NOTE — Telephone Encounter (Signed)
Pt just completed a dose of antibiotics for a UTI, now c/o yeast infection symptoms, Diflucan sent to pharmacy.

## 2016-09-25 ENCOUNTER — Ambulatory Visit: Payer: Managed Care, Other (non HMO) | Admitting: Podiatry

## 2016-09-25 ENCOUNTER — Encounter: Payer: Self-pay | Admitting: Podiatry

## 2016-09-25 ENCOUNTER — Ambulatory Visit (INDEPENDENT_AMBULATORY_CARE_PROVIDER_SITE_OTHER): Payer: Managed Care, Other (non HMO) | Admitting: Podiatry

## 2016-09-25 DIAGNOSIS — M19071 Primary osteoarthritis, right ankle and foot: Secondary | ICD-10-CM | POA: Diagnosis not present

## 2016-09-25 DIAGNOSIS — M898X9 Other specified disorders of bone, unspecified site: Secondary | ICD-10-CM

## 2016-09-25 NOTE — Patient Instructions (Signed)

## 2016-09-29 NOTE — Progress Notes (Signed)
Subjective: 46 year old female presents the office today for surgical consultation and to discuss MRI results. She's had pain to her "ankle" for several years. She's attempted injections, shoe changes, answers, bracing with any significant improvement as time she is looking for possible surgical intervention to help decrease her pain on a more permanent basis. She has been seen Dr. Al CorpusHyatt for some time now. She is requesting Dr. Al CorpusHyatt if we proceed with surgery is present during the surgery.  Denies any systemic complaints such as fevers, chills, nausea, vomiting. No acute changes since last appointment, and no other complaints at this time.   Objective: AAO x3, NAD DP/PT pulses palpable bilaterally, CRT less than 3 seconds There is tenderness palpation along the sinus tarsi of the right foot. There is a decreased range of motion of subtalar joint and there is tenderness on the sinus tarsi. There is also mild tenderness to palpation the plantar aspect of the calcaneus. There is also discomfort on the lateral aspect of the sinus tarsi. There is localized edema to the lateral aspect of the foot on the sinus tarsi. There is minimal discomfort along the ATFL/CFL. There is no gross ankle facility present. Anterior drawer and talar tilt test is negative compared to the contralateral extremity. MMT 5/5. Range of motion intact otherwise. There are no open lesions or pre-ulcerative lesions identified today. There is no pain with calf compression, swelling, warmth, erythema.  Assessment: Right subtalar joint osteoarthritis   Plan: -Treatment options discussed including all alternatives, risks, and complications -Etiology of symptoms were discussed -MRI results were discussed the patient. See below.  -At this time a discussed with her surgical intervention as well as conservative treatment. She wished to proceed with surgical treatment. I discussed the subtalar joint arthrodesis as well as exostectomy the  plantar calcaneal spur. I discussed with her the surgery as well as the postoperative course. -The incision placement as well as the postoperative course was discussed with the patient. I discussed risks of the surgery which include, but not limited to, infection, bleeding, pain, swelling, need for further surgery, delayed or nonhealing, painful or ugly scar, numbness or sensation changes, over/under correction, recurrence, transfer lesions, further deformity, hardware failure, DVT/PE, loss of toe/foot. Patient understands these risks and wishes to proceed with surgery. The surgical consent was reviewed with the patient all 3 pages were signed. No promises or guarantees were given to the outcome of the procedure. All questions were answered to the best of my ability. Before the surgery the patient was encouraged to call the office if there is any further questions. The surgery will be performed at [facility] on an outpatient basis. -She would like for me to do the surgery and Dr. Al CorpusHyatt to be present. I will be [facility] however and she is aware of this. Discussed with her that she can follow[place] with me in [place] but she wishes to see Dr. Al CorpusHyatt postop. She is aware that I will most likely not be seeing her after the surgery. However, I am always available for any questions or issues. She wishes to proceed.   MRI: 09/18/16 IMPRESSION: 1. 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 2. No acute injury of the right ankle.  Ovid CurdMatthew Yanely Mast, DPM

## 2016-10-02 ENCOUNTER — Telehealth: Payer: Self-pay | Admitting: *Deleted

## 2016-10-02 NOTE — Telephone Encounter (Signed)
"  Hey my name is [name] [name].  I can be reached at my home number."

## 2016-10-03 NOTE — Telephone Encounter (Signed)
I attempted to return patient's call.  I left her a message to call me back on Monday.

## 2016-10-06 ENCOUNTER — Telehealth: Payer: Self-pay | Admitting: *Deleted

## 2016-10-06 NOTE — Telephone Encounter (Signed)
"  Hello Ms. [name] [name], this is Pathmark StoresJessie Mapp.  I just received a call from you so I'm just calling back."

## 2016-10-06 NOTE — Telephone Encounter (Signed)
"  I missed your call on Friday.  I need to schedule my surgery with Dr. Ardelle AntonWagoner.  Dr. Al CorpusHyatt is supposed to assist.  Dr. Ardelle AntonWagoner said it would be done on a Friday.  I want to do it the end of April."  He can do it on April 27.  Someone from the surgical center will call you with an arrival time a day or two prior to surgery date.  You can go ahead an register with the surgical center.

## 2016-10-08 NOTE — Telephone Encounter (Signed)
I spoke to patient on Monday.  Surgery was scheduled for 12/06/2015.

## 2016-10-28 DIAGNOSIS — M79673 Pain in unspecified foot: Secondary | ICD-10-CM

## 2016-11-06 ENCOUNTER — Encounter: Payer: Self-pay | Admitting: Physician Assistant

## 2016-11-06 ENCOUNTER — Ambulatory Visit: Payer: Self-pay | Admitting: Physician Assistant

## 2016-11-06 VITALS — BP 120/60 | HR 72 | Temp 98.5°F

## 2016-11-06 DIAGNOSIS — H6981 Other specified disorders of Eustachian tube, right ear: Secondary | ICD-10-CM

## 2016-11-06 MED ORDER — PREDNISONE 10 MG PO TABS: 30.0000 mg | ORAL_TABLET | Freq: Every day | ORAL | 0 refills | Status: DC

## 2016-11-06 NOTE — Progress Notes (Signed)
S:  C/o ears popping and being stopped up, no drainage from ears, no fever/chills, no cough or congestion, some sinus pressure, remainder ros neg Using otc meds without relief  O:  Vitals wnl, nad, tms dull b/l, nasal mucosa swollen, throat wnl, neck supple no lymph, lungs c t a, cv rrr, neuro intact  A: acute eustachean tube dysfunction  P: flonase, prednisone 30mg  qd x 3d, return if not improving in 3 to 5 days, return earlier if worsening, add claritin or allegra

## 2016-11-25 ENCOUNTER — Encounter: Payer: Self-pay | Admitting: Physician Assistant

## 2016-11-25 ENCOUNTER — Ambulatory Visit: Payer: Self-pay | Admitting: Physician Assistant

## 2016-11-25 VITALS — BP 110/70 | HR 95 | Temp 98.8°F

## 2016-11-25 DIAGNOSIS — R1013 Epigastric pain: Secondary | ICD-10-CM

## 2016-11-25 MED ORDER — ONDANSETRON HCL 4 MG PO TABS: 4.0000 mg | ORAL_TABLET | Freq: Three times a day (TID) | ORAL | 0 refills | Status: DC | PRN

## 2016-11-25 NOTE — Progress Notes (Signed)
S: c/o epigastric pain and nausea, no v/d, no fever/chills, had same sx last Monday and they went away, now has sx for 2 days, no cp/sob, is stressed and worried about her upcoming schedule, does not have appendix or gallbladder, does have hx of ulcers,   O: vitals wnl, nad, lungs c t a, cv rrr, abd tender in all 4 quads, no rebound tenderness, bs normal all 4 quads, n/v intact  A: epigastric pain  P: increase omeprazole to  qd x 1 week, zofran for nausea, return if worsening, return if not better in 3-5 days

## 2016-11-27 ENCOUNTER — Telehealth: Payer: Self-pay | Admitting: *Deleted

## 2016-11-27 NOTE — Telephone Encounter (Signed)
"  Ms. [name] [name] could you please give me a call back.  Thank you."  I'm returning your call.  How can I help you.  "I received an email that my surgery would be at 5:45 and it told me to be there a hour early.  Is that the case?"  The arrival time does not sound right to me.  You may want to call the surgical center tomorrow and ask them.  "Can I pick up my prescriptions the day before because last time I had surgery and got the prescription the day of the pharmacy didn't have the medication and had to order it.  My pain medication I had taken wore off.  I don't want to experience that pain again."  I will have to ask Dr. Al Corpus.  Normally they do not give prescriptions in advance but I will ask him.  If he okays it I will let you know.

## 2016-12-01 ENCOUNTER — Other Ambulatory Visit: Payer: Self-pay

## 2016-12-01 DIAGNOSIS — E559 Vitamin D deficiency, unspecified: Secondary | ICD-10-CM

## 2016-12-01 NOTE — Telephone Encounter (Signed)
This is Dr. Gabriel Rung surgery.  I am assisting.  He will be following her care and writing prescriptions. Make sure that her follow up is with Dr. Ardelle Anton.

## 2016-12-01 NOTE — Telephone Encounter (Signed)
If she would like to come and get the prescriptions on Thursday, that will be fine.

## 2016-12-01 NOTE — Telephone Encounter (Signed)
I have moved her post op appointments to Dr. Gabriel Rung schedule.

## 2016-12-01 NOTE — Progress Notes (Signed)
Pt has hx of vit d def, would like to have her level checked, was taking otc vit d, is still tired, pt seen by RMA and she drew blood for lab tests

## 2016-12-02 ENCOUNTER — Other Ambulatory Visit: Payer: Self-pay | Admitting: Physician Assistant

## 2016-12-02 LAB — SPECIMEN STATUS

## 2016-12-02 LAB — VITAMIN D 25 HYDROXY (VIT D DEFICIENCY, FRACTURES): Vit D, 25-Hydroxy: 16.9 ng/mL — ABNORMAL LOW (ref 30.0–100.0)

## 2016-12-02 MED ORDER — VITAMIN D (ERGOCALCIFEROL) 1.25 MG (50000 UNIT) PO CAPS: 50000.0000 [IU] | ORAL_CAPSULE | ORAL | 3 refills | Status: DC

## 2016-12-02 NOTE — Telephone Encounter (Signed)
I will go ahead and cancel patient's post-op appointments.

## 2016-12-02 NOTE — Telephone Encounter (Signed)
I told you incorrectly.  Dr. Ardelle Anton said your vitamin D deficiency can cause problems with healing.  He said it could possibly cause another surgery to be performed if it doesn't heal properly.  He stated your level had dropped from 43 to 16 within the past 9 months.  So, he recommends that you postpone surgery, which is scheduled for Friday, for now until you levels can increase.  "What do I do about my FMLA?"  Call human resources and let them know.

## 2016-12-02 NOTE — Telephone Encounter (Signed)
"  I went to the doctor yesterday and they said my vitamin D was low.  Will that have any affect on my surgery?"  No, it will not have effect your surgery.

## 2016-12-02 NOTE — Telephone Encounter (Signed)
I am calling to let you know that Dr. Ardelle Anton said you can get your prescriptions in advance.  I will send the prescriptions to the [facility] office by Morrie Sheldon, Dr. Geryl Rankins assistant.  "Okay, thank you so much."

## 2016-12-02 NOTE — Progress Notes (Signed)
Vit d deficient per labs, [facility] [address]

## 2016-12-10 ENCOUNTER — Encounter: Payer: Managed Care, Other (non HMO) | Admitting: Podiatry

## 2016-12-11 ENCOUNTER — Encounter: Payer: Managed Care, Other (non HMO) | Admitting: Podiatry

## 2016-12-16 ENCOUNTER — Other Ambulatory Visit: Payer: Self-pay

## 2016-12-16 DIAGNOSIS — Z299 Encounter for prophylactic measures, unspecified: Secondary | ICD-10-CM

## 2016-12-16 NOTE — Progress Notes (Signed)
Patient came in to have blood drawn for testing per Susan's authorization.  Patient wants results sent to Dr. Loreta AveWagner at [facility].

## 2016-12-17 ENCOUNTER — Telehealth: Payer: Self-pay | Admitting: *Deleted

## 2016-12-17 ENCOUNTER — Encounter: Payer: Managed Care, Other (non HMO) | Admitting: Podiatry

## 2016-12-17 LAB — VITAMIN D 25 HYDROXY (VIT D DEFICIENCY, FRACTURES): VIT D 25 HYDROXY: 34.5 ng/mL (ref 30.0–100.0)

## 2016-12-17 NOTE — Telephone Encounter (Signed)
"  Mrs. [name] [name] could you please give me a call back when you get a chance?"

## 2016-12-18 ENCOUNTER — Encounter: Payer: Managed Care, Other (non HMO) | Admitting: Podiatry

## 2016-12-18 NOTE — Telephone Encounter (Signed)
"  My vitamin D came up to 34.5.  I still don't think I'm ready to have the surgery."  He probably won't either.  I will let him know.  If he says anything different I will let you know.

## 2016-12-19 NOTE — Telephone Encounter (Signed)
Is she continuing on vitamin D supplements?

## 2016-12-22 NOTE — Telephone Encounter (Signed)
Dr. Ardelle AntonWagoner wants to know if you are still taking the Vitamin D.  "I have one left but I think I may need to take more."  You will have to talk to your doctor about continuing the Vitamin D.  I'll let Dr. Ardelle AntonWagoner know.

## 2016-12-26 ENCOUNTER — Ambulatory Visit: Payer: Self-pay | Admitting: Physician Assistant

## 2017-01-07 NOTE — Telephone Encounter (Signed)
We can recheck it this week or next to see where it is at. If it stays normal, I think we are OK to proceed.

## 2017-01-08 NOTE — Telephone Encounter (Signed)
I called and left patient a message that Dr. Ardelle AntonWagoner said to have her level checked again this week or next week.  If level stays normal, you can proceed.  I asked her to give me a call with any other questions.

## 2017-01-16 ENCOUNTER — Other Ambulatory Visit: Payer: Self-pay | Admitting: Physician Assistant

## 2017-01-16 VITALS — BP 100/70 | HR 100 | Temp 98.5°F | Resp 16

## 2017-01-16 DIAGNOSIS — Z299 Encounter for prophylactic measures, unspecified: Secondary | ICD-10-CM

## 2017-01-16 MED ORDER — NYSTATIN 100000 UNIT/GM EX OINT: 1.0000 "application " | TOPICAL_OINTMENT | Freq: Every day | CUTANEOUS | 0 refills | Status: DC

## 2017-01-16 NOTE — Progress Notes (Signed)
   Subjective:Rash    Patient ID: Patton SallesJessie S Mapp, female    DOB: 1970-09-05, 46 y.o.   MRN: [id number]  HPI Patient c/o rash under both breast for one day. States mild itching. No drainage or pain.   Review of Systems Negative except for compliant.    Objective:   Physical Exam  Exam revealed macular erythematous lesion under both breast. No s/s of 2nd infection.      Assessment & Plan:Tinea infection under breast.  Nystatin powder and cream as directed. Follow up PCP.

## 2017-01-16 NOTE — Addendum Note (Signed)
Addended by: Catha BrowEACON, MONIQUE T on: 01/16/2017 10:11 AM   Modules accepted: Orders

## 2017-01-17 LAB — VITAMIN D 25 HYDROXY (VIT D DEFICIENCY, FRACTURES): Vit D, 25-Hydroxy: 70.4 ng/mL (ref 30.0–100.0)

## 2017-01-26 ENCOUNTER — Other Ambulatory Visit (INDEPENDENT_AMBULATORY_CARE_PROVIDER_SITE_OTHER): Payer: Managed Care, Other (non HMO) | Admitting: *Deleted

## 2017-01-26 DIAGNOSIS — R300 Dysuria: Secondary | ICD-10-CM

## 2017-01-26 LAB — POCT URINALYSIS DIPSTICK
BILIRUBIN UA: NEGATIVE
Glucose, UA: NEGATIVE
Nitrite, UA: NEGATIVE
PROTEIN UA: 1
Spec Grav, UA: >=1.03 — AB (ref 1.010–1.025)
Urobilinogen, UA: 0.2 E.U./dL
pH, UA: 6.5 (ref 5.0–8.0)

## 2017-01-26 MED ORDER — SULFAMETHOXAZOLE-TRIMETHOPRIM 800-160 MG PO TABS: 1.0000 | ORAL_TABLET | Freq: Two times a day (BID) | ORAL | 1 refills | Status: DC

## 2017-01-26 NOTE — Progress Notes (Signed)
SUBJECTIVE: [name] [name] is a 46 y.o. female who complains of urinary frequency, urgency and dysuria, without flank pain, fever, chills, or abnormal vaginal discharge or bleeding.   OBJECTIVE: Appears well, in no apparent distress.  Vital signs are normal. Urine dipstick shows positive for WBC's, positive for RBC's, positive for protein and positive for ketones.    ASSESSMENT: Dysuria  PLAN:  Urine Culture sent. Treatment per orders.   Call or return to clinic prn if these symptoms worsen or fail to improve as anticipated.

## 2017-01-29 LAB — CULTURE, URINE COMPREHENSIVE

## 2017-02-03 ENCOUNTER — Other Ambulatory Visit: Payer: Managed Care, Other (non HMO) | Admitting: *Deleted

## 2017-02-03 DIAGNOSIS — N390 Urinary tract infection, site not specified: Principal | ICD-10-CM

## 2017-02-03 DIAGNOSIS — B9620 Unspecified Escherichia coli [E. coli] as the cause of diseases classified elsewhere: Secondary | ICD-10-CM

## 2017-02-03 NOTE — Progress Notes (Signed)
Pt was treated for a UTI on 01-26-17 with Bactrim DS which was sensitive on urine cx.  Pt here today to repeat urine culture.  States she still has a slight sensation that the UTI is still present.  Pt just completed the antibiotic treatment yesterday.  Informed pt that she may not see full improvement until a few days after completing the antibiotic treatment.  Pt acknowledged and we will inform her of urine cx when available and treat as necessary.

## 2017-02-05 LAB — URINE CULTURE: ORGANISM ID, BACTERIA: NO GROWTH

## 2017-02-24 ENCOUNTER — Encounter: Payer: Self-pay | Admitting: Obstetrics & Gynecology

## 2017-02-24 ENCOUNTER — Ambulatory Visit (INDEPENDENT_AMBULATORY_CARE_PROVIDER_SITE_OTHER): Payer: Managed Care, Other (non HMO) | Admitting: Obstetrics & Gynecology

## 2017-02-24 VITALS — BP 127/84 | HR 89 | Resp 18 | Ht 63.0 in | Wt 142.0 lb

## 2017-02-24 DIAGNOSIS — Z124 Encounter for screening for malignant neoplasm of cervix: Secondary | ICD-10-CM | POA: Diagnosis not present

## 2017-02-24 DIAGNOSIS — R350 Frequency of micturition: Secondary | ICD-10-CM | POA: Diagnosis not present

## 2017-02-24 DIAGNOSIS — Z01419 Encounter for gynecological examination (general) (routine) without abnormal findings: Secondary | ICD-10-CM

## 2017-02-24 DIAGNOSIS — N803 Endometriosis of pelvic peritoneum: Secondary | ICD-10-CM | POA: Diagnosis not present

## 2017-02-24 DIAGNOSIS — R5383 Other fatigue: Secondary | ICD-10-CM | POA: Diagnosis not present

## 2017-02-24 DIAGNOSIS — Z113 Encounter for screening for infections with a predominantly sexual mode of transmission: Secondary | ICD-10-CM | POA: Diagnosis not present

## 2017-02-24 DIAGNOSIS — Z1151 Encounter for screening for human papillomavirus (HPV): Secondary | ICD-10-CM | POA: Diagnosis not present

## 2017-02-24 DIAGNOSIS — IMO0002 Reserved for concepts with insufficient information to code with codable children: Secondary | ICD-10-CM

## 2017-02-24 DIAGNOSIS — R21 Rash and other nonspecific skin eruption: Secondary | ICD-10-CM | POA: Diagnosis not present

## 2017-02-24 MED ORDER — TRIAMCINOLONE ACETONIDE 0.1 % EX CREA: 1.0000 "application " | TOPICAL_CREAM | Freq: Two times a day (BID) | CUTANEOUS | 5 refills | Status: DC

## 2017-02-24 MED ORDER — ETONOGESTREL-ETHINYL ESTRADIOL 0.12-0.015 MG/24HR VA RING: VAGINAL_RING | VAGINAL | 5 refills | Status: DC

## 2017-02-24 MED ORDER — CIPROFLOXACIN HCL 500 MG PO TABS: 500.0000 mg | ORAL_TABLET | Freq: Two times a day (BID) | ORAL | 0 refills | Status: DC

## 2017-02-24 MED ORDER — NYSTATIN-TRIAMCINOLONE 100000-0.1 UNIT/GM-% EX OINT: 1.0000 "application " | TOPICAL_OINTMENT | Freq: Two times a day (BID) | CUTANEOUS | 5 refills | Status: DC

## 2017-02-24 NOTE — Progress Notes (Signed)
GYNECOLOGY ANNUAL PREVENTATIVE CARE ENCOUNTER NOTE  Subjective:   [name] [name] is a 46 y.o. G1P1 female here for a routine annual gynecologic exam.  Patient has a history of endometriosis, managed on continuous hormonal therapy (Nuvaring) and she has no concerns. Desires refill of Nuvaring.  Patient also desires checking yearly health maintenance labs labs today as part of her exam.  Also reports increased fatigue lately, wants to ensure her labs are okay.  She also reports increased urinary frequency x 1 week, no dysuria, no fevers, no N/V, no back pain.  Also desires evaluation of chronic rash in between and under breasts; she has been treating it with Diflucan and it improved slightly but it is still there.  Denies abnormal vaginal bleeding, discharge, pelvic pain, problems with intercourse or other gynecologic concerns.    Gynecologic History No LMP recorded. Patient is not currently having periods (Reason: Other). Contraception: NuvaRing vaginal inserts Last Pap: 02/15/2016. Results were: normal cytology with +HRHPV 18/45. Benign colposcopy. Last mammogram: 09/18/2016. Results were: normal  Obstetric History OB History  Gravida Para Term Preterm AB Living  1 1       1   SAB TAB Ectopic Multiple Live Births          1    # Outcome Date GA Lbr Len/2nd Weight Sex Delivery Anes PTL Lv  1 Para 1995    M CS-LTranv   LIV      Past Medical History:  Diagnosis Date  . Allergy   . Arthritis   . Asthma   . Constipation   . Endometriosis    Uses Nuvaring continuously  . Heart murmur   . Ulcer     Past Surgical History:  Procedure Laterality Date  . ANKLE SURGERY    . APPENDECTOMY    . CESAREAN SECTION    . CHOLECYSTECTOMY    . LAPAROSCOPIC ENDOMETRIOSIS FULGURATION    . LAPAROSCOPY     for endometriosis  . WISDOM TOOTH EXTRACTION     x2    Current Outpatient Prescriptions on File Prior to Visit  Medication Sig Dispense Refill  . Albuterol (VENTOLIN IN) Inhale into the  lungs.    . cyclobenzaprine (FLEXERIL) 5 MG tablet Take 1 tablet (5 mg total) by mouth 3 (three) times daily as needed for muscle spasms. 30 tablet 0  . FLOVENT HFA 110 MCG/ACT inhaler INHALE ONE PUFF BY MOUTH TWICE DAILY 1 Inhaler 12  . fluticasone (FLONASE) 50 MCG/ACT nasal spray Place 2 sprays into both nostrils daily. 16 g 11  . ibuprofen (ADVIL,MOTRIN) 800 MG tablet TAKE ONE TABLET BY MOUTH EVERY 8 HOURS AS NEEDED 30 tablet 6  . montelukast (SINGULAIR) 10 MG tablet Take 1 tablet (10 mg total) by mouth at bedtime. 30 tablet 3  . nystatin ointment (MYCOSTATIN) Apply 1 application topically at bedtime. 30 g 0  . omeprazole (PRILOSEC) 20 MG capsule TAKE ONE CAPSULE BY MOUTH ONCE DAILY 90 capsule 3  . Vitamin D, Ergocalciferol, (DRISDOL) 50000 units CAPS capsule Take 1 capsule (50,000 Units total) by mouth every 7 (seven) days. 12 capsule 3   No current facility-administered medications on file prior to visit.     No Known Allergies  Social History   Social History  . Marital status: Single    Spouse name: N/A  . Number of children: N/A  . Years of education: N/A   Occupational History  . Not on file.   Social History Main Topics  . Smoking  status: Never Smoker  . Smokeless tobacco: Never Used  . Alcohol use No  . Drug use: No  . Sexual activity: Yes    Birth control/ protection: Inserts     Comment: Nuva ring   Other Topics Concern  . Not on file   Social History Narrative  . No narrative on file    Family History  Problem Relation Age of Onset  . COPD Mother   . Emphysema Mother   . Cancer Father        unknown  . Cancer Paternal Aunt        ovarian    The following portions of the patient's history were reviewed and updated as appropriate: allergies, current medications, past family history, past medical history, past social history, past surgical history and problem list.  Review of Systems Pertinent items noted in HPI and remainder of comprehensive ROS  otherwise negative.   Objective:  BP 127/84   Pulse 89   Resp 18   Ht 5\' 3"  (1.6 m)   Wt 142 lb (64.4 kg)   BMI 25.15 kg/m  CONSTITUTIONAL: Well-developed, well-nourished female in no acute distress.  HENT:  Normocephalic, atraumatic, External right and left ear normal. Oropharynx is clear and moist EYES: Conjunctivae and EOM are normal. Pupils are equal, round, and reactive to light. No scleral icterus.  NECK: Normal range of motion, supple, no masses.  Normal thyroid.  SKIN: Skin is warm and dry. No rash noted. Not diaphoretic. No erythema. No pallor. NEUROLOGIC: Alert and oriented to person, place, and time. Normal reflexes, muscle tone coordination. No cranial nerve deficit noted. PSYCHIATRIC: Normal mood and affect. Normal behavior. Normal judgment and thought content. CARDIOVASCULAR: Normal heart rate noted, regular rhythm RESPIRATORY: Clear to auscultation bilaterally. Effort and breath sounds normal, no problems with respiration noted. BREASTS: Symmetric in size. Mildly erythematous macular rash noted in between breast and underneath medial parts of both breasts.  Nontender. No other masses, skin changes, nipple drainage, or lymphadenopathy bilaterally. ABDOMEN: Soft, normal bowel sounds, no distention noted.  No tenderness, rebound or guarding.  PELVIC: Normal appearing external genitalia; normal appearing vaginal mucosa and cervix.  No abnormal discharge noted.  Nuvaring in place.  Pap smear obtained.  Normal uterine size, no other palpable masses, no uterine or adnexal tenderness. MUSCULOSKELETAL: Normal range of motion. No tenderness.  No cyanosis, clubbing, or edema.  2+ distal pulses.  Assessment and Plan:  1. Encounter for gynecological examination with Papanicolaou smear of cervix and well-woman examination - CBC - Comprehensive metabolic panel - Hemoglobin A1c - TSH - Lipid panel - Hepatitis B surface antigen - Hepatitis C antibody - HIV antibody - RPR - Cytology -  PAP Will follow up results of pap smear and manage accordingly.  2. Endometriosis of pelvis Nuvaring refilled - etonogestrel-ethinyl estradiol (NUVARING) 0.12-0.015 MG/24HR vaginal ring; Insert vaginally and leave in place for 4 consecutive weeks, then place another one. Use in continuous fashion.  Dispense: 3 each; Refill: 5  3. Increased urinary frequency UA equivocal. Will follow up urine culture results. - Urine Culture - Ciprofloxacin 500 mg po bid x 3 days presumptively prescribed for now  4. Fatigue, unspecified type - CBC - TSH  5. Rash, skin Two options given to her; wanted to add steroid cream to Nystatin - nystatin-triamcinolone ointment (MYCOLOG); Apply 1 application topically 2 (two) times daily.  Dispense: 30 g; Refill: 5 - triamcinolone cream (KENALOG) 0.1 %; Apply 1 application topically 2 (two) times daily.  Dispense:  30 g; Refill: 5  Routine preventative health maintenance measures emphasized. Please refer to After Visit Summary for other counseling recommendations.    Jaynie CollinsUGONNA  Shimshon Narula, MD, FACOG Attending Obstetrician & Gynecologist, [facility]

## 2017-02-24 NOTE — Patient Instructions (Signed)
Preventive Care 40-64 Years, Female Preventive care refers to lifestyle choices and visits with your health care provider that can promote health and wellness. What does preventive care include?  A yearly physical exam. This is also called an annual well check.  Dental exams once or twice a year.  Routine eye exams. Ask your health care provider how often you should have your eyes checked.  Personal lifestyle choices, including: ? Daily care of your teeth and gums. ? Regular physical activity. ? Eating a healthy diet. ? Avoiding tobacco and drug use. ? Limiting alcohol use. ? Practicing safe sex. ? Taking low-dose aspirin daily starting at age 58. ? Taking vitamin and mineral supplements as recommended by your health care provider. What happens during an annual well check? The services and screenings done by your health care provider during your annual well check will depend on your age, overall health, lifestyle risk factors, and family history of disease. Counseling Your health care provider may ask you questions about your:  Alcohol use.  Tobacco use.  Drug use.  Emotional well-being.  Home and relationship well-being.  Sexual activity.  Eating habits.  Work and work Statistician.  Method of birth control.  Menstrual cycle.  Pregnancy history.  Screening You may have the following tests or measurements:  Height, weight, and BMI.  Blood pressure.  Lipid and cholesterol levels. These may be checked every 5 years, or more frequently if you are over 81 years old.  Skin check.  Lung cancer screening. You may have this screening every year starting at age 78 if you have a 30-pack-year history of smoking and currently smoke or have quit within the past 15 years.  Fecal occult blood test (FOBT) of the stool. You may have this test every year starting at age 65.  Flexible sigmoidoscopy or colonoscopy. You may have a sigmoidoscopy every 5 years or a colonoscopy  every 10 years starting at age 30.  Hepatitis C blood test.  Hepatitis B blood test.  Sexually transmitted disease (STD) testing.  Diabetes screening. This is done by checking your blood sugar (glucose) after you have not eaten for a while (fasting). You may have this done every 1-3 years.  Mammogram. This may be done every 1-2 years. Talk to your health care provider about when you should start having regular mammograms. This may depend on whether you have a family history of breast cancer.  BRCA-related cancer screening. This may be done if you have a family history of breast, ovarian, tubal, or peritoneal cancers.  Pelvic exam and Pap test. This may be done every 3 years starting at age 80. Starting at age 36, this may be done every 5 years if you have a Pap test in combination with an HPV test.  Bone density scan. This is done to screen for osteoporosis. You may have this scan if you are at high risk for osteoporosis.  Discuss your test results, treatment options, and if necessary, the need for more tests with your health care provider. Vaccines Your health care provider may recommend certain vaccines, such as:  Influenza vaccine. This is recommended every year.  Tetanus, diphtheria, and acellular pertussis (Tdap, Td) vaccine. You may need a Td booster every 10 years.  Varicella vaccine. You may need this if you have not been vaccinated.  Zoster vaccine. You may need this after age 5.  Measles, mumps, and rubella (MMR) vaccine. You may need at least one dose of MMR if you were born in  1957 or later. You may also need a second dose.  Pneumococcal 13-valent conjugate (PCV13) vaccine. You may need this if you have certain conditions and were not previously vaccinated.  Pneumococcal polysaccharide (PPSV23) vaccine. You may need one or two doses if you smoke cigarettes or if you have certain conditions.  Meningococcal vaccine. You may need this if you have certain  conditions.  Hepatitis A vaccine. You may need this if you have certain conditions or if you travel or work in places where you may be exposed to hepatitis A.  Hepatitis B vaccine. You may need this if you have certain conditions or if you travel or work in places where you may be exposed to hepatitis B.  Haemophilus influenzae type b (Hib) vaccine. You may need this if you have certain conditions.  Talk to your health care provider about which screenings and vaccines you need and how often you need them. This information is not intended to replace advice given to you by your health care provider. Make sure you discuss any questions you have with your health care provider. Document Released: 08/24/2015 Document Revised: 04/16/2016 Document Reviewed: 05/29/2015 Elsevier Interactive Patient Education  2017 [employer].

## 2017-02-25 ENCOUNTER — Encounter: Payer: Self-pay | Admitting: Obstetrics & Gynecology

## 2017-02-25 LAB — COMPREHENSIVE METABOLIC PANEL
ALT: 8 IU/L (ref 0–32)
AST: 10 IU/L (ref 0–40)
Albumin/Globulin Ratio: 1.4 (ref 1.2–2.2)
Albumin: 3.9 g/dL (ref 3.5–5.5)
Alkaline Phosphatase: 73 IU/L (ref 39–117)
BUN/Creatinine Ratio: 22 (ref 9–23)
BUN: 14 mg/dL (ref 6–24)
Bilirubin Total: 0.3 mg/dL (ref 0.0–1.2)
CO2: 21 mmol/L (ref 20–29)
Calcium: 9 mg/dL (ref 8.7–10.2)
Chloride: 106 mmol/L (ref 96–106)
Creatinine, Ser: 0.63 mg/dL (ref 0.57–1.00)
GFR, EST AFRICAN AMERICAN: 124 mL/min/{1.73_m2} (ref >59)
GFR, EST NON AFRICAN AMERICAN: 108 mL/min/{1.73_m2} (ref >59)
GLUCOSE: 82 mg/dL (ref 65–99)
Globulin, Total: 2.8 g/dL (ref 1.5–4.5)
Potassium: 4.1 mmol/L (ref 3.5–5.2)
Sodium: 140 mmol/L (ref 134–144)
TOTAL PROTEIN: 6.7 g/dL (ref 6.0–8.5)

## 2017-02-25 LAB — HIV ANTIBODY (ROUTINE TESTING W REFLEX): HIV SCREEN 4TH GENERATION: NONREACTIVE

## 2017-02-25 LAB — LIPID PANEL
CHOLESTEROL TOTAL: 169 mg/dL (ref 100–199)
Chol/HDL Ratio: 2.5 ratio (ref 0.0–4.4)
HDL: 68 mg/dL (ref >39)
LDL Calculated: 76 mg/dL (ref 0–99)
Triglycerides: 126 mg/dL (ref 0–149)
VLDL CHOLESTEROL CAL: 25 mg/dL (ref 5–40)

## 2017-02-25 LAB — CBC
Hematocrit: 42.5 % (ref 34.0–46.6)
Hemoglobin: 14 g/dL (ref 11.1–15.9)
MCH: 28.5 pg (ref 26.6–33.0)
MCHC: 32.9 g/dL (ref 31.5–35.7)
MCV: 87 fL (ref 79–97)
PLATELETS: 295 10*3/uL (ref 150–379)
RBC: 4.91 x10E6/uL (ref 3.77–5.28)
RDW: 14 % (ref 12.3–15.4)
WBC: 5.9 10*3/uL (ref 3.4–10.8)

## 2017-02-25 LAB — HEMOGLOBIN A1C
Est. average glucose Bld gHb Est-mCnc: 103 mg/dL
Hgb A1c MFr Bld: 5.2 % (ref 4.8–5.6)

## 2017-02-25 LAB — HEPATITIS B SURFACE ANTIGEN: Hepatitis B Surface Ag: NEGATIVE

## 2017-02-25 LAB — HEPATITIS C ANTIBODY: Hep C Virus Ab: <0.1 s/co ratio (ref 0.0–0.9)

## 2017-02-25 LAB — TSH: TSH: 2.08 u[IU]/mL (ref 0.450–4.500)

## 2017-02-25 LAB — RPR: RPR: NONREACTIVE

## 2017-02-26 LAB — URINE CULTURE

## 2017-02-27 ENCOUNTER — Encounter: Payer: Self-pay | Admitting: Obstetrics & Gynecology

## 2017-02-27 DIAGNOSIS — R87810 Cervical high risk human papillomavirus (HPV) DNA test positive: Secondary | ICD-10-CM | POA: Insufficient documentation

## 2017-02-27 LAB — CYTOLOGY - PAP
Chlamydia: NEGATIVE
Diagnosis: NEGATIVE
HPV 16/18/45 genotyping: POSITIVE — AB
HPV: DETECTED — AB
NEISSERIA GONORRHEA: NEGATIVE
Trichomonas: NEGATIVE

## 2017-03-03 LAB — VITAMIN D 25 HYDROXY (VIT D DEFICIENCY, FRACTURES): Vit D, 25-Hydroxy: 55 ng/mL (ref 30.0–100.0)

## 2017-03-03 LAB — SPECIMEN STATUS REPORT

## 2017-03-19 ENCOUNTER — Ambulatory Visit (INDEPENDENT_AMBULATORY_CARE_PROVIDER_SITE_OTHER): Payer: Managed Care, Other (non HMO) | Admitting: Obstetrics & Gynecology

## 2017-03-19 ENCOUNTER — Encounter: Payer: Self-pay | Admitting: Obstetrics & Gynecology

## 2017-03-19 VITALS — BP 121/83 | HR 80 | Ht 63.0 in | Wt 141.0 lb

## 2017-03-19 DIAGNOSIS — R87810 Cervical high risk human papillomavirus (HPV) DNA test positive: Secondary | ICD-10-CM | POA: Diagnosis not present

## 2017-03-19 DIAGNOSIS — Z3202 Encounter for pregnancy test, result negative: Secondary | ICD-10-CM

## 2017-03-19 DIAGNOSIS — Z01812 Encounter for preprocedural laboratory examination: Secondary | ICD-10-CM

## 2017-03-19 LAB — POCT URINE PREGNANCY: PREG TEST UR: NEGATIVE

## 2017-03-19 NOTE — Progress Notes (Signed)
    GYNECOLOGY CLINIC COLPOSCOPY PROCEDURE NOTE  46 y.o. G1P1 here for colposcopy for normal cytology with POSITIVE high risk HPV pap smear on 02/24/17.  Similar results in 02/2016 with benign colposcopy.  Discussed role for HPV in cervical dysplasia, need for surveillance.  Patient given informed consent, signed copy in the chart, time out was performed.  Placed in lithotomy position. Cervix viewed with speculum and colposcope after application of acetic acid.   Colposcopy adequate? Yes No visible lesions, no biopsy obtained.  ECC specimen obtained and sent to pathology.   Patient was given post procedure instructions.  Will follow up pathology and manage accordingly; patient will be contacted with results and recommendations.  Routine preventative health maintenance measures emphasized.    Jaynie CollinsUGONNA  ANYANWU, MD, FACOG Attending Obstetrician & Gynecologist, [facility], [facility]

## 2017-03-19 NOTE — Patient Instructions (Signed)
COLPOSCOPY POST-PROCEDURE INSTRUCTIONS  1. You may take Ibuprofen, Aleve or Tylenol for cramping if needed.  2. If Monsel's solution was used, you will have a black discharge.  3. Light bleeding is normal.  If bleeding is heavier than your period, please call.  4. Put nothing in your vagina until the bleeding or discharge stops (usually 2 or 3 days).  5. We will call you within one week with biopsy results or discuss the results at your follow-up appointment if needed.  

## 2017-03-25 ENCOUNTER — Telehealth: Payer: Self-pay

## 2017-03-25 NOTE — Telephone Encounter (Signed)
Left message informing patient of negative colposcopy results.

## 2017-04-08 ENCOUNTER — Telehealth: Payer: Self-pay | Admitting: *Deleted

## 2017-04-08 NOTE — Telephone Encounter (Signed)
"  Ms. [name] [name] could you give me a call back?"

## 2017-04-09 ENCOUNTER — Ambulatory Visit: Payer: Self-pay | Admitting: Physician Assistant

## 2017-04-09 VITALS — BP 124/72 | HR 79 | Temp 98.8°F | Resp 16

## 2017-04-09 DIAGNOSIS — J0100 Acute maxillary sinusitis, unspecified: Secondary | ICD-10-CM

## 2017-04-09 DIAGNOSIS — J301 Allergic rhinitis due to pollen: Secondary | ICD-10-CM

## 2017-04-09 MED ORDER — LEVOCETIRIZINE DIHYDROCHLORIDE 5 MG PO TABS: 5.0000 mg | ORAL_TABLET | Freq: Every evening | ORAL | 11 refills | Status: DC

## 2017-04-09 MED ORDER — PREDNISONE 10 MG PO TABS: 30.0000 mg | ORAL_TABLET | Freq: Every day | ORAL | 0 refills | Status: DC

## 2017-04-09 MED ORDER — FLUCONAZOLE 150 MG PO TABS: ORAL_TABLET | ORAL | 0 refills | Status: DC

## 2017-04-09 MED ORDER — AZITHROMYCIN 250 MG PO TABS: ORAL_TABLET | ORAL | 0 refills | Status: DC

## 2017-04-09 NOTE — Progress Notes (Signed)
S/. 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 Taking all of her oral medicine and using Flonase  Objective/: Vital signs stable alert pleasant no acute distress ENT nasal turbinates are swollen and inflamed with PTP maxillary sinus pharynx- clear neck supple heart regular sinus rhythm lungs are clear  A/: Perennial allergies, acute sinusitis  P/: She is urged to stop the Zyrtec. Rx for Xyzal 5 mg 1 daily given Rx Z-Pak as directed with type hand for when necessary use. Prednisone pulse 3 days. allergy tips reviewed and encouraged  Nasal saline products bid and prn

## 2017-04-09 NOTE — Telephone Encounter (Signed)
"  This is Phelps DodgeJessie [name] calling again.  Could you please give me a call?"

## 2017-04-09 NOTE — Telephone Encounter (Signed)
I'm returning your call.  How can I help you?  "What will my Vitamin D level have to be before he will do my surgery?"  I'll have to ask him and he will be out of the office tomorrow.  He won't be back until next week.  "Okay just let me know next week."

## 2017-04-14 ENCOUNTER — Telehealth: Payer: Self-pay | Admitting: *Deleted

## 2017-04-14 NOTE — Telephone Encounter (Signed)
I would like for it to be above 30, and stay there.   Also, please let her know that I am no longer in the [place] office. Previously we had discussed that I would do the surgery and then the first few follow-ups then she could stay with Dr. Al CorpusHyatt in [place], who is going to assist. However, after discussion before the surgery was cancelled, she will need to come [facility] of her follow-ups.   I am more than happy to do the surgery if she wishes. However, if coming [facility] is too much for her then please have her see Dr. Logan BoresEvans in [place].   Thank you.

## 2017-04-14 NOTE — Telephone Encounter (Signed)
I left patient a message that Dr. Ardelle AntonWagoner wanted it to be above 30 and to stay there.  He also wanted to make you aware that he no longer goes to the [place] office.  He said he could refer you to Dr. Logan BoresEvans if you did not want to commute.  Please call if you have any further questions.

## 2017-04-14 NOTE — Telephone Encounter (Signed)
Perfect! Yes lets check Friday and see what it is since I dropped back down. Just want to make sure it stays in the normal range. Thank you.

## 2017-04-14 NOTE — Telephone Encounter (Signed)
"  I got your message.  My level had gotten up to 70 in June.  I had it checked again in July and it had dropped to 55.  I am scheduled to have it done on Friday.  Should I wait until I get the results before I schedule my surgery?"  Yes, let's wait until we get the results on Friday.  "I got your message about coming to [facility] or switching to another doctor.  I'd rather stay with Dr. Ardelle AntonWagoner because he was recommended so highly by Dr. Al CorpusHyatt.  I'll just find someone to bring me to [facility].  Have they been sending my lab results to him?"  I am not sure but he can see via EPIC in your chart.  "I'll call back on Friday with the results and schedule surgery then."

## 2017-04-17 ENCOUNTER — Ambulatory Visit: Payer: Self-pay | Admitting: Physician Assistant

## 2017-04-17 DIAGNOSIS — Z299 Encounter for prophylactic measures, unspecified: Secondary | ICD-10-CM

## 2017-04-17 NOTE — Progress Notes (Signed)
Patient came in to have blood drawn for testing per Dr. Gabriel RungWagoner's orders.

## 2017-04-18 LAB — VITAMIN D 25 HYDROXY (VIT D DEFICIENCY, FRACTURES): VIT D 25 HYDROXY: 77.2 ng/mL (ref 30.0–100.0)

## 2017-05-15 ENCOUNTER — Telehealth: Payer: Self-pay | Admitting: *Deleted

## 2017-05-15 NOTE — Telephone Encounter (Signed)
I'm calling to let you know that Dr. Ardelle Anton said it's okay to proceed with surgery.  Would you like to schedule a date?  "Yes, I'd like to do it in November because I have some things I have to tie up at work."  He can do it on Friday, November 16.  "That date will be good."  Dr. Ardelle Anton wants to see you prior to the surgery date to go over your surgery again.  Would you like me to schedule you an appointment?  "Yes, I'd like to do it close to that date."  Patient was scheduled for a consultation on 06/12/2017. (Dr. Al Corpus is assisting.  Procedure will take 90 minutes.  Please adjust Dr. Gabriel Rung schedule.)

## 2017-05-25 DIAGNOSIS — M79676 Pain in unspecified toe(s): Secondary | ICD-10-CM

## 2017-06-02 ENCOUNTER — Ambulatory Visit: Payer: Self-pay | Admitting: Physician Assistant

## 2017-06-08 ENCOUNTER — Ambulatory Visit: Payer: Self-pay | Admitting: Obstetrics and Gynecology

## 2017-06-11 HISTORY — PX: ANKLE SURGERY: SHX546

## 2017-06-12 ENCOUNTER — Ambulatory Visit (INDEPENDENT_AMBULATORY_CARE_PROVIDER_SITE_OTHER): Payer: Managed Care, Other (non HMO) | Admitting: Podiatry

## 2017-06-12 DIAGNOSIS — M19171 Post-traumatic osteoarthritis, right ankle and foot: Secondary | ICD-10-CM | POA: Diagnosis not present

## 2017-06-12 DIAGNOSIS — M898X9 Other specified disorders of bone, unspecified site: Secondary | ICD-10-CM

## 2017-06-12 MED ORDER — OXYCODONE-ACETAMINOPHEN 10-325 MG PO TABS: 1.0000 | ORAL_TABLET | ORAL | 0 refills | Status: DC | PRN

## 2017-06-12 MED ORDER — ENOXAPARIN SODIUM 40 MG/0.4ML ~~LOC~~ SOLN: 40.0000 mg | SUBCUTANEOUS | 2 refills | Status: DC

## 2017-06-12 MED ORDER — PROMETHAZINE HCL 25 MG PO TABS: 25.0000 mg | ORAL_TABLET | Freq: Three times a day (TID) | ORAL | 0 refills | Status: DC | PRN

## 2017-06-12 MED ORDER — CEPHALEXIN 500 MG PO CAPS: 500.0000 mg | ORAL_CAPSULE | Freq: Three times a day (TID) | ORAL | 2 refills | Status: DC

## 2017-06-12 NOTE — Patient Instructions (Signed)
tPre-Operative Instructions  Congratulations, you have decided to take an important step towards improving your quality of life.  You can be assured that the doctors and staff at [facility] will be with you every step of the way.  Here are some important things you should know:  1. Plan to be at the surgery center/hospital at least 1 (one) hour prior to your scheduled time, unless otherwise directed by the surgical center/hospital staff.  You must have a responsible adult accompany you, remain during the surgery and drive you home.  Make sure you have directions to the surgical center/hospital to ensure you arrive on time. 2. If you are having surgery at [facility] or [facility], you will need a copy of your medical history and physical form from your family physician within one month prior to the date of surgery. We will give you a form for your primary physician to complete.  3. We make every effort to accommodate the date you request for surgery.  However, there are times where surgery dates or times have to be moved.  We will contact you as soon as possible if a change in schedule is required.   4. No aspirin/ibuprofen for one week before surgery.  If you are on aspirin, any non-steroidal anti-inflammatory medications (Mobic, Aleve, Ibuprofen) should not be taken seven (7) days prior to your surgery.  You make take Tylenol for pain prior to surgery.  5. Medications - If you are taking daily heart and blood pressure medications, seizure, reflux, allergy, asthma, anxiety, pain or diabetes medications, make sure you notify the surgery center/hospital before the day of surgery so they can tell you which medications you should take or avoid the day of surgery. 6. No food or drink after midnight the night before surgery unless directed otherwise by surgical center/hospital staff. 7. No alcoholic beverages 95-GLOVF prior to surgery.  No smoking 24-hours prior or 24-hours after  surgery. 8. Wear loose pants or shorts. They should be loose enough to fit over bandages, boots, and casts. 9. Don't wear slip-on shoes. Sneakers are preferred. 10. Bring your boot with you to the surgery center/hospital.  Also bring crutches or a walker if your physician has prescribed it for you.  If you do not have this equipment, it will be provided for you after surgery. 11. If you have not been contacted by the surgery center/hospital by the day before your surgery, call to confirm the date and time of your surgery. 12. Leave-time from work may vary depending on the type of surgery you have.  Appropriate arrangements should be made prior to surgery with your employer. 13. Prescriptions will be provided immediately following surgery by your doctor.  Fill these as soon as possible after surgery and take the medication as directed. Pain medications will not be refilled on weekends and must be approved by the doctor. 14. Remove nail polish on the operative foot and avoid getting pedicures prior to surgery. 15. Wash the night before surgery.  The night before surgery wash the foot and leg well with water and the antibacterial soap provided. Be sure to pay special attention to beneath the toenails and in between the toes.  Wash for at least three (3) minutes. Rinse thoroughly with water and dry well with a towel.  Perform this wash unless told not to do so by your physician.  Enclosed: 1 Ice pack (please put in freezer the night before surgery)   1 Hibiclens skin cleaner  Pre-op instructions  If you have any questions regarding the instructions, please do not hesitate to call our office.  [place]: [address], [place], [place] [address] -- [phone]  [place]: [address] [place], [place] [address] -- [phone]  [place]: [address]  [place], [place] [address] -- [phone]  [place]: [address], [address], [place], [place] [address] -- [phone]  Website:  [email or web address] 

## 2017-06-13 ENCOUNTER — Encounter: Payer: Self-pay | Admitting: Podiatry

## 2017-06-16 ENCOUNTER — Telehealth: Payer: Self-pay | Admitting: *Deleted

## 2017-06-16 ENCOUNTER — Encounter: Payer: Self-pay | Admitting: Podiatry

## 2017-06-16 NOTE — Progress Notes (Signed)
I called her to answer all the questions that she had sent through 'Mychart". After discussion and answering her questions she would like to proceed with the surgery as scheduled on Friday. I answered all of her questions to the best of my ability. Again, no promises or guarantees were given as to the outcome of the surgery. Discussed risks, complications of surgery as well. She will go ahead with surgery as planned. She had no further questions.   Ovid CurdMatthew Wagoner, DPM

## 2017-06-16 NOTE — Telephone Encounter (Signed)
Pt states she is to have surgery, and feels like she has a cold, what is she to do.

## 2017-06-17 NOTE — Progress Notes (Signed)
Subjective: [name] [name] presents the office today for surgical consultation. She is scheduled for subtalar joint arthrodesis as well as heel spur resection the plantar heel on the right side for next week. She's had ongoing pain to her right foot for quite some time after a lawnmower accident. Previously I saw her we did a steroid injection into the sinus tarsi for which she had relief. She also had MRI performed which did reveal severe arthritis of the posterior facet. She's also undergone tarsal tunnel release as well as tendon repair to the medial ankle previously with Dr. Al Corpus. Given her prolonged symptoms and continued pain she is requesting to go ahead and proceed with surgical intervention at this time. Originally her surgery was canceled due to a low vitamin D however that has been stable. She has no other concerns or questions today. Denies any systemic complaints such as fevers, chills, nausea, vomiting. No acute changes since last appointment, and no other complaints at this time.   Past Medical History:  Diagnosis Date  . Allergy   . Arthritis   . Asthma   . Constipation   . Endometriosis    Uses Nuvaring continuously  . Heart murmur   . Ulcer     Past Surgical History:  Procedure Laterality Date  . ANKLE SURGERY    . APPENDECTOMY    . CESAREAN SECTION    . CHOLECYSTECTOMY    . LAPAROSCOPIC ENDOMETRIOSIS FULGURATION    . LAPAROSCOPY     for endometriosis  . WISDOM TOOTH EXTRACTION     x2     Current Outpatient Medications:  .  acetaminophen (TYLENOL) 650 MG CR tablet, Take by mouth., Disp: , Rfl:  .  Albuterol (VENTOLIN IN), Inhale into the lungs., Disp: , Rfl:  .  azithromycin (ZITHROMAX Z-PAK) 250 MG tablet, As directed, Disp: 6 each, Rfl: 0 .  cephALEXin (KEFLEX) 500 MG capsule, Take 1 capsule (500 mg total) by mouth 3 (three) times daily., Disp: 30 capsule, Rfl: 2 .  cetirizine (ZYRTEC) 10 MG tablet, Take 10 mg by mouth daily., Disp: , Rfl:  .  cyclobenzaprine  (FLEXERIL) 5 MG tablet, Take 1 tablet (5 mg total) by mouth 3 (three) times daily as needed for muscle spasms. (Patient not taking: Reported on 03/19/2017), Disp: 30 tablet, Rfl: 0 .  enoxaparin (LOVENOX) 40 MG/0.4ML injection, Inject 0.4 mLs (40 mg total) into the skin daily., Disp: 14 Syringe, Rfl: 2 .  etonogestrel-ethinyl estradiol (NUVARING) 0.12-0.015 MG/24HR vaginal ring, Insert vaginally and leave in place for 4 consecutive weeks, then place another one. Use in continuous fashion., Disp: 3 each, Rfl: 5 .  FLOVENT HFA 110 MCG/ACT inhaler, INHALE ONE PUFF BY MOUTH TWICE DAILY, Disp: 1 Inhaler, Rfl: 12 .  fluconazole (DIFLUCAN) 150 MG tablet, Take one at onset of symptoms. May repeat in 2-3 days prn., Disp: 2 tablet, Rfl: 0 .  fluticasone (FLONASE) 50 MCG/ACT nasal spray, Place 2 sprays into both nostrils daily., Disp: 16 g, Rfl: 11 .  ibuprofen (ADVIL,MOTRIN) 800 MG tablet, TAKE ONE TABLET BY MOUTH EVERY 8 HOURS AS NEEDED, Disp: 30 tablet, Rfl: 6 .  levocetirizine (XYZAL) 5 MG tablet, Take 1 tablet (5 mg total) by mouth every evening., Disp: 30 tablet, Rfl: 11 .  montelukast (SINGULAIR) 10 MG tablet, Take 1 tablet (10 mg total) by mouth at bedtime., Disp: 30 tablet, Rfl: 3 .  nystatin ointment (MYCOSTATIN), Apply 1 application topically at bedtime., Disp: 30 g, Rfl: 0 .  nystatin-triamcinolone ointment (MYCOLOG),  Apply 1 application topically 2 (two) times daily., Disp: 30 g, Rfl: 5 .  omeprazole (PRILOSEC) 20 MG capsule, TAKE ONE CAPSULE BY MOUTH ONCE DAILY, Disp: 90 capsule, Rfl: 3 .  oxyCODONE-acetaminophen (PERCOCET) 10-325 MG tablet, Take 1 tablet by mouth every 4 (four) hours as needed for pain. MAXIMUM TOTAL ACETAMINOPHEN DOSE IS 4000 MG PER DAY, Disp: 30 tablet, Rfl: 0 .  permethrin (ELIMITE) 5 % cream, APPLY CREAM EVERY OTHER DAY TO AFFECTED AREAS OF FACE, Disp: , Rfl:  .  predniSONE (DELTASONE) 10 MG tablet, Take 3 tablets (30 mg total) by mouth daily with breakfast., Disp: 9 tablet, Rfl:  0 .  promethazine (PHENERGAN) 25 MG tablet, Take 1 tablet (25 mg total) by mouth every 8 (eight) hours as needed for nausea or vomiting., Disp: 20 tablet, Rfl: 0 .  triamcinolone cream (KENALOG) 0.1 %, Apply 1 application topically 2 (two) times daily., Disp: 30 g, Rfl: 5 .  Vitamin D, Ergocalciferol, (DRISDOL) 50000 units CAPS capsule, Take 1 capsule (50,000 Units total) by mouth every 7 (seven) days., Disp: 12 capsule, Rfl: 3  No Known Allergies  Social History   Socioeconomic History  . Marital status: Single    Spouse name: Not on file  . Number of children: Not on file  . Years of education: Not on file  . Highest education level: Not on file  Social Needs  . Financial resource strain: Not on file  . Food insecurity - worry: Not on file  . Food insecurity - inability: Not on file  . Transportation needs - medical: Not on file  . Transportation needs - non-medical: Not on file  Occupational History  . Not on file  Tobacco Use  . Smoking status: Never Smoker  . Smokeless tobacco: Never Used  Substance and Sexual Activity  . Alcohol use: No    Alcohol/week: 0.0 oz  . Drug use: No  . Sexual activity: Yes    Birth control/protection: Inserts    Comment: Nuva ring  Other Topics Concern  . Not on file  Social History Narrative  . Not on file      Objective: AAO x3, NAD DP/PT pulses palpable bilaterally, CRT less than 3 seconds There is tenderness palpation to the lateral of the foot along the sinus tarsi. There is tenderness with subtalar joint range of motion there is mild crepitation present. There is no pain with ankle joint range of motion is no pain to the ankle joint. There is no area of tenderness to the Lisfranc, midfoot joints on the right side as well. There is chronic appearing edema to the ankle but there is no erythema or increase in warmth. There is no other area tenderness identified at this time.  No open lesions or pre-ulcerative lesions.  No pain with  calf compression, swelling, warmth, erythema  MRI Right 09/18/2016: IMPRESSION: 1. 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 2. No acute injury of the right ankle.  Assessment: Right subtalar joint osteoarthritis, large inferior calcaneal spurring present.  Plan: -All treatment options discussed with the patient including all alternatives, risks, complications.  -New x-rays were obtained and reviewed. Arthritic changes present of the subtalar joint. Also arthritic changes present in the midfoot and there appears to be some adaptation of the ankle joint as well. -I discussed both conservative as well as surgical treatment options with the patient. A long discussion regards to options and she wishes to proceed with surgical intervention. -We discussed subtalar joint arthrodesis the right  side with screw fixation, inferior calcaneal spur resection and cast application. We discussed that this is not a guarantee of resolution of symptoms and given the arthritis in other issues of the foot that she may require further surgical intervention either for other issues or for nonhealing or continued pain to this area. She understands this and she wishes to proceed with surgery. -The incision placement as well as the postoperative course was discussed with the patient. I discussed risks of the surgery which include, but not limited to, infection, bleeding, pain, swelling, need for further surgery, delayed or nonhealing, painful or ugly scar, numbness or sensation changes, over/under correction, recurrence, transfer lesions, further deformity, hardware failure, DVT/PE, loss of toe/foot. Patient understands these risks and wishes to proceed with surgery. The surgical consent was reviewed with the patient all 3 pages were signed. No promises or guarantees were given to the outcome of the procedure. All questions were answered to the best of my ability. Before the surgery the patient was encouraged to call  the office if there is any further questions. The [facility] be performed at [facility] on an outpatient basis. -For DVT prophylaxis we discussed treatment options for this. She is less to proceed with Lovenox. -Lovenox, Percocet, Phenergan, Keflex was prescribed for her for postoperative medications. She is not to start any these medications before surgery and she verbally understood this. -Patient encouraged to call the office with any questions, concerns, change in symptoms.   Ovid CurdMatthew Tallia Moehring, DPM

## 2017-06-17 NOTE — Telephone Encounter (Signed)
I'm returning your call.  "I have already spoken to Dr. Ardelle AntonWagoner.  He told me I could take a decongestant.  So, I've been taking Sudafed.  I have another question.  He gave me my prescription for Oxycodone for a quantity of 30.  I took it to the pharmacy.  They would only give me 21 tablets of it.  They said it's new law that has started that only allows a certain amount of opioids to be written by doctors.  So what am I supposed to do if I run out?"  He can write you another prescription and give it to you the day of surgery.  So you will have it on hand if you need to get it filled at a later date.  "Okay, that sounds good."

## 2017-06-19 ENCOUNTER — Telehealth: Payer: Self-pay | Admitting: Emergency Medicine

## 2017-06-19 ENCOUNTER — Encounter: Payer: Self-pay | Admitting: Podiatry

## 2017-06-19 DIAGNOSIS — M7731 Calcaneal spur, right foot: Secondary | ICD-10-CM | POA: Diagnosis not present

## 2017-06-19 DIAGNOSIS — M86671 Other chronic osteomyelitis, right ankle and foot: Secondary | ICD-10-CM | POA: Diagnosis not present

## 2017-06-19 MED ORDER — FLUCONAZOLE 150 MG PO TABS: ORAL_TABLET | ORAL | 0 refills | Status: DC

## 2017-06-19 NOTE — Telephone Encounter (Signed)
Patient called and expressed that she had surgery and was given Keflex.  She forgot to ask for a Diflucan.  Wants to know if we can call in Diflucan #2 to [facility] on [address]

## 2017-06-21 ENCOUNTER — Encounter: Payer: Self-pay | Admitting: Podiatry

## 2017-06-21 NOTE — Progress Notes (Signed)
I called Ms. Mapp to see how she is doing post surgery.  She states that she is doing well and her pain is controlled.  She can wiggle her toes and she feels the nerve block is wearing off but her pain is controlled with the pain medication.  She has been icing and elevating.  She states that she did remove the scopolamine patch today because she was not sure when to do so.  She has no questions or concerns today.  She has been doing Lovenox injections without any issues.  I encouraged her to call back if there is any questions or concerns or any change in symptoms otherwise I will see her later this week as scheduled.  Vivi BarrackMatthew R Shequita Peplinski

## 2017-06-25 ENCOUNTER — Ambulatory Visit (INDEPENDENT_AMBULATORY_CARE_PROVIDER_SITE_OTHER): Payer: Managed Care, Other (non HMO) | Admitting: Podiatry

## 2017-06-25 ENCOUNTER — Ambulatory Visit (INDEPENDENT_AMBULATORY_CARE_PROVIDER_SITE_OTHER): Payer: Managed Care, Other (non HMO)

## 2017-06-25 DIAGNOSIS — M19171 Post-traumatic osteoarthritis, right ankle and foot: Secondary | ICD-10-CM | POA: Diagnosis not present

## 2017-06-25 DIAGNOSIS — M898X9 Other specified disorders of bone, unspecified site: Secondary | ICD-10-CM

## 2017-06-25 MED ORDER — OXYCODONE-ACETAMINOPHEN 5-325 MG PO TABS: 1.0000 | ORAL_TABLET | ORAL | 0 refills | Status: DC | PRN

## 2017-06-28 NOTE — Progress Notes (Signed)
Subjective: [name] [name] is a 46 y.o. is seen today in office s/p right inferior heel spur resection and STJ arthrodesis preformed on 06/26/2017. They state their pain is improved but she states that the pain medicine is making her nauseous and she is asking for a different pain medicine.  She has been nonweightbearing.  She has been icing and elevating as much possible.  Overall she states that she is doing well.  The cast does appear to be somewhat tight she thinks.. Denies any systemic complaints such as fevers, chills, nausea, vomiting. No calf pain, chest pain, shortness of breath.   Objective: General: No acute distress, AAOx3  DP/PT pulses palpable 2/4, CRT < 3 sec to all digits.  Protective sensation intact. Motor function intact.  Cast was removed today Right foot: Incision is well coapted without any evidence of dehiscence. There is no surrounding erythema, ascending cellulitis, fluctuance, crepitus, malodor, drainage/purulence. There is mild edema around the surgical site. There is mild pain along the surgical site.  Foot appears to be in rectus position arthrodesis site is stable.  There is no evidence of hematoma or fluid collection. No other areas of tenderness to bilateral lower extremities.  No other open lesions or pre-ulcerative lesions.  No pain with calf compression, swelling, warmth, erythema.   Assessment and Plan:  Status post right foot surgery, doing well with no complications   -Treatment options discussed including all alternatives, risks, and complications -X-rays were obtained and reviewed with the patient.  It was difficult to position the patient will check the x-rays without x-rays are poor quality however hardware appears to be intact. No evidence of acute fracture -Incisions are healing well.  He got here dressing was applied as well as antibiotic ointment and dressing.  A new well-padded below-knee fiberglass cast was applied making sure to pad all bony  prominences. -Ice/elevation -Pain medication as needed. Will decrease to percocet 5/325 -Monitor for any clinical signs or symptoms of infection and DVT/PE and directed to call the office immediately should any occur or go to the ER. -Follow-up in 10 days or sooner if any problems arise. In the meantime, encouraged to call the office with any questions, concerns, change in symptoms.   Ovid CurdMatthew Wagoner, DPM

## 2017-06-29 ENCOUNTER — Telehealth: Payer: Self-pay | Admitting: Podiatry

## 2017-06-29 ENCOUNTER — Other Ambulatory Visit: Payer: Self-pay | Admitting: Podiatry

## 2017-06-29 NOTE — Telephone Encounter (Signed)
Hello, this is Pathmark StoresJessie [name]. I was calling because I have a question. Can someone give me a call back at [phone]. Thank you.

## 2017-06-29 NOTE — Telephone Encounter (Signed)
Pt called and stated she would like to know how much she can do and I told her that as a rule of thumb for surgery the 1st week she could only weight bear or have the foot below her heart 15 minutes/hour, and then each week post op could increase the time 15 minutes more per hour, but if she had pain and swelling she needed to back off on her activity.  I told pt that at this time ice and elevation were to be for comfort and swelling. Pt states understanding.

## 2017-06-29 NOTE — Telephone Encounter (Signed)
Left message informing pt if she would leave her questions, often I can call back with the answers.

## 2017-06-30 ENCOUNTER — Encounter: Payer: Managed Care, Other (non HMO) | Admitting: Podiatry

## 2017-07-06 ENCOUNTER — Ambulatory Visit (INDEPENDENT_AMBULATORY_CARE_PROVIDER_SITE_OTHER): Payer: Managed Care, Other (non HMO) | Admitting: Podiatry

## 2017-07-06 DIAGNOSIS — M19171 Post-traumatic osteoarthritis, right ankle and foot: Secondary | ICD-10-CM

## 2017-07-06 DIAGNOSIS — M898X9 Other specified disorders of bone, unspecified site: Secondary | ICD-10-CM

## 2017-07-08 NOTE — Progress Notes (Signed)
Subjective: Patton SallesJessie S Mapp is a 46 y.o. is seen today in office s/p right inferior heel spur resection and STJ arthrodesis preformed on 06/26/2017.  She states that she is doing well.  She said the pain medicines upsetting her stomach so she stopped all narcotic pain medication she is only been taking ibuprofen 800 mg 3 times a day.  She has been nonweightbearing in the cast and the cast was fitting well.  She denies any systemic complaints such as fevers, chills, nausea, vomiting.  She has no calf pain, chest pain, shortness of breath.  She has finished her Lovenox and she is started aspirin daily.  She has no other concerns today.  Objective: General: No acute distress, AAOx3  DP/PT pulses palpable 2/4, CRT < 3 sec to all digits.  Protective sensation intact. Motor function intact.  Cast was removed today Right foot: Incision is well coapted without any evidence of dehiscence and sutures are intact as well as staples. There is no surrounding erythema, ascending cellulitis, fluctuance, crepitus, malodor, drainage/purulence. There is mild however improved edema around the surgical site. There is minimal pain along the surgical site.  Foot appears to be in rectus position arthrodesis site is stable.  There is no evidence of hematoma or fluid collection. No other areas of tenderness to bilateral lower extremities.  No other open lesions or pre-ulcerative lesions.  No pain with calf compression, swelling, warmth, erythema.   Assessment and Plan:  Status post right foot surgery, doing well with no complications   -Treatment options discussed including all alternatives, risks, and complications -Incisions all appear to be healing well there is minimal motion across the incision so therefore I left the sutures and staples intact.  Antibiotic ointment was applied followed by a dressing.  A well-padded below-knee fiberglass cast was applied making sure to pad all bony prominences. -Aspirin daily -Continue  nonweightbearing -Ice and elevation -Pain medication, anti-inflammatories as needed -Monitor for any clinical signs or symptoms of infection and directed to call the office immediately should any occur or go to the ER. -Follow-up in 2 weeks or sooner if needed at that time the cast will be removed as well as the sutures and staples and likely transition to a cam boot. -X-rays next appointment   Ovid CurdMatthew Candido Flott, DPM

## 2017-07-14 ENCOUNTER — Telehealth: Payer: Self-pay | Admitting: Podiatry

## 2017-07-14 ENCOUNTER — Encounter: Payer: Self-pay | Admitting: Podiatry

## 2017-07-14 ENCOUNTER — Ambulatory Visit (INDEPENDENT_AMBULATORY_CARE_PROVIDER_SITE_OTHER): Payer: Managed Care, Other (non HMO) | Admitting: Podiatry

## 2017-07-14 ENCOUNTER — Ambulatory Visit (INDEPENDENT_AMBULATORY_CARE_PROVIDER_SITE_OTHER): Payer: Managed Care, Other (non HMO)

## 2017-07-14 DIAGNOSIS — M19171 Post-traumatic osteoarthritis, right ankle and foot: Secondary | ICD-10-CM | POA: Diagnosis not present

## 2017-07-14 DIAGNOSIS — M898X9 Other specified disorders of bone, unspecified site: Secondary | ICD-10-CM

## 2017-07-14 DIAGNOSIS — M19071 Primary osteoarthritis, right ankle and foot: Secondary | ICD-10-CM | POA: Diagnosis not present

## 2017-07-14 MED ORDER — TRAMADOL HCL 50 MG PO TABS: 50.0000 mg | ORAL_TABLET | Freq: Three times a day (TID) | ORAL | 0 refills | Status: DC | PRN

## 2017-07-14 NOTE — Telephone Encounter (Signed)
Pt fell yesterday on surgical foot/ not having any pain. She said her foot just does not feel the same and was wondering if she needs to have it checked.

## 2017-07-14 NOTE — Telephone Encounter (Signed)
Yes please have her come in.

## 2017-07-14 NOTE — Progress Notes (Signed)
Subjective: [name] [name] is a 46 y.o. is seen today in office s/p right inferior heel spur resection and STJ arthrodesis preformed on 06/26/2017.  She presents today as she had a fall last night she put weight to her surgical foot she had some immediate sharp pain but the pain has improved.  She denies any other injury at the time of the accident.  She is been taken aspirin daily.  She is also been taking ibuprofen 800 mg 3 times a day which is been helping and she has not been taking any pain medication otherwise.  She has no other concerns today. She has no calf pain, chest pain, shortness of breath.  She has finished her Lovenox and she is started aspirin daily.  She has no other concerns today.  Objective: General: No acute distress, AAOx3  Presents today in a cast DP/PT pulses palpable 2/4, CRT < 3 sec to all digits.  Protective sensation intact. Motor function intact.  Cast was removed today Right foot: Incision is well coapted without any evidence of dehiscence and sutures are intact as well as staples. There is no surrounding erythema, ascending cellulitis, fluctuance, crepitus, malodor, drainage/purulence. There is mild tenderness palpation of the surgical site but there is no other areas of tenderness.  There is mild edema to the surgical foot but there is no erythema or increase in warmth there is no drainage or pus coming from the incision there is no clinical signs of infection to the foot present.   No other areas of tenderness to bilateral lower extremities.  No other open lesions or pre-ulcerative lesions.  No pain with calf compression, swelling, warmth, erythema.   Assessment and Plan:  Status post right foot surgery; presents today after fall   -Treatment options discussed including all alternatives, risks, and complications -X-rays were obtained and reviewed.  Hardware intact without any breakage.  There is no evidence of acute fracture  noted today. -All the sutures were  removed today.  Antibiotic ointment was applied followed by a bandage.  Keep the dressing clean, dry, intact. -Cam boot was dispensed today.  Keep it on at all times and continue nonweightbearing. -Given she has some bruising to her arms to hold off on aspirin at this point hospice is going to do this decreases her risk of blood clots and on her using gentle range of motion.  Also will try to take some ibuprofen and prescribe tramadol for pain as well. -Ice and elevation -Follow-up in 1 week to remove the remainder of the sutures or sooner if any issues are to arise.  Vivi BarrackMatthew R Maeven Mcdougall DPM   -Incisions all appear to be healing well there is minimal motion across the incision so therefore I left the sutures and staples intact.  Antibiotic ointment was applied followed by a dressing.  A well-padded below-knee fiberglass cast was applied making sure to pad all bony prominences. -Aspirin daily -Continue nonweightbearing -Ice and elevation -Pain medication, anti-inflammatories as needed -Monitor for any clinical signs or symptoms of infection and directed to call the office immediately should any occur or go to the ER. -Follow-up in 2 weeks or sooner if needed at that time the cast will be removed as well as the sutures and staples and likely transition to a cam boot. -X-rays next appointment   Ovid CurdMatthew Jerimiah Wolman, DPM

## 2017-07-17 ENCOUNTER — Encounter: Payer: Managed Care, Other (non HMO) | Admitting: Podiatry

## 2017-07-21 ENCOUNTER — Encounter: Payer: Self-pay | Admitting: Podiatry

## 2017-07-21 ENCOUNTER — Ambulatory Visit (INDEPENDENT_AMBULATORY_CARE_PROVIDER_SITE_OTHER): Payer: Managed Care, Other (non HMO) | Admitting: Podiatry

## 2017-07-21 ENCOUNTER — Ambulatory Visit (INDEPENDENT_AMBULATORY_CARE_PROVIDER_SITE_OTHER): Payer: Managed Care, Other (non HMO)

## 2017-07-21 DIAGNOSIS — M19171 Post-traumatic osteoarthritis, right ankle and foot: Secondary | ICD-10-CM

## 2017-07-22 NOTE — Progress Notes (Signed)
Subjective: [name] [name] is a 46 y.o. is seen today in office s/p right inferior heel spur resection and STJ arthrodesis preformed on 06/26/2017.  She presents today for potential staple removal to the remaining incisions.  She has not been having an increase in pain.  She did take tramadol but this made her sick.  She has gone back to taking the other pain medicine she has been making sure she eats well before taking the medicine and so far she is been doing well.  She is been trying to take as much ibuprofen.  She denies any recent injury or falls.  She is been wearing the boot and nonweightbearing. She has no other concerns today. She has no calf pain, chest pain, shortness of breath.  She has finished her Lovenox and she is started aspirin daily.  She has no other concerns today.  Objective: General: No acute distress, AAOx3  Presents today in a cast DP/PT pulses palpable 2/4, CRT < 3 sec to all digits.  Protective sensation intact. Motor function intact.  Cast was removed today Right foot: Incision is well coapted without any evidence of dehiscence and staples are intact the lateral incision as well as posterior.  On the medial heel incision there is a small superficial opening on the mid substance the incision a small amount of clear drainage but there is no pus.  There is no surrounding erythema, ascending cellulitis.  There is minimal edema and actually the swelling appears to be improved compared to what it has been.  There is no fluctuation or crepitation there is no malodor.  No significant increase in tenderness along the incision.   No other open lesions or pre-ulcerative lesions.  No pain with calf compression, swelling, warmth, erythema.  The calf is supple there is no evidence of DVT.  Assessment and Plan:  Status post right foot surgery; presents today after fall   -Treatment options discussed including all alternatives, risks, and complications -X-rays were obtained and reviewed.   There is no evidence of acute fracture.  Hardware intact.  Increased consolidation across the posterior subtalar joint. -I left the remainder the sutures intact today to ensure healing.  I cleaned the area to the medial heel and Steri-Strips were applied for reinforcement as well as Betadine dressing to the area.  Will start Keflex in case of infection however there is no clinical signs of infection noted but given a small and a clear drainage will do an antibiotic.  She does have a full prescription of Keflex at home and she is due to start this therefore did not sent to her pharmacy. -Remain in a cam boot and nonweightbearing all times -Ice and elevation -Monitor for any clinical signs or symptoms of infection and directed to call the office immediately should any occur or go to the ER. -Follow-up in 1 week to remove the remainder of the sutures or sooner if any issues are to arise.  Vivi BarrackMatthew R Wagoner DPM

## 2017-07-25 ENCOUNTER — Other Ambulatory Visit: Payer: Self-pay | Admitting: Podiatry

## 2017-07-27 ENCOUNTER — Telehealth: Payer: Self-pay | Admitting: Podiatry

## 2017-07-27 ENCOUNTER — Encounter: Payer: Self-pay | Admitting: Podiatry

## 2017-07-27 ENCOUNTER — Telehealth: Payer: Self-pay | Admitting: *Deleted

## 2017-07-27 MED ORDER — PROMETHAZINE HCL 25 MG PO TABS: 25.0000 mg | ORAL_TABLET | Freq: Three times a day (TID) | ORAL | 0 refills | Status: DC | PRN

## 2017-07-27 MED ORDER — CEPHALEXIN 500 MG PO CAPS: 500.0000 mg | ORAL_CAPSULE | Freq: Three times a day (TID) | ORAL | 2 refills | Status: DC

## 2017-07-27 NOTE — Telephone Encounter (Signed)
I need a refill of the cephalexin 500 mg and the promethazine 25 mg. If you can call those into the [facility] on [address] [place]. If someone could please call me back as soon as you can because one of these medications has already ran out and the cephalexin runs out tomorrow. Thank you.

## 2017-07-27 NOTE — Telephone Encounter (Signed)
I informed pt that I had sent her a message by MyChart that [id number]

## 2017-07-27 NOTE — Telephone Encounter (Signed)
escribed message to pt that Dr. Ardelle AntonWagoner had refilled the requested medications.

## 2017-07-27 NOTE — Telephone Encounter (Signed)
Dr. Ardelle AntonWagoner refilled the cephalexin and phenergan pt had requested on MyChart. I informed pt of the refills by MyChart.

## 2017-07-27 NOTE — Telephone Encounter (Signed)
[facility] stating that [facility] a [facility] on her Cephloxin ( antiibotic) and promethesene for ( nausea) . Pt has tried to reach the office and gets voice mail  Unable to speak with anyone for 3rd time. [facility] [address], [place].

## 2017-07-31 ENCOUNTER — Encounter: Payer: Self-pay | Admitting: Podiatry

## 2017-07-31 ENCOUNTER — Ambulatory Visit (INDEPENDENT_AMBULATORY_CARE_PROVIDER_SITE_OTHER): Payer: Managed Care, Other (non HMO) | Admitting: Podiatry

## 2017-07-31 DIAGNOSIS — M898X9 Other specified disorders of bone, unspecified site: Secondary | ICD-10-CM

## 2017-07-31 DIAGNOSIS — M19071 Primary osteoarthritis, right ankle and foot: Secondary | ICD-10-CM

## 2017-07-31 DIAGNOSIS — M19171 Post-traumatic osteoarthritis, right ankle and foot: Secondary | ICD-10-CM

## 2017-08-04 ENCOUNTER — Encounter: Payer: Self-pay | Admitting: Podiatry

## 2017-08-05 NOTE — Progress Notes (Signed)
Subjective: [name] [name] is a 46 y.o. is seen today in office s/p right inferior heel spur resection and STJ arthrodesis preformed on 06/19/2017.  She presents today for follow-up evaluation of a wound to the medial heel incision as well as remove the remaining staples.  She says that overall she is doing well.  She has not been taking any pain medication her pain level is only 3/10 mostly nighttime describes an aching sensation.  She is remained nonweightbearing in the cam boot.  She has been taking antibiotics as directed.  She can try to keep the foot ice elevate as much as possible.  She has no other concerns today.  She denies any systemic complaints such as fevers, chills, nausea, vomiting.  No calf pain, chest pain, shortness of breath.  Objective: General: No acute distress, AAOx3  Presents today in a cast DP/PT pulses palpable 2/4, CRT < 3 sec to all digits.  Protective sensation intact. Motor function intact.  Cam boot intact Right foot: Incision is well coapted without any evidence of dehiscence and staples are intact the lateral incision as well as posterior.  On the medial heel incision there is a small superficial opening on the mid substance the incision this appears to be only superficial area of skin breakdown this macerated tissue.  There is some dried blood, scab along the distal portion of the incision.  There is no pus expressed there is no significant drainage identified today.  There is no surrounding erythema, ascending cellulitis.  There is no clinical signs of infection noted otherwise.  No other open lesions or pre-ulcerative lesions.  No pain with calf compression, swelling, warmth, erythema.  The calf is supple there is no evidence of DVT.  Assessment and Plan:  Status post right foot surgery; presents today after fall   -Treatment options discussed including all alternatives, risks, and complications -The incision remains healing well to the other incisions and staples  removed without complications.  After removal incision remains well coapted.  -On the medial heel incision it appears to be healing but there is macerated tissue.  Continue Betadine wet-to-dry dressing changes daily. -Finished course of antibiotics -Continue nonweightbearing in CAM boot -Ice and elevation -Monitor for any clinical signs or symptoms of infection and directed to call the office immediately should any occur or go to the ER. -Follow-up as scheduled or sooner if any issues are to arise.  Vivi BarrackMatthew R Annelie Boak DPM

## 2017-08-07 ENCOUNTER — Other Ambulatory Visit: Payer: Self-pay

## 2017-08-07 ENCOUNTER — Ambulatory Visit (INDEPENDENT_AMBULATORY_CARE_PROVIDER_SITE_OTHER): Payer: Managed Care, Other (non HMO)

## 2017-08-07 ENCOUNTER — Ambulatory Visit (INDEPENDENT_AMBULATORY_CARE_PROVIDER_SITE_OTHER): Payer: Managed Care, Other (non HMO) | Admitting: Podiatry

## 2017-08-07 DIAGNOSIS — M19171 Post-traumatic osteoarthritis, right ankle and foot: Secondary | ICD-10-CM | POA: Diagnosis not present

## 2017-08-07 DIAGNOSIS — T8130XA Disruption of wound, unspecified, initial encounter: Secondary | ICD-10-CM

## 2017-08-07 DIAGNOSIS — Z299 Encounter for prophylactic measures, unspecified: Secondary | ICD-10-CM

## 2017-08-07 NOTE — Progress Notes (Signed)
Patient came in to have blood drawn for testing per Dr. Wagoner's orders. 

## 2017-08-08 ENCOUNTER — Encounter: Payer: Self-pay | Admitting: Podiatry

## 2017-08-08 LAB — CBC WITH DIFFERENTIAL/PLATELET
BASOS ABS: 0 10*3/uL (ref 0.0–0.2)
Basos: 0 %
EOS (ABSOLUTE): 0.1 10*3/uL (ref 0.0–0.4)
Eos: 1 %
Hematocrit: 41.4 % (ref 34.0–46.6)
Hemoglobin: 14.1 g/dL (ref 11.1–15.9)
IMMATURE GRANS (ABS): 0 10*3/uL (ref 0.0–0.1)
IMMATURE GRANULOCYTES: 0 %
LYMPHS: 13 %
Lymphocytes Absolute: 1.4 10*3/uL (ref 0.7–3.1)
MCH: 29.6 pg (ref 26.6–33.0)
MCHC: 34.1 g/dL (ref 31.5–35.7)
MCV: 87 fL (ref 79–97)
MONOS ABS: 0.8 10*3/uL (ref 0.1–0.9)
Monocytes: 8 %
NEUTROS PCT: 78 %
Neutrophils Absolute: 8.3 10*3/uL — ABNORMAL HIGH (ref 1.4–7.0)
PLATELETS: 375 10*3/uL (ref 150–379)
RBC: 4.77 x10E6/uL (ref 3.77–5.28)
RDW: 14.3 % (ref 12.3–15.4)
WBC: 10.6 10*3/uL (ref 3.4–10.8)

## 2017-08-08 LAB — C-REACTIVE PROTEIN: CRP: 6.6 mg/L — AB (ref 0.0–4.9)

## 2017-08-08 LAB — SEDIMENTATION RATE: SED RATE: 13 mm/h (ref 0–32)

## 2017-08-10 NOTE — Progress Notes (Signed)
Subjective: [name] [name] is a 46 y.o. is seen today in office s/p right inferior heel spur resection and STJ arthrodesis preformed on 06/19/2017.  She presents today for follow-up evaluation of the wound on the medial heel incision.  She had apparently sent an email to the office on December 25 stating that the wound was not healing well. I did not receive this notification and just happen to see this in her chart. I apologized that nobody from our office had returned or acknowledge that she had sent a message.  She states that she did have some white skin around the incision that she was concerned about but the wound actually looks better today than it did when she sent the message.  She denies any drainage or pus she denies any surrounding redness or red streaks.  She denies any swelling to the foot or leg and overall she states that she feels well and she denies any systemic complaints such as fevers, chills, nausea, vomiting.  She denies any calf pain, chest pain, shortness of breath.  She has no other concerns today.  Objective: General: No acute distress, AAOx3  Presents today in a cast DP/PT pulses palpable 2/4, CRT < 3 sec to all digits.  Protective sensation intact. Motor function intact.  Cam boot intact Right foot: Lateral incision appears to be healing well there is no opening to the incision.  There is no pain along this area there is no drainage or pus and there is minimal swelling.  There is no clinical signs of infection noted.  On the medial heel incision on the area of the wound dehiscence there is some macerated tissue with some fibroglandular tissue overlying the area as well as some dry, peeling skin around the incision.  I was able to sharply debride this area today to healthy, granular tissue.  The wound measured approximately 1 x 0.3 cm with a depth of 0.2 cm.  There is no probing, undermining or tunneling.  There is no areas of fluctuance or crepitus.  There is no malodor.  There is  no evidence of any abscess.  Mild edema to this area.  No tenderness palpation of this area.  I was unable to appreciate any evidence of an abscess. No other areas of tenderness identified today. No other open lesions or pre-ulcerative lesions identified today. No pain with calf compression, swelling, warmth, erythema.  The calf is supple there is no evidence of DVT.  Assessment and Plan:  Status post right foot surgery; presents today for follow-up of wound dehiscence  -Treatment options discussed including all alternatives, risks, and complications -X-rays were obtained and reviewed.  There is no evidence of acute cortical destruction suggestive of osteomyelitis and there is no soft tissue emphysema.  Hardware intact.  There does appear to be evidence of increased consolidation across the arthrodesis site. -After verbal consent was obtained I cleaned the medial heel wound with alcohol and then I sharply debrided the wound to healthy, bleeding, granular tissue.  After debridement the wound appeared to be healthy.  There is no evidence of infection but I do want her to continue with the antibiotics as a precaution.  I took a wound culture the area today.  Blood work was ordered including ESR, CRP, CBC.   -Continue cam boot, elevation and nonweightbearing. -She is not taking any pain medication. -Follow-up in 1 week or sooner if needed. She agrees to this plan and has no further questions or concerns. As she has  had multiple difficulties contacting the office I have given her my personal cell phone number to cal if there are any questions or concerns. She was thankful of this.   Trula Slade DPM

## 2017-08-12 ENCOUNTER — Encounter: Payer: Self-pay | Admitting: Podiatry

## 2017-08-13 LAB — WOUND CULTURE

## 2017-08-14 ENCOUNTER — Encounter: Payer: Self-pay | Admitting: Podiatry

## 2017-08-14 ENCOUNTER — Ambulatory Visit (INDEPENDENT_AMBULATORY_CARE_PROVIDER_SITE_OTHER): Payer: Managed Care, Other (non HMO) | Admitting: Podiatry

## 2017-08-14 DIAGNOSIS — T8130XA Disruption of wound, unspecified, initial encounter: Secondary | ICD-10-CM

## 2017-08-14 DIAGNOSIS — M19171 Post-traumatic osteoarthritis, right ankle and foot: Secondary | ICD-10-CM

## 2017-08-16 NOTE — Progress Notes (Signed)
Subjective: [name] [name] is a 47 y.o. is seen today in office s/p right inferior heel spur resection and STJ arthrodesis preformed on 06/19/2017.  She states that overall she is doing well the wound appears to be healing much better since last appointment.  She has noticed a small white area around the wound that she is concerned about.  She denies any surrounding redness and she has not has any red streaks or any increase in swelling.  She is trying to get some sensation back.  She states that the inside aspect along with the incision for the heel that area has always been more sensitive to even prior to the surgery.  Otherwise she states that she is doing well.  She has not been taking any pain medication.  Overall she states that she continues to feel well and she denies any complaints including fevers, chills, nausea, vomiting, chest pain, shortness of breath right calf pain.  She has no other questions or concerns today.   Objective: General: No acute distress, AAOx3  Presents today in a cast DP/PT pulses palpable 2/4, CRT < 3 sec to all digits.  Protective sensation intact. Motor function intact.  Cam boot intact Right foot: Lateral incision appears to be healing well there is no opening to the incision.  Scab is along the area and this was easily removed today without any complications.  Along the medial heel incision this is much improved compared to what it was.  There is a small amount of white skin on the distal portion of the incision as well as the proximal aspect of macerated tissue but there is no purulence identified.  There is trace edema but there is no areas of fluctuation or crepitation.  There is no malodor.  There is no ascending cellulitis.    There is no pain with ankle joint range of motion there is no crepitation or restriction.  No tenderness palpation of the ankle joint.  There is no significant discomfort along the surgical site. No other open lesions or pre-ulcerative  lesions identified today. No pain with calf compression, swelling, warmth, erythema.  The calf is supple there is no evidence of DVT.  Assessment and Plan:  Status post right foot surgery; presents today for follow-up of wound dehiscence which is improved  -Treatment options discussed including all alternatives, risks, and complications -I sharply debrided the wound on the medial heel today without any complications to healthy, granular tissue after verbal consent was obtained with a 312 blade scalpel.  This one Steri-Strip was applied just for reinforcement although today there is minimal motion across the area and appears to be doing much better.  I want her to continue the small amount of Betadine to the area followed by dressing twice a day given that she sweats quite a bit.   -Continue cam boot and nonweightbearing. -Ice and elevation -I reviewed blood work as well as a wound culture results with her again today.  Should no further questions or concerns about this. -I applied a small amount of Betadine and a dry dressing to the wound. -Follow-up in 1 week or sooner if needed.  Call any questions or concerns meantime.  Vivi BarrackMatthew R Wagoner DPM

## 2017-08-21 ENCOUNTER — Ambulatory Visit (INDEPENDENT_AMBULATORY_CARE_PROVIDER_SITE_OTHER): Payer: Managed Care, Other (non HMO) | Admitting: Podiatry

## 2017-08-21 DIAGNOSIS — M19171 Post-traumatic osteoarthritis, right ankle and foot: Secondary | ICD-10-CM

## 2017-08-21 DIAGNOSIS — T8130XA Disruption of wound, unspecified, initial encounter: Secondary | ICD-10-CM

## 2017-08-24 NOTE — Progress Notes (Signed)
Subjective: [name] [name] is a 47 y.o. is seen today in office s/p right inferior heel spur resection and STJ arthrodesis preformed on 06/19/2017.  She states that she is doing much better and the wound healed.  She denies any drainage or pus and she denies any increase in swelling and denies any redness or warmth to her feet.  She has been in the cam boot and nonweightbearing.  She has no questions or concerns.  No injury since I last saw her. Denies any complaints including fevers, chills, nausea, vomiting, chest pain, shortness of breath right calf pain.  She has no other questions or concerns today.   Objective: General: No acute distress, AAOx3  Presents today in a cast DP/PT pulses palpable 2/4, CRT < 3 sec to all digits.  Protective sensation intact. Motor function intact.  Cam boot intact The lateral incision is healing well there is a scar overlying the area without any skin breakdown.  Along the medial heel incision this area appears to be healed today there is no definitive skin breakdown.  There is dry scaly skin present of the heel as well for the heel posterior incision was removed.  There is no drainage or pus there is no surrounding erythema.  The wounds appear to be healed at this time.  No clinical signs of infection noted.  No tenderness palpation of the surgical sites. No other open lesions or pre-ulcerative lesions.  There is no pain with calf compression, swelling, warmth, erythema.  There is no evidence of DVT today.          Assessment and Plan:  Status post right foot surgery; presents today for follow-up of wound dehiscence which is much improved  -Treatment options discussed including all alternatives, risks, and complications -I was able to debride some of the loose skin of the foot today and it appears that the wounds have almost completely healed.  There is no clinical signs of infection.  She has a couple days left of the antibiotic that she is been on as a  precaution we are going to finish this.  Once this is complete hemorrhage hold off any further antibiotics. -I want her to continue the small amount of Betadine to the areas daily. -Compression. -Elevation -Pain medication as needed but she has not been taking this. -Small amount of Betadine was applied to the wounds followed by dry sterile dressing. -Continue cam boot and nonweightbearing -Follow-up in 1 week or sooner if needed.  Call any questions or concerns meantime.  Vivi BarrackMatthew R Wagoner DPM

## 2017-08-28 ENCOUNTER — Ambulatory Visit (INDEPENDENT_AMBULATORY_CARE_PROVIDER_SITE_OTHER): Payer: Managed Care, Other (non HMO) | Admitting: Podiatry

## 2017-08-28 ENCOUNTER — Ambulatory Visit (INDEPENDENT_AMBULATORY_CARE_PROVIDER_SITE_OTHER): Payer: Managed Care, Other (non HMO)

## 2017-08-28 DIAGNOSIS — T8130XA Disruption of wound, unspecified, initial encounter: Secondary | ICD-10-CM

## 2017-08-28 DIAGNOSIS — M19171 Post-traumatic osteoarthritis, right ankle and foot: Secondary | ICD-10-CM

## 2017-08-31 NOTE — Progress Notes (Signed)
Subjective: [name] [name] is a 47 y.o. is seen today in office s/p right inferior heel spur resection and STJ arthrodesis preformed on 06/19/2017.  She states that the foot "looks better and I am not having any pain".  She also states that the incisions have healed and she has not noticed any swelling or drainage redness or warmth to the area.  She is still nonweightbearing using the cam boot.  She has not antibiotic at this point.  She has no other concerns today.  No recent injury or trauma denies any complaints including fevers, chills, nausea, vomiting, chest pain, shortness of breath right calf pain.  She has no other questions or concerns today.   Objective: General: No acute distress, AAOx3  Presents today in a cast DP/PT pulses palpable 2/4, CRT < 3 sec to all digits.  Protective sensation intact. Motor function intact.  Cam boot intact The lateral incision is healing well there is a scar overlying the area without any skin breakdown.  Along the medial heel incision this area appears to be healed today.  Small area of hyperkeratotic tissue along the proximal aspect incision but upon debridement there is no underlying ulceration drainage or signs of infection.  Incision appears to be healed.  Also the incision of the posterior heel appears to be healed.  There is no minimal edema to the foot there is no erythema or increase in warmth.  No clinical signs of infection are noted today. No other open lesions or pre-ulcerative lesions.  There is no evidence of DVT today.            Assessment and Plan:  Status post right foot surgery; presents today for follow-up of wound dehiscence which is healed  -Treatment options discussed including all alternatives, risks, and complications -It appears that the incisions of healed I debrided a small amount of hyperkeratotic tissue on the medial heel wound to ensure this.  She can start to shower tomorrow when her to use just a small amount of  antibiotic ointment and a bandage over all the incisions daily.  Continuing cam boot but she can start to be partial weightbearing in the cam boot.  We discussed how to do this today.  Ice and elevation.  Pain medication as needed but she has not been taking this. -I will see her back in 2 weeks.  At that point we will get a new x-ray and likely transition to full weightbearing in the cam boot and start physical therapy.  She agrees this plan she has no further questions or concerns.  She is happy with the outcome of the surgery at this point.  Vivi BarrackMatthew R Wagoner DPM

## 2017-09-07 ENCOUNTER — Encounter: Payer: Self-pay | Admitting: Podiatry

## 2017-09-08 ENCOUNTER — Other Ambulatory Visit: Payer: Self-pay | Admitting: Podiatry

## 2017-09-08 MED ORDER — FLUCONAZOLE 150 MG PO TABS: 150.0000 mg | ORAL_TABLET | Freq: Every day | ORAL | 0 refills | Status: DC

## 2017-09-11 NOTE — Progress Notes (Signed)
DOS 06/19/17 Rt. Foot subtalar joint fusion w/ screw fixation, removal of bone spur form bottom of heel; cast application

## 2017-09-15 ENCOUNTER — Encounter: Payer: Self-pay | Admitting: Podiatry

## 2017-09-15 ENCOUNTER — Ambulatory Visit (INDEPENDENT_AMBULATORY_CARE_PROVIDER_SITE_OTHER): Payer: Managed Care, Other (non HMO)

## 2017-09-15 ENCOUNTER — Ambulatory Visit (INDEPENDENT_AMBULATORY_CARE_PROVIDER_SITE_OTHER): Payer: Managed Care, Other (non HMO) | Admitting: Podiatry

## 2017-09-15 DIAGNOSIS — M19171 Post-traumatic osteoarthritis, right ankle and foot: Secondary | ICD-10-CM | POA: Diagnosis not present

## 2017-09-15 DIAGNOSIS — Z981 Arthrodesis status: Secondary | ICD-10-CM

## 2017-09-16 NOTE — Progress Notes (Signed)
Subjective: [name] [name] is a 47 y.o. is seen today in office s/p right inferior heel spur resection and STJ arthrodesis preformed on 06/19/2017.  Since last appointment she states all of her incisions have healed very nicely there is no openings.  She is also been putting some weight to her foot in the cam boot with crutches and she states that it feels "fine".  She has not been taking any pain medication.  She gets some occasional swelling although mild.  Denies any redness or warmth.  She currently denies any pain.  She has no other concerns today.  No recent injury or trauma denies any complaints including fevers, chills, nausea, vomiting, chest pain, shortness of breath right calf pain.  She has no other questions or concerns today.   Objective: General: No acute distress, AAOx3  Presents today in a cast DP/PT pulses palpable 2/4, CRT < 3 sec to all digits.  Protective sensation intact. Motor function intact.  Cam boot intact All the incisions are well-healed the scars overlying the area.  There is no erythema or increase in warmth.  Mild edema to the surgical foot.  No significant discomfort of the surgical sites.  No other areas of tenderness identified bilaterally.  Foot is in rectus position.  Ankle joint range of motion intact but any crepitation or restriction.  No pain with ankle joint range of motion. No other open lesions or pre-ulcerative lesions.  There is no evidence of DVT today.         Assessment and Plan:  Status post right foot surgery; presents today for follow-up of wound dehiscence which is healed  -Treatment options discussed including all alternatives, risks, and complications -X-rays were obtained and reviewed with the patient.  Hardware intact.  Increased consolidation to the posterior facet of the subtalar joint.  No evidence of acute fracture -At this point I want her to transition to complete weightbearing in the cam boot if able.  And start physical therapy to  help work on this. -Continue ice elevation -Prescription for physical therapy, [name] [name] physical therapy was provided today -Hopefully return to work date changed to March 1 a note was provided today. -I will see her back in 2 weeks or sooner if any issues are to arise.  She has no further questions or concerns today.  She is in much better spirits of the surgery and she has no concerns.  Vivi BarrackMatthew R Wagoner DPM

## 2017-09-20 ENCOUNTER — Encounter: Payer: Self-pay | Admitting: Podiatry

## 2017-09-21 ENCOUNTER — Other Ambulatory Visit: Payer: Self-pay | Admitting: Podiatry

## 2017-09-21 MED ORDER — FLUCONAZOLE 150 MG PO TABS: 150.0000 mg | ORAL_TABLET | Freq: Every day | ORAL | 0 refills | Status: DC

## 2017-09-22 ENCOUNTER — Encounter: Payer: Self-pay | Admitting: Podiatry

## 2017-10-02 ENCOUNTER — Ambulatory Visit: Payer: Self-pay

## 2017-10-05 ENCOUNTER — Encounter: Payer: Self-pay | Admitting: Podiatry

## 2017-10-05 ENCOUNTER — Ambulatory Visit (INDEPENDENT_AMBULATORY_CARE_PROVIDER_SITE_OTHER): Payer: Managed Care, Other (non HMO)

## 2017-10-05 ENCOUNTER — Ambulatory Visit (INDEPENDENT_AMBULATORY_CARE_PROVIDER_SITE_OTHER): Payer: Managed Care, Other (non HMO) | Admitting: Podiatry

## 2017-10-05 DIAGNOSIS — M19171 Post-traumatic osteoarthritis, right ankle and foot: Secondary | ICD-10-CM | POA: Diagnosis not present

## 2017-10-05 DIAGNOSIS — Z981 Arthrodesis status: Secondary | ICD-10-CM | POA: Diagnosis not present

## 2017-10-05 NOTE — Progress Notes (Signed)
Subjective: [name] [name] is a 47 y.o. is seen today in office s/p right inferior heel spur resection and STJ arthrodesis preformed on 06/19/2017.  Since last appointment she has been weightbearing in the boot but even at home she will go without the boot at times.  She uses crutches when she leaves the house.  Overall she states that she gets an occasional discomfort and some sharp pains occasionally to the toes but this is intermittent.  She is not having to take any pain medication.  She has been going to physical therapy.  She has some occasional swelling denies any redness.  She has no other concerns today.  There is mild edema on the surgical sites but there is no erythema or increase in warmth.  There is no no recent injury or trauma denies any complaints including fevers, chills, nausea, vomiting, chest pain, shortness of breath right calf pain.  She has no other questions or concerns today.   Objective: General: No acute distress, AAOx3  Presents today in a cast DP/PT pulses palpable 2/4, CRT < 3 sec to all digits.  Protective sensation intact. Motor function intact.  Cam boot intact All the incisions are well-healed the scars overlying the area.  There is no surrounding erythema, ascending cellulitis.  There is no drainage or pus the incisions are well-healed without any signs of infection.  There is mild edema to the surgical sites.  There is mild tenderness along the lateral aspect of the subtalar joint fusion site.  There is no pain to the heel.  There is no pain or crepitation with ankle joint range of motion and there is no restriction with ankle range of motion.  Subtalar joint arthrodesis site appears to be stable. No open lesions or pre-ulcerative lesions. No pain with calf compression, swelling, warmth, erythema.     Assessment and Plan:  Status post right foot surgery; presents today for follow-up of wound dehiscence which is healed  -Treatment options discussed including all  alternatives, risks, and complications -X-rays were obtained and reviewed with the patient.  Hardware intact.  Increased consolidation to the posterior facet of the subtalar joint.  No evidence of acute fracture. -At this time we discussed transitioning to full weightbearing in the cam boot at all times and she can start to try to wear a regular shoe as able.  We dispensed the Tri-Lock ankle brace for her to wear today and that way she can transition to this with a regular shoe.  I want her to continue with ice elevation as well.  She is not taking any pain medication.  She is scheduled go back to work on March 1 and I wrote her a letter with restrictions that she needs to have a desk duty and limit driving.  We will do this until I see her back next appointment.  Vivi BarrackMatthew R Wagoner DPM

## 2017-10-07 ENCOUNTER — Encounter: Payer: Self-pay | Admitting: Podiatry

## 2017-10-28 ENCOUNTER — Encounter: Payer: Self-pay | Admitting: Family Medicine

## 2017-10-28 ENCOUNTER — Ambulatory Visit: Payer: Self-pay | Admitting: Family Medicine

## 2017-10-28 VITALS — BP 112/78 | HR 63 | Temp 98.2°F | Resp 16

## 2017-10-28 DIAGNOSIS — H6981 Other specified disorders of Eustachian tube, right ear: Secondary | ICD-10-CM

## 2017-10-28 MED ORDER — FLUTICASONE PROPIONATE 50 MCG/ACT NA SUSP: 2.0000 | Freq: Every day | NASAL | 0 refills | Status: DC

## 2017-10-28 NOTE — Progress Notes (Signed)
Subjective: Right ear pain HPI:  [name] [name] is a 47 y.o. female who presents for evaluation of right ear pressure, pain below right ear below the angle of the right jaw, scratchy throat, dry cough, runny nose with clear nasal discharge for 4 days. Denies fevers, malaise, fatigue, any other systemic symptoms.  Patient has a history of seasonal allergic rhinitis and reports recent worsening of her symptoms.  Patient takes Zyrtec and Singulair for this.  Patient also uses albuterol for asthma, which is well controlled, has not had to increase use of this with these symptoms.  Does not currently use a steroid nasal spray.  Patient is using nasal steroid spray in the past and tolerated this well.  Patient endorses pain just inferior to the mandibular angle.  Denies any significant history of ear problems or surgeries in the past. Treatment to date: None.  Denies any other symptoms or complaints.   Review of Systems Pertinent items noted in HPI and remainder of comprehensive ROS otherwise negative.     Objective:   Physical Exam General: Awake, alert, and oriented. No acute distress. Well developed, hydrated and nourished. Appears stated age. Nontoxic appearance.  HEENT:  PND noted.  No erythema to posterior oropharynx.  No edema or exudates of pharynx or tonsils. No erythema, opaque fluid, or bulging of TM.  Mild right tympanic membrane retraction noted.  No erythema/edema to nasal mucosa. Pale, boggy nasal mucosa. Sinuses nontender. Supple neck without adenopathy. Cardiac: Heart rate and rhythm are normal. No murmurs, gallops, or rubs are auscultated. S1 and S2 are heard and are of normal intensity.  Respiratory: No signs of respiratory distress. Lungs clear. No tachypnea. Able to speak in full sentences without dyspnea. Nonlabored respirations.   Skin: Skin is warm, dry and intact. Appropriate color for ethnicity. No cyanosis noted.    Assessment:   Eustachian Tube Dysfunction Allergic Rhinitis    Plan:   Prescribed Flonase and educated patient regarding dosage and instructions for use.  Discussed red flag symptoms and circumstances with which to seek medical care.  Follow-up with primary care provider.  New Prescriptions   FLUTICASONE (FLONASE) 50 MCG/ACT NASAL SPRAY    Place 2 sprays into both nostrils daily.

## 2017-10-30 ENCOUNTER — Ambulatory Visit (INDEPENDENT_AMBULATORY_CARE_PROVIDER_SITE_OTHER): Payer: Managed Care, Other (non HMO) | Admitting: Podiatry

## 2017-10-30 ENCOUNTER — Ambulatory Visit (INDEPENDENT_AMBULATORY_CARE_PROVIDER_SITE_OTHER): Payer: Managed Care, Other (non HMO)

## 2017-10-30 DIAGNOSIS — M19171 Post-traumatic osteoarthritis, right ankle and foot: Secondary | ICD-10-CM

## 2017-10-30 DIAGNOSIS — Z981 Arthrodesis status: Secondary | ICD-10-CM

## 2017-11-02 NOTE — Progress Notes (Signed)
Subjective: [name] [name] is a 47 y.o. is seen today in office s/p right inferior heel spur resection and STJ arthrodesis preformed on 06/19/2017.  Since I last saw her she is continue with physical therapy.  She is also been wearing an ankle brace on the right side.  She has been working full-time however she does have restrictions.  She does more desk duty and paperwork and she has not been doing much driving to do home visits.  She still gets some pain to the bottom of her heel she states that it feels different than it did before surgery.  Overall she does feel that her pain is improved compared to what it was prior to surgery so far.  She still gets some occasional swelling although improving.  Denies any redness or warmth.  She denies any recent injury or trauma since last appointment she has no other concerns today.  Objective: General: No acute distress, AAOx3  Presents today in a cast DP/PT pulses palpable 2/4, CRT < 3 sec to all digits.  Protective sensation intact. Motor function intact.  Cam boot intact All the incisions are well-healed the scars overlying the area.  There is no surrounding erythema, ascending cellulitis or any fluctuation or crepitation or any clinical signs of infection.  There is tenderness to palpation of the plantar aspect of the heel although mild.  Mild discomfort along the lateral aspect of the foot just distal to the incision site and this appears to be more along the course of the peroneal tendons just proximal to the fifth metatarsal base.  There is no significant discomfort to the ankle joint or anterior lateral ankle joint today.  Ankle joint range of motion intact. Mild edema to the foot. No open lesions or pre-ulcerative lesions. No pain with calf compression, swelling, warmth, erythema.  Assessment and Plan:  Status post right foot surgery, improving  -Treatment options discussed including all alternatives, risks, and complications -X-rays were obtained  and reviewed.  Hardware intact.  There is increased consolidation across the posterior facet of the subtalar joint. -We discussed gradual increase in activity level.  We will continue with physical therapy as well.  Ankle brace as needed as she continues to increase her activity and she is doing better I want her to wean off of wearing the ankle brace.  Continue ice and elevate.  Also anti-inflammatories as needed.  Discussed with her to gradually increase her walking.  We will keep her on restrictions at work and will start to increase her walking otherwise and now she is doing better than we will take her off her restrictions at work.  She agrees to this plan.  Vivi BarrackMatthew R Samyak Sackmann DPM

## 2017-11-10 ENCOUNTER — Encounter: Payer: Self-pay | Admitting: Family Medicine

## 2017-11-10 ENCOUNTER — Other Ambulatory Visit: Payer: Self-pay

## 2017-11-10 ENCOUNTER — Ambulatory Visit: Payer: Managed Care, Other (non HMO) | Admitting: Family Medicine

## 2017-11-10 VITALS — BP 108/64 | HR 100 | Temp 98.5°F | Ht 63.0 in | Wt 134.6 lb

## 2017-11-10 DIAGNOSIS — J0100 Acute maxillary sinusitis, unspecified: Secondary | ICD-10-CM

## 2017-11-10 MED ORDER — ALBUTEROL SULFATE HFA 108 (90 BASE) MCG/ACT IN AERS
2.0000 | INHALATION_SPRAY | Freq: Four times a day (QID) | RESPIRATORY_TRACT | 0 refills | Status: DC | PRN
Start: 2017-11-10 — End: 2017-11-19

## 2017-11-10 MED ORDER — AMOXICILLIN-POT CLAVULANATE 875-125 MG PO TABS: 1.0000 | ORAL_TABLET | Freq: Two times a day (BID) | ORAL | 0 refills | Status: DC

## 2017-11-10 MED ORDER — BENZONATATE 100 MG PO CAPS: 100.0000 mg | ORAL_CAPSULE | Freq: Three times a day (TID) | ORAL | 0 refills | Status: DC | PRN

## 2017-11-10 MED ORDER — FLUCONAZOLE 150 MG PO TABS: 150.0000 mg | ORAL_TABLET | Freq: Once | ORAL | 0 refills | Status: AC

## 2017-11-10 NOTE — Patient Instructions (Addendum)
     IF you received an x-ray today, you will receive an invoice from [facility]. Please contact [facility] at [phone] with questions or concerns regarding your invoice.   IF you received labwork today, you will receive an invoice from [facility]. Please contact [facility] at [phone] with questions or concerns regarding your invoice.   Our billing staff will not be able to assist you with questions regarding bills from these companies.  You will be contacted with the lab results as soon as they are available. The fastest way to get your results is to activate your My Chart account. Instructions are located on the last page of this paperwork. If you have not heard from us regarding the results in 2 weeks, please contact this office.     Sinusitis, Adult Sinusitis is soreness and inflammation of your sinuses. Sinuses are hollow spaces in the bones around your face. They are located:  Around your eyes.  In the middle of your forehead.  Behind your nose.  In your cheekbones.  Your sinuses and nasal passages are lined with a stringy fluid (mucus). Mucus normally drains out of your sinuses. When your nasal tissues get inflamed or swollen, the mucus can get trapped or blocked so air cannot flow through your sinuses. This lets bacteria, viruses, and funguses grow, and that leads to infection. Follow these instructions at home: Medicines  Take, use, or apply over-the-counter and prescription medicines only as told by your doctor. These may include nasal sprays.  If you were prescribed an antibiotic medicine, take it as told by your doctor. Do not stop taking the antibiotic even if you start to feel better. Hydrate and Humidify  Drink enough water to keep your pee (urine) clear or pale yellow.  Use a cool mist humidifier to keep the humidity level in your home above 50%.  Breathe in steam for 10-15 minutes, 3-4 times a day or as told by your doctor. You can do  this in the bathroom while a hot shower is running.  Try not to spend time in cool or dry air. Rest  Rest as much as possible.  Sleep with your head raised (elevated).  Make sure to get enough sleep each night. General instructions  Put a warm, moist washcloth on your face 3-4 times a day or as told by your doctor. This will help with discomfort.  Wash your hands often with soap and water. If there is no soap and water, use hand sanitizer.  Do not smoke. Avoid being around people who are smoking (secondhand smoke).  Keep all follow-up visits as told by your doctor. This is important. Contact a doctor if:  You have a fever.  Your symptoms get worse.  Your symptoms do not get better within 10 days. Get help right away if:  You have a very bad headache.  You cannot stop throwing up (vomiting).  You have pain or swelling around your face or eyes.  You have trouble seeing.  You feel confused.  Your neck is stiff.  You have trouble breathing. This information is not intended to replace advice given to you by your health care provider. Make sure you discuss any questions you have with your health care provider. Document Released: 01/14/2008 Document Revised: 03/23/2016 Document Reviewed: 05/23/2015 Elsevier Interactive Patient Education  2018 [employer]vier Inc.  

## 2017-11-10 NOTE — Progress Notes (Signed)
4/2/20198:56 AM  [name] [name] [birth year], 47 y.o. female [id number]  Chief Complaint  Patient presents with  . Cough    Has been coughing for over a week, runny nose. Some fever last week.    HPI:   Patient is a 47 y.o. female with past medical history significant for asthma who presents today for almost 2 weeks of nasal congestion, runny nose, sinus pain and cough. She was getting better and now for past several days has been getting worse. Feels increased post nasal drainage. Denies any fever, chills. Has had some wheezing but denies any SOB. Had one episode of post-tussive emesis, no diarrhea. She does not smoke.   Depression screen PHQ 2/9 11/10/2017  Decreased Interest 0  Down, Depressed, Hopeless 0  PHQ - 2 Score 0    No Known Allergies  Prior to Admission medications   Medication Sig Start Date End Date Taking? Authorizing Provider  acetaminophen (TYLENOL) 650 MG CR tablet Take by mouth.    [provider]  Albuterol (VENTOLIN IN) Inhale into the lungs.    [provider]  cetirizine (ZYRTEC) 10 MG tablet Take 10 mg by mouth daily.    [provider]  permethrin (ELIMITE) 5 % cream APPLY CREAM EVERY OTHER DAY TO AFFECTED AREAS OF FACE 10/17/16   [provider]    Past Medical History:  Diagnosis Date  . Allergy   . Arthritis   . Asthma   . Constipation   . Endometriosis    Uses Nuvaring continuously  . Heart murmur   . Ulcer     Past Surgical History:  Procedure Laterality Date  . ANKLE SURGERY    . APPENDECTOMY    . CESAREAN SECTION    . CHOLECYSTECTOMY    . LAPAROSCOPIC ENDOMETRIOSIS FULGURATION    . LAPAROSCOPY     for endometriosis  . WISDOM TOOTH EXTRACTION     x2    Social History   Tobacco Use  . Smoking status: Never Smoker  . Smokeless tobacco: Never Used  Substance Use Topics  . Alcohol use: No    Alcohol/week: 0.0 oz    Family History  Problem Relation Age of Onset  . COPD Mother   . Emphysema  Mother   . Cancer Father        unknown  . Cancer Paternal Aunt        ovarian    ROS Per hpi  OBJECTIVE:  Blood pressure 108/64, pulse 100, temperature 98.5 F (36.9 C), temperature source Oral, height 5\' 3"  (1.6 m), weight 134 lb 9.6 oz (61.1 kg), SpO2 100 %.  Physical Exam  Constitutional: She is oriented to person, place, and time and well-developed, well-nourished, and in no distress.  HENT:  Head: Normocephalic and atraumatic.  Right Ear: Hearing, tympanic membrane, external ear and ear canal normal.  Left Ear: Hearing, tympanic membrane, external ear and ear canal normal.  Nose: Right sinus exhibits no maxillary sinus tenderness and no frontal sinus tenderness. Left sinus exhibits maxillary sinus tenderness and frontal sinus tenderness.  Mouth/Throat: Oropharynx is clear and moist.  Eyes: Pupils are equal, round, and reactive to light. EOM are normal.  Neck: Neck supple.  Cardiovascular: Normal rate, regular rhythm and normal heart sounds. Exam reveals no gallop and no friction rub.  No murmur heard. Pulmonary/Chest: Effort normal and breath sounds normal. She has no wheezes. She has no rales.  Lymphadenopathy:    She has no cervical adenopathy.  Neurological: She  is alert and oriented to person, place, and time. Gait normal.  Skin: Skin is warm and dry.     ASSESSMENT and PLAN  1. Acute non-recurrent maxillary sinusitis Discussed supportive measures, new meds r/se/b and RTC precautions. Patient educational handout given.  - amoxicillin-clavulanate (AUGMENTIN) 875-125 MG tablet; Take 1 tablet by mouth 2 (two) times daily. - benzonatate (TESSALON) 100 MG capsule; Take 1-2 capsules (100-200 mg total) by mouth 3 (three) times daily as needed for cough. - albuterol (PROVENTIL HFA;VENTOLIN HFA) 108 (90 Base) MCG/ACT inhaler; Inhale 2 puffs into the lungs every 6 (six) hours as needed for wheezing or shortness of breath. - fluconazole (DIFLUCAN) 150 MG tablet; Take 1 tablet  (150 mg total) by mouth once for 1 dose.  Return if symptoms worsen or fail to improve.    Myles LippsIrma M Santiago, MD [facility] [address] [place], [place] [address] Ph.  [phone] Fax [phone]

## 2017-11-19 ENCOUNTER — Ambulatory Visit: Payer: Self-pay | Admitting: Family Medicine

## 2017-11-19 ENCOUNTER — Encounter: Payer: Self-pay | Admitting: Family Medicine

## 2017-11-19 VITALS — BP 119/69 | HR 72 | Resp 16

## 2017-11-19 DIAGNOSIS — J029 Acute pharyngitis, unspecified: Secondary | ICD-10-CM

## 2017-11-19 LAB — POCT RAPID STREP A (OFFICE): Rapid Strep A Screen: NEGATIVE

## 2017-11-19 NOTE — Progress Notes (Signed)
Subjective: sore throat      [name] [name] is a 47 y.o. female who presents for evaluation of sore throat and hoarseness for 1 day.  Patient was recently diagnosed with acute maxillary sinusitis on 11/10/17 and treated with Augmentin, which she completed yesterday.  Patient reports significant improvement in the symptoms and that she now only has a mild nonproductive cough and continued mild postnasal drip.  Patient also has a history of allergic rhinitis, which she takes Zyrtec and Flonase for.  Patient denies fever or chills.  Patient able to handle secretions and swallow without difficulty. Treatment to date: Warm liquids.  Denies nausea, vomiting, diarrhea, rash, shortness of breath, wheezing, chest or back pain, ear pain, difficulty swallowing, confusion, AMS, facial pressure, headache, body aches, fatigue, initial improvement and then worsening of symptoms, or severe symptoms.  Review of Systems Pertinent items noted in HPI and remainder of comprehensive ROS otherwise negative.     Objective:   Physical Exam General: Awake, alert, and oriented. No acute distress. Well developed, hydrated and nourished. Appears stated age. Nontoxic appearance.  HEENT: PND noted.  Mild erythema to posterior oropharynx.  No edema or exudates of pharynx or tonsils. No erythema or bulging of TM.  Mild erythema/edema to nasal mucosa. Sinuses nontender. Supple neck without adenopathy. Cardiac: Heart rate and rhythm are normal. No murmurs, gallops, or rubs are auscultated. S1 and S2 are heard and are of normal intensity.  Respiratory: No signs of respiratory distress. Lungs clear. No tachypnea. Able to speak in full sentences without dyspnea. Nonlabored respirations.  Skin: Skin is warm, dry and intact. Appropriate color for ethnicity. No cyanosis noted.   Diagnostic Results:  rapid strep negative.  Assessment:   Postnasal drip Laryngitis  Plan:    Suggested symptomatic OTC remedies. Nasal saline spray for  congestion.   Continue daily Flonase.  Zyrtec as needed for allergy symptoms. Provided patient with a work note and prescribed voice rest. Discussed red flag symptoms and circumstances with which to seek medical care.

## 2017-12-11 ENCOUNTER — Encounter: Payer: Self-pay | Admitting: Podiatry

## 2017-12-11 ENCOUNTER — Ambulatory Visit (INDEPENDENT_AMBULATORY_CARE_PROVIDER_SITE_OTHER): Payer: Managed Care, Other (non HMO) | Admitting: Podiatry

## 2017-12-11 DIAGNOSIS — M19171 Post-traumatic osteoarthritis, right ankle and foot: Secondary | ICD-10-CM

## 2017-12-11 DIAGNOSIS — Z981 Arthrodesis status: Secondary | ICD-10-CM

## 2017-12-13 NOTE — Progress Notes (Signed)
Subjective: [name] [name] is a 47 y.o. is seen today in office s/p right inferior heel spur resection and STJ arthrodesis preformed on 06/19/2017.  She is overall she is doing better.  She still gets some pain towards the end of the day and she points mostly to the medial heel incision.  She denies any recent injury or falls.  She also states that she will get more swelling towards the end of the day and the brace becomes tight.  She denies any redness or warmth.  She is been wearing a regular shoe which is not taking any pain medication she is also finished physical therapy.   Objective: General: No acute distress, AAOx3  Presents today in a cast DP/PT pulses palpable 2/4, CRT < 3 sec to all digits.  Protective sensation intact. Motor function intact.  Cam boot intact All the incisions are well-healed the scars overlying the area.  Overall there is decrease edema to the area and there is no erythema or increase in warmth.  She has minimal tenderness to palpation on exam today and this is mostly along the medial heel incision along with a prominent plantar exostosis was removed.  There is no significant discomfort along the sinus tarsi or along the ankle joint.  Subtalar joint appears to be stable.  There is no area pinpoint bony tenderness to the foot or ankle. No open lesions or pre-ulcerative lesions. No pain with calf compression, swelling, warmth, erythema.  Assessment and Plan:  Status post right foot surgery, improving  -Treatment options discussed including all alternatives, risks, and complications -Overall she continues to improve.  I want her to continue with rehab exercises at home.  Also dispensed a larger Tri-Lock ankle brace for her.  I did this because she has some swelling within the day and the other brace becomes tight to cause any other issues.  She is wearing a flexible Nike tennis shoe today.  I want her to try to wear a more supportive shoe as well.  Continue to ice and  elevate.  I will see her back in the next 2 months or sooner if any issues are to arise.  Call any questions or concerns meantime she agrees with this. -Also keep her at her current restrictions for work and gradually increasing her activity level.  If she feels that she can go back to full activity before I see her Naprosyn she can call and let me know however we will continue with what she is doing now and gradually increasing.  She has been doing this.  [name] [name] DPM

## 2017-12-14 ENCOUNTER — Encounter: Payer: Self-pay | Admitting: Podiatry

## 2017-12-16 ENCOUNTER — Ambulatory Visit: Payer: Managed Care, Other (non HMO) | Admitting: Family Medicine

## 2017-12-16 ENCOUNTER — Ambulatory Visit
Admission: RE | Admit: 2017-12-16 | Discharge: 2017-12-16 | Disposition: A | Discharge status: Home / Self Care | Payer: Managed Care, Other (non HMO) | Source: Ambulatory Visit | Attending: Family Medicine | Admitting: Family Medicine

## 2017-12-16 ENCOUNTER — Encounter: Payer: Self-pay | Admitting: Family Medicine

## 2017-12-16 VITALS — BP 104/64 | HR 109 | Temp 98.8°F | Resp 14 | Ht 63.0 in | Wt 139.1 lb

## 2017-12-16 DIAGNOSIS — Z1231 Encounter for screening mammogram for malignant neoplasm of breast: Secondary | ICD-10-CM | POA: Diagnosis not present

## 2017-12-16 DIAGNOSIS — R059 Cough, unspecified: Secondary | ICD-10-CM

## 2017-12-16 DIAGNOSIS — R05 Cough: Secondary | ICD-10-CM | POA: Diagnosis not present

## 2017-12-16 DIAGNOSIS — J343 Hypertrophy of nasal turbinates: Secondary | ICD-10-CM

## 2017-12-16 DIAGNOSIS — J339 Nasal polyp, unspecified: Secondary | ICD-10-CM

## 2017-12-16 DIAGNOSIS — Z1239 Encounter for other screening for malignant neoplasm of breast: Secondary | ICD-10-CM

## 2017-12-16 MED ORDER — FLUCONAZOLE 150 MG PO TABS: 150.0000 mg | ORAL_TABLET | Freq: Once | ORAL | 0 refills | Status: AC

## 2017-12-16 MED ORDER — DOXYCYCLINE HYCLATE 100 MG PO TABS: 100.0000 mg | ORAL_TABLET | Freq: Two times a day (BID) | ORAL | 0 refills | Status: DC

## 2017-12-16 MED ORDER — LEVOCETIRIZINE DIHYDROCHLORIDE 5 MG PO TABS
5.0000 mg | ORAL_TABLET | Freq: Every day | ORAL | 11 refills | Status: DC | PRN
Start: 2017-12-16 — End: 2019-11-29

## 2017-12-16 NOTE — Progress Notes (Signed)
BP 104/64   Pulse (!) 109   Temp 98.8 F (37.1 C) (Oral)   Resp 14   Ht  (1.6 m)   Wt 139 lb 1.6 oz (63.1 kg)   SpO2 98%   BMI 24.64 kg/m    Subjective:    Patient ID: [name] [name], female    DOB: 05/23/1971, 47 y.o.   MRN: [id number]  HPI: [name] [name] is a 48 y.o. female  Chief Complaint  Patient presents with  . 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 Allergy   . Arthritis   . Asthma   . Constipation   . Endometriosis    Uses Nuvaring continuously  . Heart murmur   . Ulcer    Past Surgical History:  Procedure Laterality Date  . ANKLE SURGERY    . ANKLE SURGERY Right 06/11/2017  . APPENDECTOMY    . CESAREAN SECTION    . CHOLECYSTECTOMY    . LAPAROSCOPIC ENDOMETRIOSIS FULGURATION    . LAPAROSCOPY     for endometriosis  . WISDOM TOOTH EXTRACTION     x2   Family History  Problem Relation Age of Onset  . COPD Mother   . Emphysema Mother   . Cancer Father        unknown  . Cancer Paternal Aunt        ovarian   Social History   Tobacco Use  . Smoking  status: Never Smoker  . Smokeless tobacco: Never Used  Substance Use Topics  . Alcohol use: No    Alcohol/week: 0.0 oz  . Drug use: No    Interim medical history since last visit reviewed. Allergies and medications reviewed  Review of Systems Per HPI unless specifically indicated above     Objective:    BP 104/64   Pulse (!) 109   Temp 98.8 F (37.1 C) (Oral)   Resp 14   Ht  (1.6 m)   Wt 139 lb 1.6 oz (63.1 kg)   SpO2 98%   BMI 24.64 kg/m   Wt Readings from Last 3 Encounters:  12/16/17 139 lb 1.6 oz (63.1 kg)  11/10/17 134 lb 9.6 oz (61.1 kg)  03/19/17 141 lb (64 kg)    Physical Exam  Constitutional: She appears well-developed and well-nourished.  HENT:  Right Ear: Tympanic membrane and ear canal normal.  Left Ear: Tympanic membrane and ear canal normal.  Nose: Rhinorrhea (scant clear) and nasal deformity (hypertrophy of nasal turbinates, polyp) present. No mucosal edema.  Mouth/Throat: Mucous membranes are  normal. Posterior oropharyngeal erythema (mild) present. No oropharyngeal exudate or posterior oropharyngeal edema.  Eyes: EOM are normal. No scleral icterus.  Cardiovascular: Normal rate and regular rhythm.  Pulmonary/Chest: Effort normal and breath sounds normal.  Lymphadenopathy:    She has no cervical adenopathy.  Psychiatric: She has a normal mood and affect. Her behavior is normal.    Results for orders placed or performed in visit on 11/19/17  POCT Rapid Strep A-Office (CPT [phone])  Result Value Ref Range   Rapid Strep A Screen Negative Negative      Assessment & Plan:   Problem List Items Addressed This Visit    None    Visit Diagnoses    Cough    -  Primary   mild, persistent; check CXR; otherwise may take a few weeks to run its course; may represent atypical organism; cover with abx   Relevant Orders   DG Chest 2 View (Completed)   Hypertrophy, nasal, turbinate       refer to ENT   Relevant Orders   Ambulatory referral to ENT   Nasal  polyp       refer to ENT   Relevant Orders   Ambulatory referral to ENT   Screening for breast cancer       Relevant Orders   MM DIAG BREAST TOMO BILATERAL       Follow up plan: No follow-ups on file.  An after-visit summary was printed and given to the patient at check-out.  Please see the patient instructions which may contain other information and recommendations beyond what is mentioned above in the assessment and plan.  Meds ordered this encounter  Medications  . doxycycline (VIBRA-TABS) 100 MG tablet    Sig: Take 1 tablet (100 mg total) by mouth 2 (two) times daily.    Dispense:  20 tablet    Refill:  0  . fluconazole (DIFLUCAN) 150 MG tablet    Sig: Take 1 tablet (150 mg total) by mouth once for 1 dose.    Dispense:  1 tablet    Refill:  0  . levocetirizine (XYZAL) 5 MG tablet    Sig: Take 1 tablet (5 mg total) by mouth daily as needed for allergies.    Dispense:  30 tablet    Refill:  11    Orders Placed This Encounter  Procedures  . MM DIAG BREAST TOMO BILATERAL  . DG Chest 2 View  . Ambulatory referral to ENT

## 2017-12-16 NOTE — Patient Instructions (Signed)
Start the new antibiotics Please do eat yogurt or kimchi or take a probiotic daily for the next month We want to replace the healthy germs in the gut If you notice foul, watery diarrhea in the next two months, schedule an appointment RIGHT AWAY or go to an urgent care or the emergency room if a holiday or over a weekend We'll have you see the ear nose throat doctor Have the chest xray across the street See Dr. Carlynn Purl in the next few weeks if not better

## 2017-12-22 ENCOUNTER — Encounter: Payer: Self-pay | Admitting: Radiology

## 2018-01-01 ENCOUNTER — Ambulatory Visit: Payer: Managed Care, Other (non HMO) | Admitting: Nurse Practitioner

## 2018-01-01 ENCOUNTER — Encounter: Payer: Self-pay | Admitting: Nurse Practitioner

## 2018-01-01 ENCOUNTER — Encounter

## 2018-01-01 VITALS — BP 144/72 | HR 72 | Temp 98.9°F | Resp 16 | Ht 63.0 in | Wt 140.7 lb

## 2018-01-01 DIAGNOSIS — Z889 Allergy status to unspecified drugs, medicaments and biological substances status: Secondary | ICD-10-CM

## 2018-01-01 DIAGNOSIS — K219 Gastro-esophageal reflux disease without esophagitis: Secondary | ICD-10-CM

## 2018-01-01 DIAGNOSIS — L299 Pruritus, unspecified: Secondary | ICD-10-CM

## 2018-01-01 DIAGNOSIS — J309 Allergic rhinitis, unspecified: Secondary | ICD-10-CM

## 2018-01-01 DIAGNOSIS — R030 Elevated blood-pressure reading, without diagnosis of hypertension: Secondary | ICD-10-CM

## 2018-01-01 MED ORDER — OMEPRAZOLE 20 MG PO CPDR: 20.0000 mg | DELAYED_RELEASE_CAPSULE | Freq: Every day | ORAL | 2 refills | Status: DC

## 2018-01-01 MED ORDER — FLUTICASONE PROPIONATE 50 MCG/ACT NA SUSP: 2.0000 | Freq: Every day | NASAL | 6 refills | Status: DC

## 2018-01-01 MED ORDER — MONTELUKAST SODIUM 10 MG PO TABS: 10.0000 mg | ORAL_TABLET | Freq: Every day | ORAL | 3 refills | Status: DC

## 2018-01-01 MED ORDER — PREDNISONE 5 MG (21) PO TBPK: ORAL_TABLET | ORAL | 0 refills | Status: DC

## 2018-01-01 NOTE — Patient Instructions (Addendum)
-   Please stop taking the over the counter zyrtec and just use the xyzal.  - take singulair daily to help with allergies - Flonase nasal spray BID - Start prednisone taper- can make you hungry so choose healthy foods  - Follow up with allergist if needed.   Please do call to schedule your mammogram; the number to schedule one at either [facility] or [facility] is [phone]

## 2018-01-01 NOTE — Progress Notes (Addendum)
Name: [name] [name]   MRN: [id number]    DOB: [birth year]   Date:01/01/2018       Progress Note  Subjective  Chief Complaint  Chief Complaint  Patient presents with  . Rash    Having red bumps Monday on face and since Wednesday on both arms-radiated to left arm-her skin has been itchy and complaining of scratchy throat and horseness.   . Allergic Rhinitis     Taking Xyzal at night and Zytrec in the morning- still having symptoms and has changed body wash and detergent to "all free and clear" and [place] body wash original  . Gastroesophageal Reflux    Would like a refill on Omeprazole    HPI  Patient states right arm itching and redness started on Tuesday and then on left arm the next day is now itchy all over and notes mild redness to face and both arms. sts mild redness starts to resolve at times and returns  Switched soap from [employer] to [employer] on Wednesday and switched laundry detergent all free and clear 2 weeks ago.  Denies shob, chest pain. No new medicines, bed sheets. Sleeping in same place.   Patient has raspy voice and seasonal allergies- takes zyrtec in the morning and xyzal at night- allergies are not well controlled- nasal congestion, itchy throat and nose. Has had allergy testing in the past that identified 21 allergens, patient started taking allergy shots 3-4 years ago but got to expensive.    Patient Active Problem List   Diagnosis Date Noted  . 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 Post-traumatic osteoarthritis of right foot 09/08/2016  . Endometriosis of pelvis 01/05/2013    Past Medical History:  Diagnosis Date  . Allergy   . Arthritis   . Asthma   . Constipation   . Endometriosis    Uses Nuvaring continuously  . Heart murmur   . Ulcer     Past Surgical History:  Procedure Laterality Date  . ANKLE SURGERY    . ANKLE SURGERY Right 06/11/2017  . APPENDECTOMY    . CESAREAN SECTION    . CHOLECYSTECTOMY    .  LAPAROSCOPIC ENDOMETRIOSIS FULGURATION    . LAPAROSCOPY     for endometriosis  . WISDOM TOOTH EXTRACTION     x2    Social History   Tobacco Use  . Smoking status: Never Smoker  . Smokeless tobacco: Never Used  Substance Use Topics  . Alcohol use: No    Alcohol/week: 0.0 oz     Current Outpatient Medications:  .  albuterol (PROVENTIL HFA;VENTOLIN HFA) 108 (90 Base) MCG/ACT inhaler, Inhale 2 puffs into the lungs every 6 (six) hours as needed for wheezing or shortness of breath., Disp: , Rfl:  .  cetirizine (ZYRTEC) 10 MG tablet, Take 10 mg by mouth daily., Disp: , Rfl:  .  levocetirizine (XYZAL) 5 MG tablet, Take 1 tablet (5 mg total) by mouth daily as needed for allergies., Disp: 30 tablet, Rfl: 11 .  NUVARING 0.12-0.015 MG/24HR vaginal ring, , Disp: , Rfl: 5 .  omeprazole (PRILOSEC) 20 MG capsule, Take 1 capsule (20 mg total) by mouth daily., Disp: 30 capsule, Rfl: 2 .  fluticasone (FLONASE) 50 MCG/ACT nasal spray, Place 2 sprays into both nostrils daily., Disp: 16 g, Rfl: 6 .  montelukast (SINGULAIR) 10 MG tablet, Take 1 tablet (10 mg total) by mouth at bedtime., Disp: 30 tablet, Rfl: 3 .  predniSONE (STERAPRED UNI-PAK 21  TAB) 5 MG (21) TBPK tablet, Take as directed, Disp: 21 tablet, Rfl: 0  No Known Allergies  ROS  No other specific complaints in a complete review of systems (except as listed in HPI above).  Objective  Vitals:   01/01/18 1506 01/01/18 1557  BP: (!) 152/86 (!) 144/72  Pulse: (!) 109 72  Resp: 16   Temp: 98.9 F (37.2 C)   TempSrc: Oral   SpO2: 98% 98%  Weight: 140 lb 11.2 oz (63.8 kg)   Height:  (1.6 m)     Body mass index is 24.92 kg/m.  Nursing Note and Vital Signs reviewed.  Physical Exam    Constitutional: Patient appears well-developed and well-nourished.  No distress.  HEENT: head atraumatic, normocephalic, pupils equal and reactive to light, TM's without erythema or bulging, no maxillary or frontal sinus tenderness , neck supple  without lymphadenopathy, oropharynx pink and moist without exudate, no nasal discharge Cardiovascular: normal rate, regular rhythm, S1/S2 present.  No murmur or rub heard.  Pulmonary/Chest: Effort normal and breath sounds clear. No respiratory distress or retractions. Skin: barely appreciable redness noted to bilateral forearms. No raised lesions or blistering noted, patient noticeably itching during exam Psychiatric: Patient has a normal mood and affect. behavior is normal. Judgment and thought content normal.  No results found for this or any previous visit (from the past 72 hour(s)).  Assessment & Plan  1. Allergic rhinitis, unspecified seasonality, unspecified trigger  - fluticasone (FLONASE) 50 MCG/ACT nasal spray; Place 2 sprays into both nostrils daily.  Dispense: 16 g; Refill: 6  2. Pruritic dermatitis  - predniSONE (STERAPRED UNI-PAK 21 TAB) 5 MG (21) TBPK tablet; Take as directed  Dispense: 21 tablet; Refill: 0  3. Multiple allergies  - montelukast (SINGULAIR) 10 MG tablet; Take 1 tablet (10 mg total) by mouth at bedtime.  Dispense: 30 tablet; Refill: 3  Informed to stop zyrtec since she is taking xyzal, try to identify at home triggers, if not improving follow up with allergist can refer if needed. Of note patient is hypertensive may be do to discomfort set up 3 month follow up with PCP, patient has physical scheduled in 2 weeks, will request repeat BP check in one week. Recommend avoiding decongestants  Avoid: phenylephrine, phenylpropanolamine, and pseudoephredine   -Red flags and when to present for emergency care or RTC including fever >101.39F, chest pain, shortness of breath, new/worsening/un-resolving symptoms, reviewed with patient at time of visit. Follow up and care instructions discussed and provided in AVS. -Reviewed Health Maintenance: mammogram number given  ------------------------------------------------ I have reviewed this encounter including the documentation  in this note and/or discussed this patient with the provider, Sharyon Cable DNP AGNP-C. I am certifying that I agree with the content of this note as supervising physician. Baruch Gouty, MD [facility] 01/05/2018, 1:28 PM

## 2018-01-19 ENCOUNTER — Ambulatory Visit: Payer: Self-pay | Admitting: Obstetrics & Gynecology

## 2018-02-15 ENCOUNTER — Telehealth: Payer: Self-pay | Admitting: Radiology

## 2018-02-15 NOTE — Telephone Encounter (Signed)
Called to inform patient that Dr Macon LargeAnyanwu may have Jury duty on 02/16/18, need to move appointment til later in the day with Erin FullingHarraway Smith for reschedule. Unable to leave voicemail due to mailbox being full.

## 2018-02-15 NOTE — Progress Notes (Deleted)
Lab 02/24/2017- abnormal hpv

## 2018-02-16 ENCOUNTER — Ambulatory Visit: Payer: Self-pay | Admitting: Obstetrics & Gynecology

## 2018-02-19 ENCOUNTER — Ambulatory Visit (INDEPENDENT_AMBULATORY_CARE_PROVIDER_SITE_OTHER): Payer: Managed Care, Other (non HMO) | Admitting: Podiatry

## 2018-02-19 ENCOUNTER — Encounter: Payer: Self-pay | Admitting: Podiatry

## 2018-02-19 ENCOUNTER — Ambulatory Visit (INDEPENDENT_AMBULATORY_CARE_PROVIDER_SITE_OTHER): Payer: Managed Care, Other (non HMO)

## 2018-02-19 DIAGNOSIS — M19171 Post-traumatic osteoarthritis, right ankle and foot: Secondary | ICD-10-CM

## 2018-02-19 DIAGNOSIS — Z981 Arthrodesis status: Secondary | ICD-10-CM

## 2018-02-22 NOTE — Progress Notes (Signed)
Subjective: [name] [name] is a 47 y.o. is seen today in office s/p right inferior heel spur resection and STJ arthrodesis preformed on 06/19/2017.  Overall she states that she is getting better.  She has good days and bad days.  She does feel that she is continue to make progress however.  She does with a Tri-Lock ankle brace but she does not feel that she actually needs it.  He has occasional swelling but overall no redness and the incisions continue to be well-healed and she has no new concerns.  No recent injury.  Denies any systemic complaints including fevers, chills, nausea, vomiting.  No calf pain, chest pain, shortness of breath.  Objective: General: No acute distress, AAOx3  Presents today in a cast DP/PT pulses palpable 2/4, CRT < 3 sec to all digits.  Protective sensation intact. Motor function intact.  Cam boot intact All the incisions are well-healed the scars overlying the area and scars are well formed.  There is no tenderness palpation to the surgical site.  There is mild tenderness to the lateral aspect of foot this is along with the peroneal tendon.  There is no pain on the medial ankle but there is some discomfort in the anterior lateral ankle joint as well.  Minimal swelling but there is no erythema there is no increase in warmth.  Is no clinical signs of infection.  Occasion associated with some discomfort on the medial band plantar fascia the arch of the foot. No open lesions or pre-ulcerative lesions. No pain with calf compression, swelling, warmth, erythema.  Assessment and Plan:  Status post right foot subtalar joint fusion, improving  -Treatment options discussed including all alternatives, risks, and complications -X-rays were obtained and reviewed.  Hardware intact.  Consolidation of the posterior facet of the subtalar joint. -At this point continue with range of motion, rehab exercises at home and I want her to start to not use the ankle brace as much.  I will get her  scheduled to [place] Raif[place] of orthotics.  I think it more of a deeper heel cup with good arch support.  Gradually increase activity level.  She is been to increase her activity level at work as well which we discussed today.  Vivi BarrackMatthew R Wagoner DPM

## 2018-02-25 ENCOUNTER — Ambulatory Visit (INDEPENDENT_AMBULATORY_CARE_PROVIDER_SITE_OTHER): Payer: Managed Care, Other (non HMO) | Admitting: Obstetrics & Gynecology

## 2018-02-25 ENCOUNTER — Encounter: Payer: Self-pay | Admitting: Obstetrics & Gynecology

## 2018-02-25 VITALS — BP 104/72 | HR 72 | Wt 143.1 lb

## 2018-02-25 DIAGNOSIS — R87810 Cervical high risk human papillomavirus (HPV) DNA test positive: Secondary | ICD-10-CM

## 2018-02-25 DIAGNOSIS — Z1151 Encounter for screening for human papillomavirus (HPV): Secondary | ICD-10-CM

## 2018-02-25 DIAGNOSIS — Z124 Encounter for screening for malignant neoplasm of cervix: Secondary | ICD-10-CM | POA: Diagnosis not present

## 2018-02-25 DIAGNOSIS — Z113 Encounter for screening for infections with a predominantly sexual mode of transmission: Secondary | ICD-10-CM

## 2018-02-25 DIAGNOSIS — Z01419 Encounter for gynecological examination (general) (routine) without abnormal findings: Secondary | ICD-10-CM

## 2018-02-25 DIAGNOSIS — R350 Frequency of micturition: Secondary | ICD-10-CM | POA: Diagnosis not present

## 2018-02-25 DIAGNOSIS — IMO0002 Reserved for concepts with insufficient information to code with codable children: Secondary | ICD-10-CM

## 2018-02-25 DIAGNOSIS — N803 Endometriosis of pelvic peritoneum: Secondary | ICD-10-CM

## 2018-02-25 LAB — POCT URINE QUALITATIVE DIPSTICK BLOOD: Blood, UA: POSITIVE

## 2018-02-25 MED ORDER — NUVARING 0.12-0.015 MG/24HR VA RING: 1.0000 | VAGINAL_RING | VAGINAL | 6 refills | Status: DC

## 2018-02-25 MED ORDER — FLUCONAZOLE 150 MG PO TABS: 150.0000 mg | ORAL_TABLET | Freq: Once | ORAL | 3 refills | Status: AC

## 2018-02-25 MED ORDER — CIPROFLOXACIN HCL 500 MG PO TABS: 500.0000 mg | ORAL_TABLET | Freq: Two times a day (BID) | ORAL | 0 refills | Status: DC

## 2018-02-25 NOTE — Progress Notes (Signed)
Patient was positive for blood will send for a urine culture.

## 2018-02-25 NOTE — Patient Instructions (Signed)
Preventive Care 40-64 Years, Female Preventive care refers to lifestyle choices and visits with your health care provider that can promote health and wellness. What does preventive care include?  A yearly physical exam. This is also called an annual well check.  Dental exams once or twice a year.  Routine eye exams. Ask your health care provider how often you should have your eyes checked.  Personal lifestyle choices, including: ? Daily care of your teeth and gums. ? Regular physical activity. ? Eating a healthy diet. ? Avoiding tobacco and drug use. ? Limiting alcohol use. ? Practicing safe sex. ? Taking low-dose aspirin daily starting at age 58. ? Taking vitamin and mineral supplements as recommended by your health care provider. What happens during an annual well check? The services and screenings done by your health care provider during your annual well check will depend on your age, overall health, lifestyle risk factors, and family history of disease. Counseling Your health care provider may ask you questions about your:  Alcohol use.  Tobacco use.  Drug use.  Emotional well-being.  Home and relationship well-being.  Sexual activity.  Eating habits.  Work and work Statistician.  Method of birth control.  Menstrual cycle.  Pregnancy history.  Screening You may have the following tests or measurements:  Height, weight, and BMI.  Blood pressure.  Lipid and cholesterol levels. These may be checked every 5 years, or more frequently if you are over 81 years old.  Skin check.  Lung cancer screening. You may have this screening every year starting at age 78 if you have a 30-pack-year history of smoking and currently smoke or have quit within the past 15 years.  Fecal occult blood test (FOBT) of the stool. You may have this test every year starting at age 65.  Flexible sigmoidoscopy or colonoscopy. You may have a sigmoidoscopy every 5 years or a colonoscopy  every 10 years starting at age 30.  Hepatitis C blood test.  Hepatitis B blood test.  Sexually transmitted disease (STD) testing.  Diabetes screening. This is done by checking your blood sugar (glucose) after you have not eaten for a while (fasting). You may have this done every 1-3 years.  Mammogram. This may be done every 1-2 years. Talk to your health care provider about when you should start having regular mammograms. This may depend on whether you have a family history of breast cancer.  BRCA-related cancer screening. This may be done if you have a family history of breast, ovarian, tubal, or peritoneal cancers.  Pelvic exam and Pap test. This may be done every 3 years starting at age 80. Starting at age 36, this may be done every 5 years if you have a Pap test in combination with an HPV test.  Bone density scan. This is done to screen for osteoporosis. You may have this scan if you are at high risk for osteoporosis.  Discuss your test results, treatment options, and if necessary, the need for more tests with your health care provider. Vaccines Your health care provider may recommend certain vaccines, such as:  Influenza vaccine. This is recommended every year.  Tetanus, diphtheria, and acellular pertussis (Tdap, Td) vaccine. You may need a Td booster every 10 years.  Varicella vaccine. You may need this if you have not been vaccinated.  Zoster vaccine. You may need this after age 5.  Measles, mumps, and rubella (MMR) vaccine. You may need at least one dose of MMR if you were born in  1957 or later. You may also need a second dose.  Pneumococcal 13-valent conjugate (PCV13) vaccine. You may need this if you have certain conditions and were not previously vaccinated.  Pneumococcal polysaccharide (PPSV23) vaccine. You may need one or two doses if you smoke cigarettes or if you have certain conditions.  Meningococcal vaccine. You may need this if you have certain  conditions.  Hepatitis A vaccine. You may need this if you have certain conditions or if you travel or work in places where you may be exposed to hepatitis A.  Hepatitis B vaccine. You may need this if you have certain conditions or if you travel or work in places where you may be exposed to hepatitis B.  Haemophilus influenzae type b (Hib) vaccine. You may need this if you have certain conditions.  Talk to your health care provider about which screenings and vaccines you need and how often you need them. This information is not intended to replace advice given to you by your health care provider. Make sure you discuss any questions you have with your health care provider. Document Released: 08/24/2015 Document Revised: 04/16/2016 Document Reviewed: 05/29/2015 Elsevier Interactive Patient Education  2018 Elsevier Inc.  

## 2018-02-25 NOTE — Progress Notes (Signed)
GYNECOLOGY ANNUAL PREVENTATIVE CARE ENCOUNTER NOTE  Subjective:   [name] [name] is a 47 y.o. G1P1 female here for a routine annual gynecologic exam.  Current complaints: increased urinary frequency for a few days.  No dysuria, fevers, chills, N/V. Had endometriosis, treated with continuous Nuvaring. Denies abnormal vaginal bleeding, discharge, pelvic pain, problems with intercourse or other gynecologic concerns.    Gynecologic History No LMP recorded. (Menstrual status: Other). Contraception: NuvaRing vaginal inserts Last Pap: 02/24/2017. Results were: normal with positive high-risk HPV 18/46, benign colposcopy. Last mammogram: 09/19/2016. Results were: normal  Obstetric History OB History  Gravida Para Term Preterm AB Living  1 1       1   SAB TAB Ectopic Multiple Live Births          1    # Outcome Date GA Lbr Len/2nd Weight Sex Delivery Anes PTL Lv  1 Para 1995    M CS-LTranv   LIV    Past Medical History:  Diagnosis Date  . Allergy   . Arthritis   . Asthma   . Constipation   . Endometriosis    Uses Nuvaring continuously  . Heart murmur   . Ulcer     Past Surgical History:  Procedure Laterality Date  . ANKLE SURGERY    . ANKLE SURGERY Right 06/11/2017  . APPENDECTOMY    . CESAREAN SECTION    . CHOLECYSTECTOMY    . LAPAROSCOPIC ENDOMETRIOSIS FULGURATION    . LAPAROSCOPY     for endometriosis  . WISDOM TOOTH EXTRACTION     x2    Current Outpatient Medications on File Prior to Visit  Medication Sig Dispense Refill  . albuterol (PROVENTIL HFA;VENTOLIN HFA) 108 (90 Base) MCG/ACT inhaler Inhale 2 puffs into the lungs every 6 (six) hours as needed for wheezing or shortness of breath.    . fluticasone (FLONASE) 50 MCG/ACT nasal spray Place 2 sprays into both nostrils daily. 16 g 6  . levocetirizine (XYZAL) 5 MG tablet Take 1 tablet (5 mg total) by mouth daily as needed for allergies. 30 tablet 11  . montelukast (SINGULAIR) 10 MG tablet Take 1 tablet (10 mg total) by  mouth at bedtime. 30 tablet 3  . omeprazole (PRILOSEC) 20 MG capsule Take 1 capsule (20 mg total) by mouth daily. 30 capsule 2  . cetirizine (ZYRTEC) 10 MG tablet Take 10 mg by mouth daily.     No current facility-administered medications on file prior to visit.     No Known Allergies  Social History:  reports that she has never smoked. She has never used smokeless tobacco. She reports that she does not drink alcohol or use drugs.  Family History  Problem Relation Age of Onset  . COPD Mother   . Emphysema Mother   . Cancer Father        unknown  . Cancer Paternal Aunt        ovarian    The following portions of the patient's history were reviewed and updated as appropriate: allergies, current medications, past family history, past medical history, past social history, past surgical history and problem list.  Review of Systems Pertinent items noted in HPI and remainder of comprehensive ROS otherwise negative.   Objective:  BP 104/72   Pulse 72   Wt 143 lb 1.6 oz (64.9 kg)   BMI 25.35 kg/m  CONSTITUTIONAL: Well-developed, well-nourished female in no acute distress.  HENT:  Normocephalic, atraumatic, External right and left ear normal. Oropharynx is clear  and moist EYES: Conjunctivae and EOM are normal. Pupils are equal, round, and reactive to light. No scleral icterus.  NECK: Normal range of motion, supple, no masses.  Normal thyroid.  SKIN: Skin is warm and dry. No rash noted. Not diaphoretic. No erythema. No pallor. MUSCULOSKELETAL: Normal range of motion. No tenderness.  No cyanosis, clubbing, or edema.  2+ distal pulses. NEUROLOGIC: Alert and oriented to person, place, and time. Normal reflexes, muscle tone coordination. No cranial nerve deficit noted. PSYCHIATRIC: Normal mood and affect. Normal behavior. Normal judgment and thought content. CARDIOVASCULAR: Normal heart rate noted, regular rhythm RESPIRATORY: Clear to auscultation bilaterally. Effort and breath sounds  normal, no problems with respiration noted. BREASTS: Symmetric in size. No masses, skin changes, nipple drainage, or lymphadenopathy. ABDOMEN: Soft, normal bowel sounds, no distention noted.  No tenderness, rebound or guarding.  PELVIC: Normal appearing external genitalia; normal appearing vaginal mucosa and cervix.  No abnormal discharge noted.  Pap smear obtained.  Normal uterine size, no other palpable masses, no uterine or adnexal tenderness.  Results for orders placed or performed in visit on 02/25/18 (from the past 48 hour(s))  POCT urine qual dipstick blood     Status: None   Collection Time: 02/25/18  9:45 AM  Result Value Ref Range   Blood, UA positive   Culture pending.  Assessment and Plan:  1. Well woman exam with routine gynecological exam 2. Cervical high risk HPV (human papillomavirus) test positive - Cytology - PAP - CBC - TSH - Hemoglobin A1c - Lipid panel - Comprehensive metabolic panel - Hepatitis B surface antigen - Hepatitis C antibody - RPR - HIV antibody - POCT urine qual dipstick blood Will follow up results of pap smear and manage accordingly.  3. Increased urinary frequency Presumptively treated. - Urine Culture - ciprofloxacin (CIPRO) 500 MG tablet; Take 1 tablet (500 mg total) by mouth 2 (two) times daily.  Dispense: 6 tablet; Refill: 0 - fluconazole (DIFLUCAN) 150 MG tablet; Take 1 tablet (150 mg total) by mouth once for 1 dose. Can take additional dose three days later if symptoms persist  Dispense: 1 tablet; Refill: 3  4. Endometriosis of pelvis - NUVARING 0.12-0.015 MG/24HR vaginal ring; Place 1 each vaginally every 28 (twenty-eight) days. Use in continuous fashion for endometriosis  Dispense: 3 each; Refill: 6  Mammogram is up-to-date, will repeat next year. Routine preventative health maintenance measures emphasized. Please refer to After Visit Summary for other counseling recommendations.    Jaynie Collins, MD, FACOG Obstetrician &  Gynecologist, [facility] for [employer], [facility]

## 2018-02-26 LAB — COMPREHENSIVE METABOLIC PANEL
A/G RATIO: 1.6 (ref 1.2–2.2)
ALK PHOS: 74 IU/L (ref 39–117)
ALT: 7 IU/L (ref 0–32)
AST: 11 IU/L (ref 0–40)
Albumin: 4 g/dL (ref 3.5–5.5)
BUN/Creatinine Ratio: 15 (ref 9–23)
BUN: 10 mg/dL (ref 6–24)
Bilirubin Total: 0.4 mg/dL (ref 0.0–1.2)
CO2: 21 mmol/L (ref 20–29)
Calcium: 8.7 mg/dL (ref 8.7–10.2)
Chloride: 108 mmol/L — ABNORMAL HIGH (ref 96–106)
Creatinine, Ser: 0.66 mg/dL (ref 0.57–1.00)
GFR calc Af Amer: 122 mL/min/{1.73_m2} (ref >59)
GFR, EST NON AFRICAN AMERICAN: 106 mL/min/{1.73_m2} (ref >59)
GLOBULIN, TOTAL: 2.5 g/dL (ref 1.5–4.5)
Glucose: 79 mg/dL (ref 65–99)
POTASSIUM: 4.2 mmol/L (ref 3.5–5.2)
SODIUM: 143 mmol/L (ref 134–144)
Total Protein: 6.5 g/dL (ref 6.0–8.5)

## 2018-02-26 LAB — URINE CULTURE: Organism ID, Bacteria: NO GROWTH

## 2018-02-26 LAB — LIPID PANEL
CHOLESTEROL TOTAL: 155 mg/dL (ref 100–199)
Chol/HDL Ratio: 2.5 ratio (ref 0.0–4.4)
HDL: 61 mg/dL (ref >39)
LDL Calculated: 73 mg/dL (ref 0–99)
TRIGLYCERIDES: 106 mg/dL (ref 0–149)
VLDL CHOLESTEROL CAL: 21 mg/dL (ref 5–40)

## 2018-02-26 LAB — CBC
HEMOGLOBIN: 13.9 g/dL (ref 11.1–15.9)
Hematocrit: 42.3 % (ref 34.0–46.6)
MCH: 27.9 pg (ref 26.6–33.0)
MCHC: 32.9 g/dL (ref 31.5–35.7)
MCV: 85 fL (ref 79–97)
Platelets: 300 10*3/uL (ref 150–450)
RBC: 4.98 x10E6/uL (ref 3.77–5.28)
RDW: 13.1 % (ref 12.3–15.4)
WBC: 7.4 10*3/uL (ref 3.4–10.8)

## 2018-02-26 LAB — HEPATITIS C ANTIBODY

## 2018-02-26 LAB — RPR: RPR Ser Ql: NONREACTIVE

## 2018-02-26 LAB — HEMOGLOBIN A1C
Est. average glucose Bld gHb Est-mCnc: 105 mg/dL
Hgb A1c MFr Bld: 5.3 % (ref 4.8–5.6)

## 2018-02-26 LAB — TSH: TSH: 2.05 u[IU]/mL (ref 0.450–4.500)

## 2018-02-26 LAB — HEPATITIS B SURFACE ANTIGEN: HEP B S AG: NEGATIVE

## 2018-02-26 LAB — HIV ANTIBODY (ROUTINE TESTING W REFLEX): HIV SCREEN 4TH GENERATION: NONREACTIVE

## 2018-03-01 LAB — CYTOLOGY - PAP
BACTERIAL VAGINITIS: NEGATIVE
CANDIDA VAGINITIS: NEGATIVE
Chlamydia: NEGATIVE
Diagnosis: NEGATIVE
HPV (WINDOPATH): DETECTED — AB
HPV 16/18/45 genotyping: POSITIVE — AB
Neisseria Gonorrhea: NEGATIVE
TRICH (WINDOWPATH): NEGATIVE

## 2018-03-03 ENCOUNTER — Telehealth: Payer: Self-pay

## 2018-03-03 ENCOUNTER — Ambulatory Visit (INDEPENDENT_AMBULATORY_CARE_PROVIDER_SITE_OTHER): Payer: Self-pay | Admitting: Orthotics

## 2018-03-03 ENCOUNTER — Encounter: Payer: Self-pay | Admitting: Obstetrics & Gynecology

## 2018-03-03 DIAGNOSIS — M19171 Post-traumatic osteoarthritis, right ankle and foot: Secondary | ICD-10-CM

## 2018-03-03 DIAGNOSIS — Z981 Arthrodesis status: Secondary | ICD-10-CM

## 2018-03-03 NOTE — Telephone Encounter (Signed)
Pt returned call for lab results.

## 2018-03-03 NOTE — Telephone Encounter (Signed)
Called patient to inform her of test results. [email or web address]

## 2018-03-03 NOTE — Progress Notes (Signed)
Patient came into today to be cast for [employer]. Upon recommendation of Dr. Ardelle AntonWagoner Patient presents with post sx arthrodesis, Right, RF varus RT Goals are ankle stability w/ f/o deep heel seat, M/L flange, 4* RF valgus posting Plan vendor [email or web address]

## 2018-03-15 ENCOUNTER — Ambulatory Visit: Payer: Self-pay | Admitting: Family Medicine

## 2018-03-15 ENCOUNTER — Ambulatory Visit
Admission: RE | Admit: 2018-03-15 | Discharge: 2018-03-15 | Disposition: A | Discharge status: Home / Self Care | Payer: Managed Care, Other (non HMO) | Source: Ambulatory Visit | Attending: Family Medicine | Admitting: Family Medicine

## 2018-03-15 VITALS — BP 135/79 | HR 66 | Resp 16

## 2018-03-15 DIAGNOSIS — M542 Cervicalgia: Secondary | ICD-10-CM | POA: Diagnosis not present

## 2018-03-15 DIAGNOSIS — M47812 Spondylosis without myelopathy or radiculopathy, cervical region: Secondary | ICD-10-CM | POA: Diagnosis not present

## 2018-03-15 DIAGNOSIS — M546 Pain in thoracic spine: Secondary | ICD-10-CM

## 2018-03-15 MED ORDER — CYCLOBENZAPRINE HCL 5 MG PO TABS: 5.0000 mg | ORAL_TABLET | Freq: Three times a day (TID) | ORAL | 0 refills | Status: DC | PRN

## 2018-03-15 MED ORDER — KETOROLAC TROMETHAMINE 30 MG/ML IJ SOLN
30.0000 mg | Freq: Once | INTRAMUSCULAR | Status: AC
  Administered 2018-03-15: 30 mg via INTRAMUSCULAR

## 2018-03-15 NOTE — Progress Notes (Signed)
Subjective: back pain     [name] [name] is a 47 y.o. female who presents for evaluation of neck and mid back pain. The patient has had no prior back problems. Symptoms have been present for a few hours and are unchanged.  Onset was related to / precipitated by bending down to pull down her pants to use the restroom. The pain is located in the interscapular area, base of her neck, and right thoracic paraspinal muscles and radiates across the upper back. The pain is described as constant and worse with walking, movement, and sitting. Factors which relieve the pain include rest with and without relief.  The symptoms have not been progressive.  Denies attempting any other treatment. She has no other symptoms associated with the back pain. Previous history of symptoms: never.  Patient denies numbness, tingling, spasticity, weakness, paralysis, difficulty walking, neurologic symptoms, saddle anesthesia, or bowel/bladder changes or retention.  The patient has no "red flag" history indicative of complicated back pain.  Denies previous injury, trauma, or surgery to neck or spine.  Denies any history of osteopenia, osteoporosis, or pathologic fracture.  Denies any other relevant history.  Review of Systems Pertinent items noted in HPI and remainder of comprehensive ROS otherwise negative.    Objective:  Spine: Range of motion limited due to pain.  Spasm noted to bilateral thoracic paraspinal muscles, right side greater than left.  No deformity noted.  No erythema, edema, warmth to touch or ecchymosis.  No neurovascular abnormality.  Generalized TTP to light touch to bilateral upper back and base of neck.  Normal curvature and alignment, symmetrical.  Normal strength and motor function.  Unable to perform straight leg raise due to pain.  Patient able to ambulate and get on and off exam table without any assistance or difficulty.  No TTP to the low back. General appearance: alert, cooperative, appears stated age and  no distress Neck: supple, symmetrical, trachea midline Extremities: extremities normal, atraumatic, no cyanosis or edema Pulses: 2+ and symmetric Skin: Skin color, texture, turgor normal. No rashes or lesions Neurologic: Alert and oriented X 3, normal strength and tone. Normal symmetric reflexes. Normal coordination and gait   Diagnostic Results:  EXAM: CERVICAL SPINE - COMPLETE 4+ VIEW  COMPARISON:  None.  FINDINGS: There is no evidence of cervical spine fracture or prevertebral soft tissue swelling. Alignment is normal. No other significant bone abnormalities are identified. Mild disc space narrowing at C4-5, C5-6, and C6-7. There is BILATERAL foraminal narrowing at those 3 levels, most severe at C4-5 on the RIGHT. Lung apices are clear. Negative odontoid.  IMPRESSION: Cervical spondylosis, C4-C7.  No acute findings.   Electronically Signed   By: Elsie Stain M.D.   On: 03/15/2018 15:15  EXAM: THORACIC SPINE - 3 VIEWS  COMPARISON:  None.  FINDINGS: There is no evidence of thoracic spine fracture. Alignment is normal. No other significant bone abnormalities are identified.  IMPRESSION: Negative.   Electronically Signed   By: Elsie Stain M.D.   On: 03/15/2018 15:13  Assessment:   Acute thoracic paraspinal muscle strain Cervical spondylosis   Plan:    Natural history and expected course discussed. Questions answered. Short (2-4 day) period of relative rest recommended until acute symptoms improve.   Discussed x-ray findings with patient. Provided patient with ThermaCare heat wrap and discussed ice/heat application. 30 mg Toradol IM given in the office.  Patient observed following injection, no adverse reaction observed.  Denies recent NSAID use.  Advised patient not to take  NSAIDs concurrently.  Denies any contraindications to Toradol.  Discussed side/adverse effects. Prescribed Flexeril to use as needed and discussed side/adverse effects and  precautions with patient.  Advised patient to f/u with PCP in 2-3 days for reevaluation. Red flag symptoms and indications to seek medical care discussed.

## 2018-03-18 ENCOUNTER — Encounter: Payer: Self-pay | Admitting: Obstetrics & Gynecology

## 2018-03-18 ENCOUNTER — Ambulatory Visit (INDEPENDENT_AMBULATORY_CARE_PROVIDER_SITE_OTHER): Payer: Managed Care, Other (non HMO) | Admitting: Obstetrics & Gynecology

## 2018-03-18 VITALS — BP 116/76 | HR 81 | Resp 16 | Ht 63.0 in | Wt 145.0 lb

## 2018-03-18 DIAGNOSIS — R87810 Cervical high risk human papillomavirus (HPV) DNA test positive: Secondary | ICD-10-CM

## 2018-03-18 DIAGNOSIS — Z3202 Encounter for pregnancy test, result negative: Secondary | ICD-10-CM

## 2018-03-18 LAB — POCT URINE PREGNANCY: PREG TEST UR: NEGATIVE

## 2018-03-18 NOTE — Addendum Note (Signed)
Addended by: Jaynie CollinsANYANWU, UGONNA A on: 03/18/2018 12:02 PM   Modules accepted: Orders

## 2018-03-18 NOTE — Progress Notes (Signed)
    GYNECOLOGY CLINIC COLPOSCOPY PROCEDURE NOTE  47 y.o. 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  Same pap smear result in  02/24/2017 and 02/15/2016, benign subsequent colposcopies. Discussed role for HPV in cervical dysplasia, especially the concern about 18/45, need for surveillance.  Patient given informed consent, signed copy in the chart, time out was performed.  Placed in lithotomy position. Cervix viewed with speculum and colposcope after application of acetic acid.   Colposcopy adequate? Yes No visible lesions; corresponding biopsies obtained.  ECC specimen obtained, labeled and sent to pathology.   Patient was given post procedure instructions.  Will follow up pathology and manage accordingly; patient will be contacted with results and recommendations.  Routine preventative health maintenance measures emphasized.    Jaynie CollinsUGONNA  Drayke Grabel, MD, FACOG Obstetrician & Gynecologist, [facility], [facility]

## 2018-03-18 NOTE — Patient Instructions (Signed)

## 2018-03-24 ENCOUNTER — Other Ambulatory Visit: Payer: Managed Care, Other (non HMO) | Admitting: Orthotics

## 2018-03-31 ENCOUNTER — Ambulatory Visit: Payer: Managed Care, Other (non HMO) | Admitting: Orthotics

## 2018-03-31 DIAGNOSIS — M722 Plantar fascial fibromatosis: Secondary | ICD-10-CM

## 2018-03-31 DIAGNOSIS — Z981 Arthrodesis status: Secondary | ICD-10-CM

## 2018-03-31 NOTE — Progress Notes (Signed)
Patient came in today to pick up custom made foot orthotics.  The goals were accomplished and the patient reported no dissatisfaction with said orthotics.  Patient was advised of breakin period and how to report any issues. 

## 2018-04-05 ENCOUNTER — Ambulatory Visit: Payer: Managed Care, Other (non HMO) | Admitting: Family Medicine

## 2018-04-19 ENCOUNTER — Ambulatory Visit: Payer: Self-pay | Admitting: Family Medicine

## 2018-04-19 VITALS — BP 135/77 | HR 77 | Temp 98.4°F | Resp 16

## 2018-04-19 DIAGNOSIS — R350 Frequency of micturition: Secondary | ICD-10-CM

## 2018-04-19 DIAGNOSIS — B3731 Acute candidiasis of vulva and vagina: Secondary | ICD-10-CM

## 2018-04-19 DIAGNOSIS — B373 Candidiasis of vulva and vagina: Secondary | ICD-10-CM

## 2018-04-19 DIAGNOSIS — J4520 Mild intermittent asthma, uncomplicated: Secondary | ICD-10-CM

## 2018-04-19 DIAGNOSIS — B370 Candidal stomatitis: Secondary | ICD-10-CM

## 2018-04-19 LAB — POCT URINALYSIS DIPSTICK
Bilirubin, UA: NEGATIVE
Glucose, UA: NEGATIVE
KETONES UA: NEGATIVE
LEUKOCYTES UA: NEGATIVE
NITRITE UA: NEGATIVE
PH UA: 7 (ref 5.0–8.0)
PROTEIN UA: NEGATIVE
RBC UA: NEGATIVE
SPEC GRAV UA: 1.02 (ref 1.010–1.025)
UROBILINOGEN UA: 0.2 U/dL

## 2018-04-19 MED ORDER — CLOTRIMAZOLE 10 MG MT TROC: OROMUCOSAL | 0 refills | Status: DC

## 2018-04-19 MED ORDER — FLUCONAZOLE 150 MG PO TABS: ORAL_TABLET | ORAL | 0 refills | Status: DC

## 2018-04-19 MED ORDER — ALBUTEROL SULFATE HFA 108 (90 BASE) MCG/ACT IN AERS: 2.0000 | INHALATION_SPRAY | Freq: Four times a day (QID) | RESPIRATORY_TRACT | 0 refills | Status: DC | PRN

## 2018-04-20 NOTE — Progress Notes (Signed)
Subjective: back pain     [name] [name] is a 47 y.o. female who presents for evaluation of dry mouth and "white spots my mouth".  Patient reports the symptoms began 1 week ago and have improved significantly with the use of [employer] mouth rinse with hydrogen peroxide 3 times daily for the last week.  Patient reports that the inside of her mouth felt rough and sore a few days ago but that this has resolved.  Patient denies any changes in her oral hygiene habits or products, sick contacts, changes in/new medications, taking any immunosuppressive meds, steroid use, steroid inhaler use, history of HIV or immunosuppression conditions, history of diabetes, or recent antibiotic use.  Patient reports that at her last physical she was tested for diabetes and HIV, both of which were negative.  On 02/25/2018 her HIV antibody was nonreactive and hemoglobin A1c was 5.3%.  Patient denies any possible exposure to HIV since then or risk factors for HIV.  Patient denies any history of STI or concern for STI and declines testing for this or repeat testing for HIV today.  Additional symptoms reported by the patient include hoarse voice, mild sore throat, and clear rhinorrhea. Patient reports being completely asymptomatic before the onset of the symptoms. Denies odynophagia or dysphagia.  Patient reports a history of allergic rhinitis and reports symptom usually increase around this time year.  Patient uses Xyzal and Flonase as needed for this.  Patient denies using Flonase daily as prescribed.  Patient also complains of left ear fullness and pain at the submandibular angle on the left side for the last week.  Denies otorrhea or pain.  Denies fevers, chills, malaise, fatigue, any other systemic symptoms. Denies any significant history of ear problems or surgeries in the past.  Patient denies increased use of her albuterol inhaler. Patient uses this less than 2 times a month.  When inquiring about other sources of candidal infection  patient reports "I also have a vaginal yeast infection".  Patient reports she gets these a few times a year and that this feels like one.  Reports symptoms of thick white vaginal discharge.  Denies any other symptoms.  Patient reports that she usually has to have 2 doses of Diflucan to clear this infection.  Patient also complains of urinary frequency when asked directly, unsure onset.  Patient denies flank pain, suprapubic pressure, hematuria, dysuria, odor to urine or vaginal discharge, nausea, vomiting, changes in bowel patterns,  Cough, headache, or abdominal pain. Patient does not have a history of recurrent UTI.  Patient does not have a history of pyelonephritis.  Denies any other relevant history.  Patient also incidentally endorses low back pain when inquiring about flank pain related to urinary frequency.  Patient reports "tender muscles in my low back since I started new exercises a few days ago".  Reports pain coincides with these new exercises and that she is not very concerned about this.  The patient has had no prior back problems.  Denies any history of injury, trauma, or surgery.  The pain is located in the lumbar paraspinal muscles bilaterally and does not radiate. The pain is described as soreness and occurs intermittently.  Patient only notices pain with palpation and reports is very mild.  Otherwise denies any alleviating or exacerbating factors.  No pain at rest.  Reports normal range of motion, function, strength, and sensation.  The symptoms have not been progressive.  Denies any attempted treatment at home.  Denies numbness, tingling, weakness, spasticity, bowel/bladder  retention or dysfunction, saddle anesthesia, or any other symptoms at all.    The patient has no "red flag" history indicative of complicated back pain.  Denies any other symptoms or complaints  Review of Systems Pertinent items noted in HPI and remainder of comprehensive ROS otherwise negative.    Objective:   Physical Exam General: Awake, alert, and oriented. No acute distress. Well developed, hydrated and nourished. Appears stated age. Nontoxic appearance.  HEENT: Clear PND noted.  No erythema to posterior oropharynx. No edema or exudates of pharynx or tonsils. No erythema, opaque fluid, or bulging of TM.  Retraction noted to left tympanic membrane.  Right tympanic membrane normal. Pale, boggy nasal mucosa bilaterally. Sinuses nontender. Supple neck without adenopathy.  White film along surface of tongue that easily wipes away.  No other lesions around or inside the mouth. Cardiac: Heart rate and rhythm are normal. No murmurs, gallops, or rubs are auscultated. S1 and S2 are heard and are of normal intensity.  Respiratory: No signs of respiratory distress. Lungs clear. No tachypnea. Able to speak in full sentences without dyspnea. Nonlabored respirations.   Skin: Skin is warm, dry and intact. Appropriate color for ethnicity. No cyanosis noted.  No lesions or rashes noted. Back: Full range of motion without pain, no spasm, no curvature.  Very mild tenderness to palpation of bilateral lumbar paraspinal muscles.  Otherwise no TTP. Normal reflexes, gait, strength and negative straight-leg raise.  No CVA tenderness bilaterally. Abdomen: soft, non-tender, without masses or organomegaly  GU: defer exam - unable to perform in clinic   Laboratory:  Urine dipstick shows negative for all components.    Assessment:   Acute low back pain-likely muscle soreness as a result of starting new exercises Urinary frequency Oral candidiasis History of Candida vulvovaginitis Eustachian tube dysfunction   Plan:    Natural history and expected course discussed. Questions answered. Stretching exercises discussed.   Provided patient with a ThermaCare heat wrap and discussed appropriate use of ice and heat. OTC analgesics as needed. Discussed OTC treatments, such as warm salt water rinse. Patient is already prescribed  Flonase, discussed using it as prescribed to obtain better control of her allergies and eustachian tube dysfunction.  Prescribed Diflucan and advised patient that if this does not improve her GU symptoms that she needs to follow-up with her primary care provider for further evaluation. Prescribed clotrimazole troches.  Prescribed a new albuterol inhaler for the patient and educated her to dispose of her old inhaler as soon as she picks up her new one from the pharmacy. Consulted Dr. Sullivan Lone regarding the care of this patient. Discussed side/adverse effects of all medications prescribed. Advised patient to follow-up with her primary care provider within the next week for further evaluation of the cause of her recurrent Candida infections and for follow-up of all of her issues.  Advised to follow-up sooner if needed. Discussed red flag symptoms and indications to seek medical care.  New Prescriptions   CLOTRIMAZOLE (MYCELEX) 10 MG TROCHE    Completely dissolve one 10 mg troche in mouth five times daily for 14 days.   FLUCONAZOLE (DIFLUCAN) 150 MG TABLET    Take one 150 mg tablet by mouth followed by another 150 mg tablet by mouth 72 hours later.

## 2018-04-23 ENCOUNTER — Ambulatory Visit: Payer: Managed Care, Other (non HMO) | Admitting: Podiatry

## 2018-04-30 ENCOUNTER — Ambulatory Visit: Payer: Managed Care, Other (non HMO) | Admitting: Podiatry

## 2018-05-05 ENCOUNTER — Ambulatory Visit: Payer: Self-pay | Admitting: Family Medicine

## 2018-05-05 VITALS — BP 112/78 | HR 89 | Temp 98.8°F | Resp 16

## 2018-05-05 DIAGNOSIS — L299 Pruritus, unspecified: Secondary | ICD-10-CM

## 2018-05-05 DIAGNOSIS — R238 Other skin changes: Secondary | ICD-10-CM

## 2018-05-05 MED ORDER — MOMETASONE FUROATE 0.1 % EX CREA: 1.0000 "application " | TOPICAL_CREAM | Freq: Every day | CUTANEOUS | 0 refills | Status: DC

## 2018-05-05 NOTE — Progress Notes (Signed)
Subjective: Rash     [name] [name] is a 47 y.o. female who presents for evaluation of a rash involving the left lateral neck and right distal thigh that she first noticed around 3 am the morning of 05/05/18. Lesions are pink, and raised in texture. Rash has not changed over time. Rash is pruritic. Associated symptoms: none. Patient denies: abdominal pain, arthralgia, congestion, cough, decrease in appetite, decrease in energy level, fever, headache, irritability, myalgia, nausea, sore throat, or vomiting. Patient has not had new exposures (soaps, lotions, laundry detergents, foods, medications, plants, insects or animals).  Patient did begin taking clotrimazole troches on 04/19/2018 but has never had a reaction to these or similar medications in the past.  Denies intraoral, perioral, facial swelling, or swelling of the airways.  Denies shortness of breath, chest tightness, or cough.  Patient reports symptoms coincide with staying at her mom's house last night but reports that she checked her mom for similar symptoms and she had none.  Denies any other household contacts or contacts with similar symptoms.  Denies relevant travel.  Patient has a history of pruritus and localized edema with mosquito bites in the past and reports her dermatologist told her she was allergic to these.  Denies tick exposure.  Denies any systemic symptoms at all.  Patient has a dermatologist that she sees regularly for routine care.  Review of Systems Pertinent items noted in HPI and remainder of comprehensive ROS otherwise negative.    Objective:    BP 112/78 (BP Location: Left Arm, Patient Position: Sitting, Cuff Size: Normal)   Pulse 89   Temp 98.8 F (37.1 C) (Oral)   Resp 16   SpO2 98%  General:  alert, cooperative, appears stated age and no distress  Skin:   small cluster of 5 pink papules to left lateral neck inferior to the ear and a cluster of 3 pink papules to right distal lateral thigh. Both clusters have  approximately 1 cm of surrounding erythema.  No joint erythema or edema noted.  Patient also has a small amount of faint erythema under her right breast along the bra line, unsure how long this has been present for but is not painful or itchy.  No lesions, edema, warmth to the touch, or any other abnormal findings at this site.  No warmth to the touch.  No lymphadenopathy noted.  No fluctuation, induration petechia/purpura, or erythema migrans noted.  Otherwise skin is dry, and intact.  Skin color appropriate for ethnicity.  Normal capillary refill.  No breaks in the skin noted.  Cardiac: Heart rate and rhythm are normal. No murmurs, gallops, or rubs are auscultated. S1 and S2 are heard and are of normal intensity.  Respiratory: No signs of respiratory distress. Lungs clear. No tachypnea. Able to speak in full sentences without dyspnea. Nonlabored respirations.  Neurological: The patient is awake, alert and oriented to person, place, and time with normal speech.  Memory is normal and thought processes intact. No gait abnormalities are appreciated.   Assessment:   Pruritus  Plan:   Prescribed mometasone and educated patient regarding instructions for use and side/adverse effects. Advised patient to take Zyrtec for itching.  Patient has taken this previously and tolerated it well.  Discussed OTC treatments for pruritus. Discussed side/adverse effects of medications. Advised pt to f/u with her dermatologist and/or primary care provider within the next two days or sooner if needed.  Red flag symptoms and indications to seek medical care discussed.  New Prescriptions  MOMETASONE (ELOCON) 0.1 % CREAM    Apply 1 application topically daily. Do not use for greater than two weeks.

## 2018-05-06 ENCOUNTER — Other Ambulatory Visit (HOSPITAL_COMMUNITY)
Admission: RE | Admit: 2018-05-06 | Discharge: 2018-05-06 | Disposition: A | Discharge status: Home / Self Care | Payer: Managed Care, Other (non HMO) | Source: Ambulatory Visit | Attending: Family Medicine | Admitting: Family Medicine

## 2018-05-06 ENCOUNTER — Encounter: Payer: Self-pay | Admitting: Family Medicine

## 2018-05-06 ENCOUNTER — Ambulatory Visit: Payer: Managed Care, Other (non HMO) | Admitting: Family Medicine

## 2018-05-06 VITALS — BP 110/64 | HR 82 | Temp 98.2°F | Ht 63.0 in | Wt 144.6 lb

## 2018-05-06 DIAGNOSIS — R21 Rash and other nonspecific skin eruption: Secondary | ICD-10-CM

## 2018-05-06 DIAGNOSIS — R300 Dysuria: Secondary | ICD-10-CM

## 2018-05-06 DIAGNOSIS — N898 Other specified noninflammatory disorders of vagina: Secondary | ICD-10-CM | POA: Diagnosis not present

## 2018-05-06 NOTE — Progress Notes (Signed)
BP 110/64   Pulse 82   Temp 98.2 F (36.8 C) (Oral)   Ht 5\' 3"  (1.6 m)   Wt 144 lb 9.6 oz (65.6 kg)   SpO2 98%   BMI 25.61 kg/m    Subjective:    Patient ID: [name] [name], female    DOB: 06-19-1971, 47 y.o.   MRN: [id number]  HPI: [name] [name] is a 47 y.o. female  Chief Complaint  Patient presents with  . Insect Bite    throughout nite on left side of neck, underneath right breast and left side of knee.  Knee is very swollen, red,itchy and hot to touch.    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; bursitis in the right hip Headache yesterday but maybe from being tired; just hurting, all over; no neck stiffness except along left side  She thinks she has a yeast infection; she has white cloudy vaginal discharge; has had this before; no sexual partners; no pelvic pain; little burning with urination; started about a week duration  Depression screen [facility] 2/9 05/06/2018 12/16/2017 11/10/2017  Decreased Interest 0 0 0  Down, Depressed, Hopeless 0 0 0  PHQ - 2 Score 0 0 0  Altered sleeping 0 - -  Tired, decreased energy 0 - -  Change in appetite 0 - -  Feeling bad or failure about yourself  0 - -  Trouble concentrating 0 - -  Moving slowly or fidgety/restless 0 - -  Suicidal thoughts 0 - -  PHQ-9 Score 0 - -  Difficult doing work/chores Not difficult at all - -   Fall Risk  05/06/2018 12/16/2017 11/10/2017  Falls in the past year? Yes No No    Number falls in past yr: 1 - -  Injury with Fall? No - -    Relevant past medical, surgical, family and social history reviewed Past Medical History:  Diagnosis Date  . Allergy   . Arthritis   . Asthma   . Constipation   . Endometriosis    Uses Nuvaring continuously  . Heart murmur   . Ulcer    Past Surgical History:  Procedure Laterality Date  . ANKLE SURGERY    . ANKLE SURGERY Right 06/11/2017  . APPENDECTOMY    . CESAREAN SECTION    . CHOLECYSTECTOMY    . LAPAROSCOPIC ENDOMETRIOSIS FULGURATION    . LAPAROSCOPY     for endometriosis  . WISDOM TOOTH EXTRACTION     x2   Family History  Problem Relation Age of Onset  . COPD Mother   . Emphysema Mother   . Cancer Father        unknown  . Cancer Paternal Aunt        ovarian   Social History   Tobacco Use  . Smoking status: Never Smoker  . Smokeless tobacco: Never Used  Substance Use Topics  .  Alcohol use: No    Alcohol/week: 0.0 standard drinks  . Drug use: No     Office Visit from [facility]/06/2018 in [facility]  AUDIT-C Score  0      Interim medical history since last visit reviewed. Allergies and medications reviewed  Review of Systems Per HPI unless specifically indicated above     Objective:    BP 110/64   Pulse 82   Temp 98.2 F (36.8 C) (Oral)   Ht 5\' 3"  (1.6 m)   Wt 144 lb 9.6 oz (65.6 kg)   SpO2 98%   BMI 25.61 kg/m   Wt Readings from Last 3 Encounters:  05/06/18 144 lb 9.6 oz (65.6 kg)  03/18/18 145 lb (65.8 kg)  02/25/18 143 lb 1.6 oz (64.9 kg)    Physical Exam  Neck: Neck supple. No neck rigidity.  Cardiovascular: Normal rate and regular rhythm.  Pulmonary/Chest: Effort normal and breath sounds normal.  Abdominal: There is no tenderness. There is no CVA tenderness.  Musculoskeletal:       Legs: Skin:  Along the left sideof the neck, cluster of erythematous papules, all about the same size; no surrounding erythema or heat or induration; along the lateral aspect  RIGHT knee, there eis a large area of erythema and induration and heat; clustered erythematous papules within; area measures 7.5 cm by 8.5 cm; very warm to the touch; sore with flexion    Results for orders placed or performed in visit on 04/19/18  POCT urinalysis dipstick  Result Value Ref Range   Color, UA Yellow    Clarity, UA Clear    Glucose, UA Negative Negative   Bilirubin, UA Negative    Ketones, UA Negative    Spec Grav, UA 1.020 1.010 - 1.025   Blood, UA Negative    pH, UA 7.0 5.0 - 8.0   Protein, UA Negative Negative   Urobilinogen, UA 0.2 0.2 or 1.0 E.U./dL   Nitrite, UA Negative    Leukocytes, UA Negative Negative   Appearance     Odor        Assessment & Plan:   Problem List Items Addressed This Visit    None       Follow up plan: No follow-ups on file.  An after-visit summary was printed and given to the patient at check-out.  Please see the patient instructions which may contain other information and recommendations beyond what is mentioned above in the assessment and plan.  No orders of the defined types were placed in this encounter.   No orders of the defined types were placed in this encounter.

## 2018-05-06 NOTE — Patient Instructions (Signed)
Please see the dermatologist today, either in [place] 1:00 pm We'll get the testing done for your urine and vaginal discharge

## 2018-05-07 ENCOUNTER — Telehealth: Payer: Self-pay

## 2018-05-07 ENCOUNTER — Ambulatory Visit: Payer: Self-pay

## 2018-05-07 ENCOUNTER — Encounter: Payer: Self-pay | Admitting: Family Medicine

## 2018-05-07 LAB — URINALYSIS W MICROSCOPIC + REFLEX CULTURE
BILIRUBIN URINE: NEGATIVE
Bacteria, UA: NONE SEEN /HPF
Glucose, UA: NEGATIVE
HYALINE CAST: NONE SEEN /LPF
Hgb urine dipstick: NEGATIVE
KETONES UR: NEGATIVE
Leukocyte Esterase: NEGATIVE
Nitrites, Initial: NEGATIVE
Protein, ur: NEGATIVE
SPECIFIC GRAVITY, URINE: 1.03 (ref 1.001–1.03)
Squamous Epithelial / LPF: NONE SEEN /HPF (ref <=5)
WBC, UA: NONE SEEN /HPF (ref 0–5)
pH: 5.5 (ref 5.0–8.0)

## 2018-05-07 LAB — CERVICOVAGINAL ANCILLARY ONLY
Bacterial vaginitis: NEGATIVE
CANDIDA VAGINITIS: NEGATIVE
CHLAMYDIA, DNA PROBE: NEGATIVE
Neisseria Gonorrhea: NEGATIVE
Trichomonas: NEGATIVE

## 2018-05-07 LAB — NO CULTURE INDICATED

## 2018-05-07 MED ORDER — FLUCONAZOLE 150 MG PO TABS: 150.0000 mg | ORAL_TABLET | Freq: Once | ORAL | 0 refills | Status: AC

## 2018-05-07 NOTE — Telephone Encounter (Signed)
Copied from CRM [id number]. Topic: Inquiry >> May 07, 2018  4:30 PM Alexander Bergeron B wrote: Reason for CRM: pt called to inquire probably about her appt yesterday w/ Dr. Sherie Don; pt did not disclose reason for call but just asked to speak w/ Dr. Sherie Don; contact to advise

## 2018-05-07 NOTE — Telephone Encounter (Signed)
Pt states she saw derm yesterday and they started her on keflex for 7 days.  She states usually gets yeast infection with antibiotics and wants to see if you can call something in?

## 2018-05-07 NOTE — Telephone Encounter (Signed)
Yes, I'll be glad to and will send a MyChart note

## 2018-05-17 ENCOUNTER — Ambulatory Visit: Payer: Managed Care, Other (non HMO) | Admitting: Podiatry

## 2018-05-18 ENCOUNTER — Encounter: Payer: Self-pay | Admitting: Family Medicine

## 2018-05-19 ENCOUNTER — Other Ambulatory Visit: Payer: Self-pay

## 2018-05-19 DIAGNOSIS — Z889 Allergy status to unspecified drugs, medicaments and biological substances status: Secondary | ICD-10-CM

## 2018-05-19 MED ORDER — MONTELUKAST SODIUM 10 MG PO TABS: 10.0000 mg | ORAL_TABLET | Freq: Every day | ORAL | 1 refills | Status: DC

## 2018-05-19 NOTE — Telephone Encounter (Signed)
Refill request for general medication: Montelukast 10 mg  Last office visit: 05/06/2018  Last physical exam: None indicated  Follow-ups on file. None indicated

## 2018-05-25 ENCOUNTER — Ambulatory Visit: Payer: Managed Care, Other (non HMO) | Admitting: Family Medicine

## 2018-05-25 ENCOUNTER — Encounter: Payer: Self-pay | Admitting: Family Medicine

## 2018-05-25 VITALS — BP 126/84 | HR 81

## 2018-05-25 DIAGNOSIS — IMO0002 Reserved for concepts with insufficient information to code with codable children: Secondary | ICD-10-CM

## 2018-05-25 DIAGNOSIS — N803 Endometriosis of pelvic peritoneum: Secondary | ICD-10-CM | POA: Diagnosis not present

## 2018-05-25 DIAGNOSIS — R102 Pelvic and perineal pain: Secondary | ICD-10-CM | POA: Diagnosis not present

## 2018-05-25 MED ORDER — NORETHINDRONE ACETATE 5 MG PO TABS: 5.0000 mg | ORAL_TABLET | Freq: Every day | ORAL | 2 refills | Status: DC

## 2018-05-25 MED ORDER — IBUPROFEN 800 MG PO TABS: 800.0000 mg | ORAL_TABLET | Freq: Three times a day (TID) | ORAL | 1 refills | Status: DC | PRN

## 2018-05-25 NOTE — Progress Notes (Signed)
   Subjective:    Patient ID: [name] [name] is a 47 y.o. female presenting with Pelvic Pain (4-5 days)  on 05/25/2018  HPI: Has 4 day h/o cramping. Has NuvaRing in since 9/28. Reports cramping lasted in evenings for several hours. No associated vaginal bleeding. No GI complaints, no dysuria. Notes pain seems to be more on the left side now but does feel pain all over. Took ibuprofen which helped a little. Used Heating pad which helped a little. Had [employer] twice last week. No blood in stool, but stool was darker than normal. Recent cellulitis from bite with Antibiotic, keflex approx. 2 wks ago. Recent negative GC/Chlam and wet prep on 05/06/2018.  Review of Systems  Constitutional: Negative for chills and fever.  Respiratory: Negative for shortness of breath.   Cardiovascular: Negative for chest pain.  Gastrointestinal: Negative for abdominal pain, nausea and vomiting.  Genitourinary: Negative for dysuria.  Skin: Negative for rash.      Objective:    BP 126/84   Pulse 81  Physical Exam  Constitutional: She is oriented to person, place, and time. She appears well-developed and well-nourished. No distress.  HENT:  Head: Normocephalic and atraumatic.  Eyes: No scleral icterus.  Neck: Neck supple.  Cardiovascular: Normal rate.  Pulmonary/Chest: Effort normal.  Abdominal: Soft.  Genitourinary:  Genitourinary Comments: Diffuse pelvic pain L>R, uterus is tender as well.  Neurological: She is alert and oriented to person, place, and time.  Skin: Skin is warm and dry.  Psychiatric: She has a normal mood and affect.        Assessment & Plan:   Problem List Items Addressed This Visit      Unprioritized   Endometriosis of pelvis    Continue continuous NuvaRing, addition of Aygestin. Check u/s.      Relevant Medications   norethindrone (AYGESTIN) 5 MG tablet   ibuprofen (ADVIL,MOTRIN) 800 MG tablet    Other Visit Diagnoses    Pelvic pain    -  Primary   ? etiology, negative  cultures recently, diffuse tenderness--? ovarian cyst with rupture vs. worsening endometriosis pain vs. other--could be diverticulitis   Relevant Medications   norethindrone (AYGESTIN) 5 MG tablet   ibuprofen (ADVIL,MOTRIN) 800 MG tablet   Other Relevant Orders   US PELVIC COMPLETE WITH TRANSVAGINAL      Total face-to-face time with patient: 15 minutes. Over 50% of encounter was spent on counseling and coordination of care. Return in about 4 weeks (around 06/22/2018) for a follow-up.  Reva Bores 05/25/2018 10:07 AM

## 2018-05-25 NOTE — Assessment & Plan Note (Signed)
Continue continuous NuvaRing, addition of Aygestin. Check u/s.

## 2018-05-25 NOTE — Patient Instructions (Signed)
Pelvic Pain, Female °Pelvic pain is pain in your lower belly (abdomen), below your belly button and between your hips. The pain may start suddenly (acute), keep coming back (recurring), or last a long time (chronic). Pelvic pain that lasts longer than six months is considered chronic. There are many causes of pelvic pain. Sometimes the cause of your pelvic pain is not known. °Follow these instructions at home: °· Take over-the-counter and prescription medicines only as told by your doctor. °· Rest as told by your doctor. °· Do not have sex it if hurts. °· Keep a journal of your pelvic pain. Write down: °? When the pain started. °? Where the pain is located. °? What seems to make the pain better or worse, such as food or your menstrual cycle. °? Any symptoms you have along with the pain. °· Keep all follow-up visits as told by your doctor. This is important. °Contact a doctor if: °· Medicine does not help your pain. °· Your pain comes back. °· You have new symptoms. °· You have unusual vaginal discharge or bleeding. °· You have a fever or chills. °· You are having a hard time pooping (constipation). °· You have blood in your pee (urine) or poop (stool). °· Your pee smells bad. °· You feel weak or lightheaded. °Get help right away if: °· You have sudden pain that is very bad. °· Your pain continues to get worse. °· You have very bad pain and also have any of the following symptoms: °? A fever. °? Feeling stick to your stomach (nausea). °? Throwing up (vomiting). °? Being very sweaty. °· You pass out (lose consciousness). °This information is not intended to replace advice given to you by your health care provider. Make sure you discuss any questions you have with your health care provider. °Document Released: 01/14/2008 Document Revised: 08/22/2015 Document Reviewed: 05/18/2015 °Elsevier Interactive Patient Education © 2018 [employer] ° °

## 2018-05-28 ENCOUNTER — Ambulatory Visit
Admission: RE | Admit: 2018-05-28 | Discharge: 2018-05-28 | Disposition: A | Discharge status: Home / Self Care | Payer: Managed Care, Other (non HMO) | Source: Ambulatory Visit | Attending: Family Medicine | Admitting: Family Medicine

## 2018-05-28 DIAGNOSIS — R102 Pelvic and perineal pain: Secondary | ICD-10-CM | POA: Insufficient documentation

## 2018-06-01 ENCOUNTER — Telehealth: Payer: Self-pay | Admitting: Family Medicine

## 2018-06-01 DIAGNOSIS — R1032 Left lower quadrant pain: Secondary | ICD-10-CM

## 2018-06-01 NOTE — Telephone Encounter (Signed)
-----   Message from Lindell Spar, [place] sent at 05/31/2018  4:43 PM EDT ----- Regarding: FW: results for [place]    ----- Message ----- From: Tereso Newcomer, MD Sent: 05/31/2018   4:40 PM EDT To: Lindell Spar, NT Subject: RE: results for [place]                             Please forward to Dr. Shawnie Pons, she already told her the results.  She can send questions via MyChart.   ----- Message ----- From: Lindell Spar, NT Sent: 05/31/2018   9:03 AM EDT To: Tereso Newcomer, MD Subject: results for [place]                                 Patient received results from [place] and has a few questions. Requesting a call back.. thanks

## 2018-06-01 NOTE — Telephone Encounter (Signed)
Discussed that ovaries were not visualized, so unlikely to have anything wrong with them, likely too small. Concern for continued pain/cramping, despite addition of Aygestin, which would have helped endometriosis, pain. Will search for GI issue, specifically diverticulitis as possible issue, given left sided pain.

## 2018-06-11 ENCOUNTER — Ambulatory Visit
Admission: RE | Admit: 2018-06-11 | Discharge: 2018-06-11 | Disposition: A | Discharge status: Home / Self Care | Payer: Managed Care, Other (non HMO) | Source: Ambulatory Visit | Attending: Family Medicine | Admitting: Family Medicine

## 2018-06-11 DIAGNOSIS — R1032 Left lower quadrant pain: Secondary | ICD-10-CM | POA: Insufficient documentation

## 2018-06-11 MED ORDER — IOPAMIDOL (ISOVUE-300) INJECTION 61%
100.0000 mL | Freq: Once | INTRAVENOUS | Status: AC | PRN
  Administered 2018-06-11: 100 mL via INTRAVENOUS

## 2018-06-14 ENCOUNTER — Telehealth: Payer: Self-pay | Admitting: *Deleted

## 2018-06-14 NOTE — Telephone Encounter (Signed)
Pt called wanting to know results of CT scan, informed of results, and that Dr Shawnie Pons should review her results today.

## 2018-06-17 ENCOUNTER — Encounter: Payer: Self-pay | Admitting: *Deleted

## 2018-07-04 ENCOUNTER — Encounter: Payer: Self-pay | Admitting: Podiatry

## 2018-07-11 ENCOUNTER — Other Ambulatory Visit: Payer: Self-pay | Admitting: Family Medicine

## 2018-07-12 NOTE — Telephone Encounter (Signed)
Is she still my patient? Seems like she is seeing Lada now

## 2018-07-14 ENCOUNTER — Other Ambulatory Visit: Payer: Self-pay

## 2018-07-14 ENCOUNTER — Encounter: Payer: Self-pay | Admitting: Family Medicine

## 2018-07-14 DIAGNOSIS — J4520 Mild intermittent asthma, uncomplicated: Secondary | ICD-10-CM

## 2018-07-14 NOTE — Telephone Encounter (Signed)
Please check with patient Update PCP if needed Route refill to primary Schedule her for a physical with primary please

## 2018-07-14 NOTE — Telephone Encounter (Signed)
Refill request for general medication. Albuterol to [facility].   Last office visit 05/06/2018   No follow-ups on file.

## 2018-07-15 MED ORDER — ALBUTEROL SULFATE HFA 108 (90 BASE) MCG/ACT IN AERS: 2.0000 | INHALATION_SPRAY | Freq: Four times a day (QID) | RESPIRATORY_TRACT | 0 refills | Status: DC | PRN

## 2018-07-15 NOTE — Telephone Encounter (Signed)
Spoke with patient and she state wants to stay with Dr. Carlynn PurlSowles but she has no opening. Could Irving Burtonmily fill this medication for the patient and I will transfer her upfront to schedule an appt with Sowles.

## 2018-07-15 NOTE — Addendum Note (Signed)
Addended by: Maurice SmallBOYCE, Kary Sugrue E on: 07/15/2018 12:20 PM   Modules accepted: Orders

## 2018-07-15 NOTE — Telephone Encounter (Signed)
She has only seen Dr Sherie DonLada for acute visits

## 2018-07-16 NOTE — Telephone Encounter (Signed)
appt made for 12.9.19

## 2018-07-19 ENCOUNTER — Encounter: Payer: Self-pay | Admitting: Family Medicine

## 2018-07-19 ENCOUNTER — Ambulatory Visit: Payer: Managed Care, Other (non HMO) | Admitting: Family Medicine

## 2018-07-19 DIAGNOSIS — J4521 Mild intermittent asthma with (acute) exacerbation: Secondary | ICD-10-CM | POA: Diagnosis not present

## 2018-07-19 DIAGNOSIS — K219 Gastro-esophageal reflux disease without esophagitis: Secondary | ICD-10-CM | POA: Diagnosis not present

## 2018-07-19 DIAGNOSIS — M62838 Other muscle spasm: Secondary | ICD-10-CM

## 2018-07-19 DIAGNOSIS — J4520 Mild intermittent asthma, uncomplicated: Secondary | ICD-10-CM | POA: Diagnosis not present

## 2018-07-19 MED ORDER — FAMOTIDINE 40 MG PO TABS: 40.0000 mg | ORAL_TABLET | Freq: Every day | ORAL | 1 refills | Status: DC

## 2018-07-19 MED ORDER — CYCLOBENZAPRINE HCL 5 MG PO TABS: 5.0000 mg | ORAL_TABLET | Freq: Every day | ORAL | 0 refills | Status: DC

## 2018-07-19 MED ORDER — OMEPRAZOLE 20 MG PO CPDR: 20.0000 mg | DELAYED_RELEASE_CAPSULE | Freq: Every day | ORAL | 1 refills | Status: DC

## 2018-07-19 MED ORDER — FLUTICASONE FUROATE-VILANTEROL 100-25 MCG/INH IN AEPB: 1.0000 | INHALATION_SPRAY | Freq: Every day | RESPIRATORY_TRACT | 0 refills | Status: DC

## 2018-07-19 MED ORDER — PREDNISONE 20 MG PO TABS: 20.0000 mg | ORAL_TABLET | Freq: Two times a day (BID) | ORAL | 0 refills | Status: DC

## 2018-07-19 NOTE — Progress Notes (Signed)
Name: [name] [name]   MRN: [id number]    DOB: 12-01-1970   Date:07/19/2018       Progress Note  Subjective  Chief Complaint  Chief Complaint  Patient presents with  . Medication Refill  . Asthma    Feels winded sometimes   . Allergic Rhinitis     Little cough but has been ok for the most part    HPI  Asthma : she sates she usually has just two to 3 flares per year, usually triggered by weather changes, but this episode started a couple of weeks ago, she has a daily cough, nasal congestion, post-nasal drainage and rhinorrhea. Intermittent SOB but not much wheezing. No fever or chills.   Muscle spasms: left lower back, she went to [facility] for back [facility] in the past, pain not as bad now but would like a refill of flexeril, feels a fluttering/spasms and muscle relaxer helped with symptoms in the past.   GERD: she has been taking omeprazole 20 mg BID, she was rx in our office 20 mg in am, but had to go back to employee [facility] and advised to double the dose. Discussed long term risk of PPI. She is willing to try PPI in am and H2 blocker in pm. She was having indigestion and regurgitation but is doing better now.   Patient Active Problem List   Diagnosis Date Noted  . Asthma, intermittent with acute exacerbation 07/19/2018  . 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 Post-traumatic osteoarthritis of right foot 09/08/2016  . Endometriosis of pelvis 01/05/2013    Past Surgical History:  Procedure Laterality Date  . ANKLE SURGERY    . ANKLE SURGERY Right 06/11/2017  . APPENDECTOMY  2008  . CESAREAN SECTION  1995  . CHOLECYSTECTOMY  2000  . LAPAROSCOPIC ENDOMETRIOSIS FULGURATION    . LAPAROSCOPY     for endometriosis  . WISDOM TOOTH EXTRACTION     x2    Family History  Problem Relation Age of Onset  . COPD Mother   . Emphysema Mother   . Cancer Father        unknown  . Cancer Paternal Aunt        ovarian    Social History    Socioeconomic History  . Marital status: Single    Spouse name: Not on file  . Number of children: 1  . Years of education: Not on file  . Highest education level: Not on file  Occupational History  . Not on file  Social Needs  . Financial resource strain: Not hard at all  . Food insecurity:    Worry: Never true    Inability: Never true  . Transportation needs:    Medical: No    Non-medical: No  Tobacco Use  . Smoking status: Never Smoker  . Smokeless tobacco: Never Used  Substance and Sexual Activity  . Alcohol use: No    Alcohol/week: 0.0 standard drinks  . Drug use: No  . Sexual activity: Yes    Birth control/protection: Inserts    Comment: Nuva ring  Lifestyle  . Physical activity:    Days per week: 0 days    Minutes per session: 0 min  . Stress: Only a little  Relationships  . Social connections:    Talks on phone: More than three times a week    Gets together: More than three times a week    Attends religious service: More than 4 times per  year    Active member of club or organization: No    Attends meetings of clubs or organizations: Never    Relationship status: Never married  . Intimate partner violence:    Fear of current or ex partner: No    Emotionally abused: No    Physically abused: No    Forced sexual activity: No  Other Topics Concern  . Not on file  Social History Narrative  . Not on file     Current Outpatient Medications:  .  albuterol (PROVENTIL HFA;VENTOLIN HFA) 108 (90 Base) MCG/ACT inhaler, Inhale 2 puffs into the lungs every 6 (six) hours as needed for wheezing or shortness of breath., Disp: 1 Inhaler, Rfl: 0 .  fluticasone (FLONASE) 50 MCG/ACT nasal spray, Place 2 sprays into both nostrils daily., Disp: 16 g, Rfl: 6 .  ibuprofen (ADVIL,MOTRIN) 800 MG tablet, Take 1 tablet (800 mg total) by mouth every 8 (eight) hours as needed., Disp: 60 tablet, Rfl: 1 .  levocetirizine (XYZAL) 5 MG tablet, Take 1 tablet (5 mg total) by mouth daily  as needed for allergies., Disp: 30 tablet, Rfl: 11 .  montelukast (SINGULAIR) 10 MG tablet, Take 1 tablet (10 mg total) by mouth at bedtime., Disp: 90 tablet, Rfl: 1 .  norethindrone (AYGESTIN) 5 MG tablet, Take 1 tablet (5 mg total) by mouth daily., Disp: 60 tablet, Rfl: 2 .  NUVARING 0.12-0.015 MG/24HR vaginal ring, Place 1 each vaginally every 28 (twenty-eight) days. Use in continuous fashion for endometriosis, Disp: 3 each, Rfl: 6 .  omeprazole (PRILOSEC) 20 MG capsule, Take 1 capsule (20 mg total) by mouth daily., Disp: 90 capsule, Rfl: 1 .  triamcinolone cream (KENALOG) 0.1 %, Apply to itchy bump bid as needed, Disp: , Rfl:  .  cyclobenzaprine (FLEXERIL) 5 MG tablet, Take 1 tablet (5 mg total) by mouth at bedtime., Disp: 30 tablet, Rfl: 0 .  famotidine (PEPCID) 40 MG tablet, Take 1 tablet (40 mg total) by mouth at bedtime., Disp: 90 tablet, Rfl: 1  Allergies  Allergen Reactions  . Mosquito (Diagnostic) Swelling    I personally reviewed active problem list, medication list, allergies, family history, social history with the patient/caregiver today.   ROS  Constitutional: Negative for fever or weight change.  Respiratory: Negative for cough and shortness of breath.   Cardiovascular: Negative for chest pain or palpitations.  Gastrointestinal: Negative for abdominal pain, no bowel changes.  Musculoskeletal: Negative for gait problem or joint swelling.  Skin: Negative for rash.  Neurological: Negative for dizziness or headache.  No other specific complaints in a complete review of systems (except as listed in HPI above).  Objective  Vitals:   07/19/18 1429  BP: 136/82  Pulse: 84  Resp: 16  Temp: 98 F (36.7 C)  TempSrc: Oral  SpO2: 98%  Weight: 150 lb 9.6 oz (68.3 kg)  Height: 5\' 3"  (1.6 m)    Body mass index is 26.68 kg/m.  Physical Exam  Constitutional: Patient appears well-developed and well-nourished. No distress.  HEENT: head atraumatic, normocephalic, pupils  equal and reactive to light,  neck supple, throat within normal limits Cardiovascular: Normal rate, regular rhythm and normal heart sounds.  No murmur heard. No BLE edema. Pulmonary/Chest: Effort normal and breath sounds normal. No respiratory distress. Abdominal: Soft.  There is no tenderness. Psychiatric: Patient has a normal mood and affect. behavior is normal. Judgment and thought content normal.  Recent Results (from the past 2160 hour(s))  Cervicovaginal ancillary only  Status: None   Collection Time: 05/06/18 12:00 AM  Result Value Ref Range   Bacterial vaginitis Negative for Bacterial Vaginitis Microorganisms     Comment: Normal Reference Range - Negative   Candida vaginitis Negative for Candida species     Comment: Normal Reference Range - Negative   Chlamydia Negative     Comment: Normal Reference Range - Negative   Neisseria gonorrhea Negative     Comment: Normal Reference Range - Negative   Trichomonas Negative     Comment: Normal Reference Range - Negative  Urinalysis w microscopic + reflex cultur     Status: None   Collection Time: 05/06/18 12:00 PM  Result Value Ref Range   Color, Urine YELLOW YELLOW   APPearance CLEAR CLEAR   Specific Gravity, Urine 1.030 1.001 - 1.03   pH 5.5 5.0 - 8.0   Glucose, UA NEGATIVE NEGATIVE   Bilirubin Urine NEGATIVE NEGATIVE   Ketones, ur NEGATIVE NEGATIVE   Hgb urine dipstick NEGATIVE NEGATIVE   Protein, ur NEGATIVE NEGATIVE   Nitrites, Initial NEGATIVE NEGATIVE   Leukocyte Esterase NEGATIVE NEGATIVE   WBC, UA NONE SEEN 0 - 5 /HPF   RBC / HPF 0-2 0 - 2 /HPF   Squamous Epithelial / LPF NONE SEEN < OR = 5 /HPF   Bacteria, UA NONE SEEN NONE SEEN /HPF   Hyaline Cast NONE SEEN NONE SEEN /LPF  REFLEXIVE URINE CULTURE     Status: None   Collection Time: 05/06/18 12:00 PM  Result Value Ref Range   Reflexve Urine Culture NO CULTURE INDICATED     PHQ2/9: Depression screen [facility] 2/9 05/06/2018 12/16/2017 11/10/2017  Decreased Interest 0 0  0  Down, Depressed, Hopeless 0 0 0  PHQ - 2 Score 0 0 0  Altered sleeping 0 - -  Tired, decreased energy 0 - -  Change in appetite 0 - -  Feeling bad or failure about yourself  0 - -  Trouble concentrating 0 - -  Moving slowly or fidgety/restless 0 - -  Suicidal thoughts 0 - -  PHQ-9 Score 0 - -  Difficult doing work/chores Not difficult at all - -    Fall Risk: Fall Risk  07/19/2018 05/06/2018 12/16/2017 11/10/2017  Falls in the past year? 0 Yes No No  Number falls in past yr: 0 1 - -  Injury with Fall? - No - -     Functional Status Survey: Is the patient deaf or have difficulty hearing?: No Does the patient have difficulty seeing, even when wearing glasses/contacts?: Yes(glasses) Does the patient have difficulty concentrating, remembering, or making decisions?: Yes(a little) Does the patient have difficulty walking or climbing stairs?: No Does the patient have difficulty dressing or bathing?: No Does the patient have difficulty doing errands alone such as visiting a doctor's office or shopping?: No    Assessment & Plan  1. Gastroesophageal reflux disease without esophagitis  - omeprazole (PRILOSEC) 20 MG capsule; Take 1 capsule (20 mg total) by mouth daily.  Dispense: 90 capsule; Refill: 1 - famotidine (PEPCID) 40 MG tablet; Take 1 tablet (40 mg total) by mouth at bedtime.  Dispense: 90 tablet; Refill: 1  2. Muscle spasm  - cyclobenzaprine (FLEXERIL) 5 MG tablet; Take 1 tablet (5 mg total) by mouth at bedtime.  Dispense: 30 tablet; Refill: 0  3. Mild intermittent asthma with exacerbation  - predniSONE (DELTASONE) 20 MG tablet; Take 1 tablet (20 mg total) by mouth 2 (two) times daily with a meal.  Dispense: 10 tablet; Refill: 0 - fluticasone furoate-vilanterol (BREO ELLIPTA) 100-25 MCG/INH AEPB; Inhale 1 puff into the lungs daily.  Dispense: 60 each; Refill: 0

## 2018-08-16 ENCOUNTER — Observation Stay
Admission: EM | Admit: 2018-08-16 | Discharge: 2018-08-18 | Disposition: A | Discharge status: Home / Self Care | Payer: Managed Care, Other (non HMO) | Attending: Internal Medicine | Admitting: Internal Medicine

## 2018-08-16 ENCOUNTER — Other Ambulatory Visit: Payer: Self-pay

## 2018-08-16 ENCOUNTER — Encounter: Payer: Self-pay | Admitting: Emergency Medicine

## 2018-08-16 ENCOUNTER — Emergency Department: Payer: Managed Care, Other (non HMO)

## 2018-08-16 DIAGNOSIS — R609 Edema, unspecified: Secondary | ICD-10-CM

## 2018-08-16 DIAGNOSIS — J189 Pneumonia, unspecified organism: Secondary | ICD-10-CM | POA: Diagnosis not present

## 2018-08-16 DIAGNOSIS — R011 Cardiac murmur, unspecified: Secondary | ICD-10-CM | POA: Diagnosis not present

## 2018-08-16 DIAGNOSIS — Z79899 Other long term (current) drug therapy: Secondary | ICD-10-CM | POA: Insufficient documentation

## 2018-08-16 DIAGNOSIS — Z791 Long term (current) use of non-steroidal anti-inflammatories (NSAID): Secondary | ICD-10-CM | POA: Insufficient documentation

## 2018-08-16 DIAGNOSIS — R079 Chest pain, unspecified: Secondary | ICD-10-CM | POA: Diagnosis present

## 2018-08-16 DIAGNOSIS — R0602 Shortness of breath: Secondary | ICD-10-CM | POA: Diagnosis present

## 2018-08-16 DIAGNOSIS — R05 Cough: Secondary | ICD-10-CM | POA: Diagnosis present

## 2018-08-16 LAB — POCT PREGNANCY, URINE: Preg Test, Ur: NEGATIVE

## 2018-08-16 LAB — CBC
HCT: 46.9 % — ABNORMAL HIGH (ref 36.0–46.0)
Hemoglobin: 15.2 g/dL — ABNORMAL HIGH (ref 12.0–15.0)
MCH: 28.4 pg (ref 26.0–34.0)
MCHC: 32.4 g/dL (ref 30.0–36.0)
MCV: 87.7 fL (ref 80.0–100.0)
PLATELETS: 387 10*3/uL (ref 150–400)
RBC: 5.35 MIL/uL — ABNORMAL HIGH (ref 3.87–5.11)
RDW: 13.8 % (ref 11.5–15.5)
WBC: 7.1 10*3/uL (ref 4.0–10.5)
nRBC: 0 % (ref 0.0–0.2)

## 2018-08-16 LAB — BASIC METABOLIC PANEL
Anion gap: 6 (ref 5–15)
BUN: 13 mg/dL (ref 6–20)
CALCIUM: 8.8 mg/dL — AB (ref 8.9–10.3)
CO2: 25 mmol/L (ref 22–32)
CREATININE: 0.53 mg/dL (ref 0.44–1.00)
Chloride: 107 mmol/L (ref 98–111)
GFR calc Af Amer: >60 mL/min (ref >60)
GFR calc non Af Amer: >60 mL/min (ref >60)
Glucose, Bld: 93 mg/dL (ref 70–99)
Potassium: 4.4 mmol/L (ref 3.5–5.1)
Sodium: 138 mmol/L (ref 135–145)

## 2018-08-16 LAB — TROPONIN I

## 2018-08-16 LAB — PREGNANCY, URINE: Preg Test, Ur: NEGATIVE

## 2018-08-16 LAB — FIBRIN DERIVATIVES D-DIMER (ARMC ONLY): Fibrin derivatives D-dimer (ARMC): 5420.53 ng/mL (FEU) — ABNORMAL HIGH (ref 0.00–499.00)

## 2018-08-16 MED ORDER — IOHEXOL 350 MG/ML SOLN
75.0000 mL | Freq: Once | INTRAVENOUS | Status: AC | PRN
  Administered 2018-08-16: 75 mL via INTRAVENOUS

## 2018-08-16 NOTE — ED Notes (Signed)
Discussed pt with dr Darnelle Catalan. Will send d dimer.

## 2018-08-16 NOTE — ED Notes (Signed)
Contact # for Boyfriend [place] [phone]

## 2018-08-16 NOTE — ED Notes (Signed)
Ok for patient to eat as per Dr. Roxan Hockey. Family at bedside with food from home.

## 2018-08-16 NOTE — ED Provider Notes (Signed)
[facility] Emergency Department Provider Note   ____________________________________________   First MD Initiated Contact with Patient 08/16/18 1856     (approximate)  I have reviewed the triage vital signs and the nursing notes.   HISTORY  Chief Complaint Chest Pain   HPI [name] [name] is a 48 y.o. female who has endometriosis.  She has been reporting intermittent chest pain she told the nurse it was right sided told me it was in the center of the chest worse with exertion sometimes it radiates to her right arm today it was also getting tight in her back.  Pain is been intermittent for some time weeks but getting slowly worse and today was the worst day ever.  She is not had any sweating or nausea with it.  She does get short of breath with the chest pain.  Past Medical History:  Diagnosis Date  . Allergy   . Arthritis   . Asthma   . Constipation   . Endometriosis    Uses Nuvaring continuously  . Heart murmur   . Ulcer     Patient Active Problem List   Diagnosis Date Noted  . Asthma, intermittent with acute exacerbation 07/19/2018  . 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 Post-traumatic osteoarthritis of right foot 09/08/2016  . Endometriosis of pelvis 01/05/2013    Past Surgical History:  Procedure Laterality Date  . ANKLE SURGERY    . ANKLE SURGERY Right 06/11/2017  . APPENDECTOMY  2008  . CESAREAN SECTION  1995  . CHOLECYSTECTOMY  2000  . LAPAROSCOPIC ENDOMETRIOSIS FULGURATION    . LAPAROSCOPY     for endometriosis  . WISDOM TOOTH EXTRACTION     x2    Prior to Admission medications   Medication Sig Start Date End Date Taking? Authorizing Provider  albuterol (PROVENTIL HFA;VENTOLIN HFA) 108 (90 Base) MCG/ACT inhaler Inhale 2 puffs into the lungs every 6 (six) hours as needed for wheezing or shortness of breath. 07/15/18   Doren CustardBoyce, Emily E, FNP  cyclobenzaprine (FLEXERIL) 5 MG tablet Take 1 tablet (5  mg total) by mouth at bedtime. 07/19/18   Alba CorySowles, Krichna, MD  famotidine (PEPCID) 40 MG tablet Take 1 tablet (40 mg total) by mouth at bedtime. 07/19/18   Alba CorySowles, Krichna, MD  fluticasone (FLONASE) 50 MCG/ACT nasal spray Place 2 sprays into both nostrils daily. 01/01/18   Poulose, Percell BeltElizabeth E, NP  fluticasone furoate-vilanterol (BREO ELLIPTA) 100-25 MCG/INH AEPB Inhale 1 puff into the lungs daily. 07/19/18   Alba CorySowles, Krichna, MD  ibuprofen (ADVIL,MOTRIN) 800 MG tablet Take 1 tablet (800 mg total) by mouth every 8 (eight) hours as needed. 05/25/18   Reva BoresPratt, Tanya S, MD  levocetirizine (XYZAL) 5 MG tablet Take 1 tablet (5 mg total) by mouth daily as needed for allergies. 12/16/17   Lada, Janit BernMelinda P, MD  montelukast (SINGULAIR) 10 MG tablet Take 1 tablet (10 mg total) by mouth at bedtime. 05/19/18   Poulose, Percell BeltElizabeth E, NP  norethindrone (AYGESTIN) 5 MG tablet Take 1 tablet (5 mg total) by mouth daily. 05/25/18   Reva BoresPratt, Tanya S, MD  NUVARING 0.12-0.015 MG/24HR vaginal ring Place 1 each vaginally every 28 (twenty-eight) days. Use in continuous fashion for endometriosis 02/25/18   Anyanwu, Jethro BastosUgonna A, MD  omeprazole (PRILOSEC) 20 MG capsule Take 1 capsule (20 mg total) by mouth daily. 07/19/18   Alba CorySowles, Krichna, MD  predniSONE (DELTASONE) 20 MG tablet Take 1 tablet (20 mg total) by mouth 2 (two)  times daily with a meal. 07/19/18   Alba CorySowles, Krichna, MD  triamcinolone cream (KENALOG) 0.1 % Apply to itchy bump bid as needed 05/06/18   [provider]    Allergies Mosquito (diagnostic)  Family History  Problem Relation Age of Onset  . COPD Mother   . Emphysema Mother   . Cancer Father        unknown  . Cancer Paternal Aunt        ovarian    Social History Social History   Tobacco Use  . Smoking status: Never Smoker  . Smokeless tobacco: Never Used  Substance Use Topics  . Alcohol use: No    Alcohol/week: 0.0 standard drinks  . Drug use: No    Review of Systems  Constitutional: No  fever/chills Eyes: No visual changes. ENT: No sore throat. Cardiovascular:  chest pain. Respiratory shortness of breath. Gastrointestinal: No abdominal pain.  No nausea, no vomiting.  No diarrhea.  No constipation. Genitourinary: Negative for dysuria. Musculoskeletal: Negative for back pain. Skin: Negative for rash. Neurological: Negative for headaches, focal weakness   ____________________________________________   PHYSICAL EXAM:  VITAL SIGNS: ED Triage Vitals [08/16/18 1722]  Enc Vitals Group     BP (!) 153/87     Pulse Rate 86     Resp 16     Temp 98.5 F (36.9 C)     Temp Source Oral     SpO2 100 %     Weight 150 lb (68 kg)     Height 5\' 3"  (1.6 m)     Head Circumference      Peak Flow      Pain Score 4     Pain Loc      Pain Edu?      Excl. in GC?    Constitutional: Alert and oriented. Well appearing and in no acute distress. Eyes: Conjunctivae are normal.  Head: Atraumatic. Nose: No congestion/rhinnorhea. Mouth/Throat: Mucous membranes are moist.  Oropharynx non-erythematous. Neck: No stridor.  Cardiovascular: Normal rate, regular rhythm. Grossly normal heart sounds.  Good peripheral circulation. Respiratory: Normal respiratory effort.  No retractions. Lungs CTAB. Gastrointestinal: Soft and nontender. No distention. No abdominal bruits. No CVA tenderness. Musculoskeletal: No lower extremity tenderness swelling in the right leg which she has had since she had surgery on the right foot.  Patient reports he gets better and worse but never goes away Neurologic:  Normal speech and language. No gross focal neurologic deficits are appreciated. No gait instability. Skin:  Skin is warm, dry and intact. No rash noted. Psychiatric: Mood and affect are normal. Speech and behavior are normal.  ____________________________________________   LABS (all labs ordered are listed, but only abnormal results are displayed)  Labs Reviewed  BASIC METABOLIC PANEL - Abnormal;  Notable for the following components:      Result Value   Calcium 8.8 (*)    All other components within normal limits  CBC - Abnormal; Notable for the following components:   RBC 5.35 (*)    Hemoglobin 15.2 (*)    HCT 46.9 (*)    All other components within normal limits  FIBRIN DERIVATIVES D-DIMER ([facility] ONLY) - Abnormal; Notable for the following components:   Fibrin derivatives D-dimer [facility] 5,420.53 (*)    All other components within normal limits  TROPONIN I  PREGNANCY, URINE  POC URINE PREG, ED   ____________________________________________  EKG EKG read interpreted by me shows normal sinus rhythm at 5 normal axis essentially normal EKG  EKG #  2 read and interpreted by me shows normal sinus rhythm at 77 normal axis very similar to an EKG #1.  This EKG EKG #2 is with pain ____________________________________________  RADIOLOGY  ED MD interpretation: Chest x-ray read by radiology reviewed by me shows no acute changes  Official radiology report(s): Dg Chest 2 View  Result Date: 08/16/2018 CLINICAL DATA:  Chest pain for the past week. EXAM: CHEST - 2 VIEW COMPARISON:  Chest x-ray dated Dec 16, 2017. FINDINGS: The heart size and mediastinal contours are within normal limits. Both lungs are clear. The visualized skeletal structures are unremarkable. IMPRESSION: No active cardiopulmonary disease. Electronically Signed   By: Obie Dredge M.D.   On: 08/16/2018 17:58   Ct Angio Chest Pe W And/or Wo Contrast  Result Date: 08/16/2018 CLINICAL DATA:  PE suspected, intermediate prob, positive D-dimer Patient with pleuritic chest pain and extremely high d-dimer. Intermittent right-sided chest pain radiating to the right arm. EXAM: CT ANGIOGRAPHY CHEST WITH CONTRAST TECHNIQUE: Multidetector CT imaging of the chest was performed using the standard protocol during bolus administration of intravenous contrast. Multiplanar CT image reconstructions and MIPs were obtained to evaluate the vascular  anatomy. CONTRAST:  75mL OMNIPAQUE IOHEXOL 350 MG/ML SOLN COMPARISON:  Radiograph earlier today. Chest CT 12/23/2010 FINDINGS: Cardiovascular: There are no filling defects within the pulmonary arteries to suggest pulmonary embolus. Thoracic aorta is normal in caliber without dissection. Common origin of the brachiocephalic and left common carotid artery, normal variant arch anatomy. The heart is normal in size. No pericardial effusion. Mediastinum/Nodes: Small bilateral hilar and mediastinal nodes, all subcentimeter short axis and likely reactive. The esophagus is decompressed. No visualized thyroid nodule. Lungs/Pleura: Mild scattered ground-glass and ill-defined opacities in a basilar and peripheral predominant pattern. No consolidative airspace disease. Minimal subpleural scarring in the right upper lobe. No pulmonary edema or pleural effusion. No pulmonary mass. The trachea and mainstem bronchi are patent. Upper Abdomen: No acute abnormality. Musculoskeletal: There are no acute or suspicious osseous abnormalities. Review of the MIP images confirms the above findings. IMPRESSION: 1. No pulmonary embolus. 2. Mild scattered ground-glass and ill-defined opacities in a basilar and peripheral predominant pattern, likely infectious or inflammatory pneumonitis. Electronically Signed   By: Narda Rutherford M.D.   On: 08/16/2018 19:17    ____________________________________________   PROCEDURES  Procedure(s) performed:   Procedures  Critical Care performed:   ____________________________________________   INITIAL IMPRESSION / ASSESSMENT AND PLAN / ED COURSE  Patient on hormone replacement therapy and now on a new her ring with some swelling in her leg and this chest tightness and pain d-dimer greater than 5000 we will CT her to make sure there is no PE.  Unlikely to be an aneurysm.  Could perhaps be unstable angina or just her asthma.  I do not hear any wheezing and she does not appear to be short of  breath at the present time.  CT scan shows inflammatory infectious pneumonitis.  This could explain the patient's chest pain and other symptoms.  She could also have crescendo angina as well.  I will plan on getting her in the hospital to elucidate the etiology of her chest pain and to begin treatment.       ____________________________________________   FINAL CLINICAL IMPRESSION(S) / ED DIAGNOSES  Final diagnoses:  Chest pain, unspecified type  Pneumonitis     ED Discharge Orders    None       Note:  This document was prepared using Dragon voice recognition software and  may include unintentional dictation errors.    Arnaldo Natal, MD 08/16/18 2018

## 2018-08-16 NOTE — ED Triage Notes (Addendum)
Pt here for intermittent right sided chest pain that is worse today. Pain radiates to right arm. States "my head feels weird".  Pt on nuva ring and recently stopped taking hormone supplements 1 week ago. Did have blood clots after c-section in abdomen but no other hx of same. Pain worse on deep breath.  Appears uncomfortable.

## 2018-08-17 ENCOUNTER — Encounter: Payer: Self-pay | Admitting: Internal Medicine

## 2018-08-17 ENCOUNTER — Observation Stay (HOSPITAL_BASED_OUTPATIENT_CLINIC_OR_DEPARTMENT_OTHER)
Admit: 2018-08-17 | Discharge: 2018-08-17 | Disposition: A | Discharge status: Home / Self Care | Payer: Managed Care, Other (non HMO) | Attending: Internal Medicine | Admitting: Internal Medicine

## 2018-08-17 ENCOUNTER — Observation Stay: Payer: Managed Care, Other (non HMO)

## 2018-08-17 DIAGNOSIS — R079 Chest pain, unspecified: Secondary | ICD-10-CM | POA: Diagnosis present

## 2018-08-17 DIAGNOSIS — R0602 Shortness of breath: Secondary | ICD-10-CM

## 2018-08-17 LAB — RESPIRATORY PANEL BY PCR

## 2018-08-17 LAB — PROCALCITONIN: Procalcitonin: <0.1 ng/mL

## 2018-08-17 LAB — HEPATIC FUNCTION PANEL
ALT: 21 U/L (ref 0–44)
AST: 22 U/L (ref 15–41)
Albumin: 3.6 g/dL (ref 3.5–5.0)
Alkaline Phosphatase: 72 U/L (ref 38–126)
Total Bilirubin: 0.5 mg/dL (ref 0.3–1.2)
Total Protein: 7.1 g/dL (ref 6.5–8.1)

## 2018-08-17 LAB — LACTIC ACID, PLASMA: Lactic Acid, Venous: 1.1 mmol/L (ref 0.5–1.9)

## 2018-08-17 LAB — INFLUENZA PANEL BY PCR (TYPE A & B)
Influenza A By PCR: NEGATIVE
Influenza B By PCR: NEGATIVE

## 2018-08-17 LAB — MAGNESIUM: Magnesium: 2.5 mg/dL — ABNORMAL HIGH (ref 1.7–2.4)

## 2018-08-17 LAB — PHOSPHORUS: Phosphorus: 3.8 mg/dL (ref 2.5–4.6)

## 2018-08-17 LAB — TROPONIN I: Troponin I: <0.03 ng/mL (ref <0.03)

## 2018-08-17 MED ORDER — CYCLOBENZAPRINE HCL 10 MG PO TABS: 5.0000 mg | ORAL_TABLET | Freq: Every evening | ORAL | Status: DC | PRN

## 2018-08-17 MED ORDER — SODIUM CHLORIDE 0.9 % IV SOLN
1.0000 g | INTRAVENOUS | Status: DC
  Administered 2018-08-17 – 2018-08-18 (×2): 1 g via INTRAVENOUS
  Filled 2018-08-17 (×2): qty 1
  Filled 2018-08-17: qty 10

## 2018-08-17 MED ORDER — FLUTICASONE FUROATE-VILANTEROL 100-25 MCG/INH IN AEPB
1.0000 | INHALATION_SPRAY | Freq: Every day | RESPIRATORY_TRACT | Status: DC
  Administered 2018-08-17 – 2018-08-18 (×2): 1 via RESPIRATORY_TRACT
  Filled 2018-08-17: qty 28

## 2018-08-17 MED ORDER — PANTOPRAZOLE SODIUM 40 MG PO TBEC
40.0000 mg | DELAYED_RELEASE_TABLET | Freq: Every day | ORAL | Status: DC
  Administered 2018-08-17 – 2018-08-18 (×2): 40 mg via ORAL
  Filled 2018-08-17 (×2): qty 1

## 2018-08-17 MED ORDER — ACETAMINOPHEN 650 MG RE SUPP
650.0000 mg | Freq: Four times a day (QID) | RECTAL | Status: DC | PRN
  Filled 2018-08-17: qty 1

## 2018-08-17 MED ORDER — MORPHINE SULFATE (PF) 4 MG/ML IV SOLN: 4.0000 mg | INTRAVENOUS | Status: DC | PRN

## 2018-08-17 MED ORDER — METHYLPREDNISOLONE SODIUM SUCC 125 MG IJ SOLR
60.0000 mg | Freq: Two times a day (BID) | INTRAMUSCULAR | Status: DC
  Administered 2018-08-17: 60 mg via INTRAVENOUS
  Filled 2018-08-17: qty 2
  Filled 2018-08-17: qty 0.96

## 2018-08-17 MED ORDER — PREDNISONE 10 MG PO TABS: 5.0000 mg | ORAL_TABLET | Freq: Every day | ORAL | Status: DC

## 2018-08-17 MED ORDER — PREDNISONE 10 MG PO TABS: 10.0000 mg | ORAL_TABLET | Freq: Every day | ORAL | Status: DC

## 2018-08-17 MED ORDER — MORPHINE SULFATE (PF) 2 MG/ML IV SOLN: 2.0000 mg | INTRAVENOUS | Status: DC | PRN

## 2018-08-17 MED ORDER — IPRATROPIUM-ALBUTEROL 0.5-2.5 (3) MG/3ML IN SOLN: 3.0000 mL | RESPIRATORY_TRACT | Status: DC | PRN

## 2018-08-17 MED ORDER — AZITHROMYCIN 500 MG PO TABS
500.0000 mg | ORAL_TABLET | Freq: Every day | ORAL | Status: AC
  Administered 2018-08-17: 500 mg via ORAL
  Filled 2018-08-17: qty 1

## 2018-08-17 MED ORDER — PREDNISONE 50 MG PO TABS: 50.0000 mg | ORAL_TABLET | Freq: Every day | ORAL | Status: DC

## 2018-08-17 MED ORDER — PREDNISONE 20 MG PO TABS: 30.0000 mg | ORAL_TABLET | Freq: Every day | ORAL | Status: DC

## 2018-08-17 MED ORDER — SENNOSIDES-DOCUSATE SODIUM 8.6-50 MG PO TABS
1.0000 | ORAL_TABLET | Freq: Every evening | ORAL | Status: DC | PRN
  Filled 2018-08-17: qty 1

## 2018-08-17 MED ORDER — AZITHROMYCIN 250 MG PO TABS
250.0000 mg | ORAL_TABLET | Freq: Every day | ORAL | Status: DC
  Administered 2018-08-18: 250 mg via ORAL
  Filled 2018-08-17: qty 1

## 2018-08-17 MED ORDER — PREDNISONE 20 MG PO TABS: 20.0000 mg | ORAL_TABLET | Freq: Every day | ORAL | Status: DC

## 2018-08-17 MED ORDER — FLUTICASONE PROPIONATE 50 MCG/ACT NA SUSP
1.0000 | Freq: Every day | NASAL | Status: DC
  Administered 2018-08-17: 1 via NASAL
  Filled 2018-08-17: qty 16

## 2018-08-17 MED ORDER — MONTELUKAST SODIUM 10 MG PO TABS
10.0000 mg | ORAL_TABLET | Freq: Every day | ORAL | Status: DC
  Administered 2018-08-17 – 2018-08-18 (×2): 10 mg via ORAL
  Filled 2018-08-17 (×2): qty 1

## 2018-08-17 MED ORDER — PREDNISONE 20 MG PO TABS: 40.0000 mg | ORAL_TABLET | Freq: Every day | ORAL | Status: DC

## 2018-08-17 MED ORDER — SODIUM CHLORIDE 0.9 % IV SOLN
INTRAVENOUS | Status: DC
  Administered 2018-08-17: 13:00:00 via INTRAVENOUS

## 2018-08-17 MED ORDER — KETOROLAC TROMETHAMINE 30 MG/ML IJ SOLN
15.0000 mg | Freq: Once | INTRAMUSCULAR | Status: AC
  Administered 2018-08-17: 15 mg via INTRAVENOUS
  Filled 2018-08-17: qty 1

## 2018-08-17 MED ORDER — FAMOTIDINE 20 MG PO TABS: 40.0000 mg | ORAL_TABLET | Freq: Every day | ORAL | Status: DC

## 2018-08-17 MED ORDER — IPRATROPIUM-ALBUTEROL 0.5-2.5 (3) MG/3ML IN SOLN
3.0000 mL | RESPIRATORY_TRACT | Status: DC | PRN
  Filled 2018-08-17: qty 3

## 2018-08-17 MED ORDER — ONDANSETRON HCL 4 MG/2ML IJ SOLN
4.0000 mg | Freq: Four times a day (QID) | INTRAMUSCULAR | Status: DC | PRN
  Administered 2018-08-17: 4 mg via INTRAVENOUS
  Filled 2018-08-17: qty 2

## 2018-08-17 MED ORDER — IPRATROPIUM-ALBUTEROL 0.5-2.5 (3) MG/3ML IN SOLN
3.0000 mL | Freq: Three times a day (TID) | RESPIRATORY_TRACT | Status: DC
  Administered 2018-08-17 – 2018-08-18 (×2): 3 mL via RESPIRATORY_TRACT
  Filled 2018-08-17 (×2): qty 3

## 2018-08-17 MED ORDER — IPRATROPIUM-ALBUTEROL 0.5-2.5 (3) MG/3ML IN SOLN
3.0000 mL | Freq: Four times a day (QID) | RESPIRATORY_TRACT | Status: DC
  Administered 2018-08-17 (×2): 3 mL via RESPIRATORY_TRACT
  Filled 2018-08-17: qty 3

## 2018-08-17 MED ORDER — LACTATED RINGERS IV SOLN
INTRAVENOUS | Status: DC
  Administered 2018-08-17 (×2): via INTRAVENOUS

## 2018-08-17 MED ORDER — METHYLPREDNISOLONE SODIUM SUCC 125 MG IJ SOLR
125.0000 mg | Freq: Once | INTRAMUSCULAR | Status: AC
  Administered 2018-08-17: 125 mg via INTRAVENOUS
  Filled 2018-08-17: qty 2

## 2018-08-17 MED ORDER — BISACODYL 5 MG PO TBEC: 5.0000 mg | DELAYED_RELEASE_TABLET | Freq: Every day | ORAL | Status: DC | PRN

## 2018-08-17 MED ORDER — ACETAMINOPHEN 325 MG PO TABS
650.0000 mg | ORAL_TABLET | Freq: Four times a day (QID) | ORAL | Status: DC | PRN
  Administered 2018-08-17: 650 mg via ORAL
  Filled 2018-08-17 (×2): qty 2

## 2018-08-17 MED ORDER — ENOXAPARIN SODIUM 40 MG/0.4ML ~~LOC~~ SOLN
40.0000 mg | SUBCUTANEOUS | Status: DC
  Administered 2018-08-17: 40 mg via SUBCUTANEOUS
  Filled 2018-08-17: qty 0.4

## 2018-08-17 MED ORDER — OXYCODONE HCL 5 MG PO TABS
5.0000 mg | ORAL_TABLET | Freq: Four times a day (QID) | ORAL | Status: DC | PRN
  Administered 2018-08-17 (×2): 5 mg via ORAL
  Filled 2018-08-17 (×2): qty 1

## 2018-08-17 MED ORDER — METHYLPREDNISOLONE SODIUM SUCC 40 MG IJ SOLR
40.0000 mg | Freq: Two times a day (BID) | INTRAMUSCULAR | Status: DC
  Administered 2018-08-17 – 2018-08-18 (×2): 40 mg via INTRAVENOUS
  Filled 2018-08-17 (×2): qty 1

## 2018-08-17 MED ORDER — ONDANSETRON HCL 4 MG PO TABS
4.0000 mg | ORAL_TABLET | Freq: Four times a day (QID) | ORAL | Status: DC | PRN
  Filled 2018-08-17: qty 1

## 2018-08-17 MED ORDER — FENTANYL CITRATE (PF) 100 MCG/2ML IJ SOLN
75.0000 ug | Freq: Once | INTRAMUSCULAR | Status: AC
  Administered 2018-08-17: 75 ug via INTRAVENOUS
  Filled 2018-08-17: qty 2

## 2018-08-17 NOTE — H&P (Signed)
[facility] [place] at [facility]   PATIENT NAME: [name] [name]    MR#:  [id number]  DATE OF BIRTH:  03-15-1971  DATE OF ADMISSION:  08/16/2018  PRIMARY CARE PHYSICIAN: Alba Cory, MD   REQUESTING/REFERRING PHYSICIAN: Arnaldo Natal, MD  CHIEF COMPLAINT:   Chief Complaint  Patient presents with  . Chest Pain    HISTORY OF PRESENT ILLNESS:  [name] [name]  is a 48 y.o. female with a known history of asthma p/w chest tightness, suspected viral pneumonia/pneumonitis/pleuritis + viral sepsis. Pt has committed herself to being angry/irate and difficult well prior to my arrival. I expect this to continue throughout hospitalization. She endorses a 3mo Hx cough, SOB and chest tightness. She surmised her "asthma was acting up," and saw her PCP, who Rx Albuterol + Breo Ellipta inhalers. She filled the former but not the latter. Albuterol has not been helping w/ symptoms. Endorses worsening chest tightness Monday 08/16/2018 @~1630PM while driving to work at [employer]. R arm felt heavy, and pt developed pain in R back below R shoulder blade. Got to work and checked BP, mildly elevated (SBP 140-150). Came to ED. Denies frank CP, sore throat, cough. Endorses SOB and chest tightness/congestion. States chest tightness worse w/ deep inspiration and movement. Endorses numerous sick contacts (works at [employer] and also for [employer] full-time). Denies recent travel. Denies occupation/chemical exposures. Non-smoker, (-) secondhand exposure. Appears non-toxic. SIRS (+), Lactate/PCT low, Flu (-). CTA chest (+) "Mild scattered ground-glass and ill-defined opacities in a basilar and peripheral predominant pattern, likely infectious or inflammatory pneumonitis."  PAST MEDICAL HISTORY:   Past Medical History:  Diagnosis Date  . Allergy   . Arthritis   . Asthma   . Constipation   . Endometriosis    Uses Nuvaring continuously  . Heart murmur   . Ulcer     PAST SURGICAL  HISTORY:   Past Surgical History:  Procedure Laterality Date  . ANKLE SURGERY    . ANKLE SURGERY Right 06/11/2017  . APPENDECTOMY  2008  . CESAREAN SECTION  1995  . CHOLECYSTECTOMY  2000  . LAPAROSCOPIC ENDOMETRIOSIS FULGURATION    . LAPAROSCOPY     for endometriosis  . WISDOM TOOTH EXTRACTION     x2    SOCIAL HISTORY:   Social History   Tobacco Use  . Smoking status: Never Smoker  . Smokeless tobacco: Never Used  Substance Use Topics  . Alcohol use: No    Alcohol/week: 0.0 standard drinks    FAMILY HISTORY:   Family History  Problem Relation Age of Onset  . COPD Mother   . Emphysema Mother   . Cancer Father        unknown  . Cancer Paternal Aunt        ovarian    DRUG ALLERGIES:   Allergies  Allergen Reactions  . Mosquito (Diagnostic) Swelling    REVIEW OF SYSTEMS:   Review of Systems  Constitutional: Positive for chills, fever and malaise/fatigue.  HENT: Positive for congestion. Negative for ear pain, hearing loss, nosebleeds, sinus pain, sore throat and tinnitus.   Eyes: Negative for blurred vision, double vision and photophobia.  Respiratory: Positive for shortness of breath. Negative for cough.   Cardiovascular: Positive for chest pain.  Gastrointestinal: Negative for abdominal pain, heartburn, nausea and vomiting.  Genitourinary: Negative for dysuria.  Musculoskeletal: Positive for back pain. Negative for myalgias.  Skin: Negative for itching and rash.  Neurological: Positive for weakness.  Psychiatric/Behavioral: Negative for  memory loss. The patient does not have insomnia.    MEDICATIONS AT HOME:   Prior to Admission medications   Medication Sig Start Date End Date Taking? Authorizing Provider  albuterol (PROVENTIL HFA;VENTOLIN HFA) 108 (90 Base) MCG/ACT inhaler Inhale 2 puffs into the lungs every 6 (six) hours as needed for wheezing or shortness of breath. 07/15/18  Yes Doren Custard, FNP  cyclobenzaprine (FLEXERIL) 5 MG tablet Take 1 tablet  (5 mg total) by mouth at bedtime. Patient taking differently: Take 5 mg by mouth at bedtime as needed.  07/19/18  Yes Sowles, Danna Hefty, MD  fluticasone (FLONASE) 50 MCG/ACT nasal spray Place 2 sprays into both nostrils daily. 01/01/18  Yes Poulose, Percell Belt, NP  ibuprofen (ADVIL,MOTRIN) 800 MG tablet Take 1 tablet (800 mg total) by mouth every 8 (eight) hours as needed. 05/25/18  Yes Reva Bores, MD  levocetirizine (XYZAL) 5 MG tablet Take 1 tablet (5 mg total) by mouth daily as needed for allergies. Patient taking differently: Take 5 mg by mouth at bedtime.  12/16/17  Yes Lada, Janit Bern, MD  montelukast (SINGULAIR) 10 MG tablet Take 1 tablet (10 mg total) by mouth at bedtime. Patient taking differently: Take 10 mg by mouth daily.  05/19/18  Yes Poulose, Percell Belt, NP  NUVARING 0.12-0.015 MG/24HR vaginal ring Place 1 each vaginally every 28 (twenty-eight) days. Use in continuous fashion for endometriosis 02/25/18  Yes Anyanwu, Jethro Bastos, MD  omeprazole (PRILOSEC) 20 MG capsule Take 1 capsule (20 mg total) by mouth daily. 07/19/18  Yes Sowles, Danna Hefty, MD  triamcinolone cream (KENALOG) 0.1 % Apply to itchy bump bid as needed 05/06/18  Yes [provider]  famotidine (PEPCID) 40 MG tablet Take 1 tablet (40 mg total) by mouth at bedtime. Patient not taking: Reported on 08/16/2018 07/19/18   Alba Cory, MD  fluticasone furoate-vilanterol (BREO ELLIPTA) 100-25 MCG/INH AEPB Inhale 1 puff into the lungs daily. 07/19/18   Alba Cory, MD  norethindrone (AYGESTIN) 5 MG tablet Take 1 tablet (5 mg total) by mouth daily. Patient not taking: Reported on 08/16/2018 05/25/18   Reva Bores, MD  predniSONE (DELTASONE) 20 MG tablet Take 1 tablet (20 mg total) by mouth 2 (two) times daily with a meal. Patient not taking: Reported on 08/16/2018 07/19/18   Alba Cory, MD      VITAL SIGNS:  Blood pressure (!) 122/92, pulse 78, temperature 98 F (36.7 C), temperature source Oral, resp. rate 20,  height 5\' 3"  (1.6 m), weight 68.5 kg, SpO2 99 %.  PHYSICAL EXAMINATION:  Physical Exam  LABORATORY PANEL:   CBC Recent Labs  Lab 08/16/18 1735  WBC 7.1  HGB 15.2*  HCT 46.9*  PLT 387   ------------------------------------------------------------------------------------------------------------------  Chemistries  Recent Labs  Lab 08/16/18 1735  NA 138  K 4.4  CL 107  CO2 25  GLUCOSE 93  BUN 13  CREATININE 0.53  CALCIUM 8.8*  MG 2.5*  AST 22  ALT 21  ALKPHOS 72  BILITOT 0.5   ------------------------------------------------------------------------------------------------------------------  Cardiac Enzymes Recent Labs  Lab 08/17/18 0246  TROPONINI <0.03   ------------------------------------------------------------------------------------------------------------------  RADIOLOGY:  Dg Chest 2 View  Result Date: 08/16/2018 CLINICAL DATA:  Chest pain for the past week. EXAM: CHEST - 2 VIEW COMPARISON:  Chest x-ray dated Dec 16, 2017. FINDINGS: The heart size and mediastinal contours are within normal limits. Both lungs are clear. The visualized skeletal structures are unremarkable. IMPRESSION: No active cardiopulmonary disease. Electronically Signed   By: Obie Dredge  M.D.   On: 08/16/2018 17:58   Ct Angio Chest Pe W And/or Wo Contrast  Result Date: 08/16/2018 CLINICAL DATA:  PE suspected, intermediate prob, positive D-dimer Patient with pleuritic chest pain and extremely high d-dimer. Intermittent right-sided chest pain radiating to the right arm. EXAM: CT ANGIOGRAPHY CHEST WITH CONTRAST TECHNIQUE: Multidetector CT imaging of the chest was performed using the standard protocol during bolus administration of intravenous contrast. Multiplanar CT image reconstructions and MIPs were obtained to evaluate the vascular anatomy. CONTRAST:  75mL OMNIPAQUE IOHEXOL 350 MG/ML SOLN COMPARISON:  Radiograph earlier today. Chest CT 12/23/2010 FINDINGS: Cardiovascular: There are no  filling defects within the pulmonary arteries to suggest pulmonary embolus. Thoracic aorta is normal in caliber without dissection. Common origin of the brachiocephalic and left common carotid artery, normal variant arch anatomy. The heart is normal in size. No pericardial effusion. Mediastinum/Nodes: Small bilateral hilar and mediastinal nodes, all subcentimeter short axis and likely reactive. The esophagus is decompressed. No visualized thyroid nodule. Lungs/Pleura: Mild scattered ground-glass and ill-defined opacities in a basilar and peripheral predominant pattern. No consolidative airspace disease. Minimal subpleural scarring in the right upper lobe. No pulmonary edema or pleural effusion. No pulmonary mass. The trachea and mainstem bronchi are patent. Upper Abdomen: No acute abnormality. Musculoskeletal: There are no acute or suspicious osseous abnormalities. Review of the MIP images confirms the above findings. IMPRESSION: 1. No pulmonary embolus. 2. Mild scattered ground-glass and ill-defined opacities in a basilar and peripheral predominant pattern, likely infectious or inflammatory pneumonitis. Electronically Signed   By: Narda RutherfordMelanie  Sanford M.D.   On: 08/16/2018 19:17   IMPRESSION AND PLAN:   A/P: 16F w/ PMHx asthma p/w chest tightness, suspected viral pneumonia/pneumonitis/pleuritis + viral sepsis. Hypocalcemia, hypermagnesemia, polycythemia, D-Dimer elevation. -Chest tightness, suspected viral pneumonia/pneumonitis/pleuritis + viral sepsis: Appears non-toxic. SIRS (+), Lactate/PCT low, Flu (-). CTA chest (+) "Mild scattered ground-glass and ill-defined opacities in a basilar and peripheral predominant pattern, likely infectious or inflammatory pneumonitis." D-Dimer elevated, non-specific, acute phase reactant. Denies occupation/chemical exposures. Non-smoker, (-) secondhand exposure. Viral panel pending. Not started on ABx. Steroids, nebs, IS, pulmonary toileting. Pericarditis unlikely, Trop/EKG (-);  Echo pending nonetheless. Symptomatic mgmt, pain ctrl. -Hypocalcemia, hypermagnesemia, polycythemia: Dehydrated. IVF. Ionized calcium. -c/w home meds/formulary subs. -FEN/GI: Regular diet. -DVT PPx: Lovenox. -Code status: Full code. -Disposition: Observation, < 2 midnights.   All the records are reviewed and case discussed with ED provider. Management plans discussed with the patient, family and they are in agreement.  CODE STATUS: Full code.  TOTAL TIME TAKING CARE OF THIS PATIENT: 75 minutes.    Barbaraann RondoPrasanna Sidonie Dexheimer M.D on 08/17/2018 at 9:21 AM  Between 7am to 6pm - Pager - [phone]  After 6pm go to [email or web address] - Scientist, research (life sciences)password [email or web address]  [facility]  Office  [phone]  CC: Primary care physician; Alba CorySowles, Krichna, MD   Note: This dictation was prepared with Dragon dictation along with smaller phrase technology. Any transcriptional errors that result from this process are unintentional.

## 2018-08-17 NOTE — ED Notes (Signed)
Pt has moved to room 33 from main ED, awaiting bed placementPt ambulatory to BR without difficulty or distress noted; returned to room; assisted into hosp gown & on card monitor; pt denies c/o at present & voices good understanding of plan of care

## 2018-08-17 NOTE — Progress Notes (Signed)
Patient ID: [name] [name], female   DOB: 08-18-1970, 48 y.o.   MRN: [id number]  [facility] PROGRESS NOTE  [name] [name] UTM:[id number] DOB: July 14, 1971 DOA: 08/16/2018 PCP: Alba Cory, MD  HPI/Subjective: Patient complains of right-sided chest pain described as a constant pressure.  She went to her doctor got her asthma inhalers refilled because she thought it was her asthma.  She states her blood pressure was up and her right arm was hurting.  In the ER her flu swab was negative but had a CAT scan of the chest that showed scattered groundglass opacities basilar and peripherally likely infectious or inflammatory pneumonitis  Objective: Vitals:   08/17/18 0527 08/17/18 0724  BP: (!) 145/71 (!) 122/92  Pulse: 75 78  Resp: 20   Temp: 98.5 F (36.9 C) 98 F (36.7 C)  SpO2: 99% 99%    Filed Weights   08/16/18 1722 08/17/18 0546  Weight: 68 kg 68.5 kg    ROS: Review of Systems  Constitutional: Negative for chills and fever.  Eyes: Negative for blurred vision.  Respiratory: Positive for cough, shortness of breath and wheezing.   Cardiovascular: Positive for chest pain.  Gastrointestinal: Negative for abdominal pain, constipation, diarrhea, nausea and vomiting.  Genitourinary: Negative for dysuria.  Musculoskeletal: Negative for joint pain.  Neurological: Negative for dizziness and headaches.   Exam: Physical Exam  Constitutional: She is oriented to person, place, and time.  HENT:  Nose: No mucosal edema.  Mouth/Throat: No oropharyngeal exudate or posterior oropharyngeal edema.  Eyes: Pupils are equal, round, and reactive to light. Conjunctivae, EOM and lids are normal.  Neck: No JVD present. Carotid bruit is not present. No edema present. No thyroid mass and no thyromegaly present.  Cardiovascular: S1 normal and S2 normal. Exam reveals no gallop.  No murmur heard. Pulses:      Dorsalis pedis pulses are 2+ on the right side and 2+ on the left side.  Respiratory: No  respiratory distress. She has decreased breath sounds in the right middle field, the right lower field, the left middle field and the left lower field. She has wheezes in the right middle field and the left middle field. She has rhonchi in the right lower field and the left lower field. She has no rales.  GI: Soft. Bowel sounds are normal. There is no abdominal tenderness.  Musculoskeletal:     Right ankle: She exhibits no swelling.     Left ankle: She exhibits no swelling.  Lymphadenopathy:    She has no cervical adenopathy.  Neurological: She is alert and oriented to person, place, and time. No cranial nerve deficit.  Skin: Skin is warm. No rash noted. Nails show no clubbing.  Psychiatric: She has a normal mood and affect.      Data Reviewed: Basic Metabolic Panel: Recent Labs  Lab 08/16/18 1735  NA 138  K 4.4  CL 107  CO2 25  GLUCOSE 93  BUN 13  CREATININE 0.53  CALCIUM 8.8*  MG 2.5*  PHOS 3.8   Liver Function Tests: Recent Labs  Lab 08/16/18 1735  AST 22  ALT 21  ALKPHOS 72  BILITOT 0.5  PROT 7.1  ALBUMIN 3.6   CBC: Recent Labs  Lab 08/16/18 1735  WBC 7.1  HGB 15.2*  HCT 46.9*  MCV 87.7  PLT 387   Cardiac Enzymes: Recent Labs  Lab 08/16/18 1735 08/17/18 0246  TROPONINI <0.03 <0.03     Recent Results (from the past 240 hour(s))  Respiratory  Panel by PCR     Status: None   Collection Time: 08/17/18  2:50 AM  Result Value Ref Range Status   Adenovirus NOT DETECTED NOT DETECTED Final   Coronavirus 229E NOT DETECTED NOT DETECTED Final   Coronavirus HKU1 NOT DETECTED NOT DETECTED Final   Coronavirus NL63 NOT DETECTED NOT DETECTED Final   Coronavirus OC43 NOT DETECTED NOT DETECTED Final   Metapneumovirus NOT DETECTED NOT DETECTED Final   Rhinovirus / Enterovirus NOT DETECTED NOT DETECTED Final   Influenza A NOT DETECTED NOT DETECTED Final   Influenza B NOT DETECTED NOT DETECTED Final   Parainfluenza Virus 1 NOT DETECTED NOT DETECTED Final    Parainfluenza Virus 2 NOT DETECTED NOT DETECTED Final   Parainfluenza Virus 3 NOT DETECTED NOT DETECTED Final   Parainfluenza Virus 4 NOT DETECTED NOT DETECTED Final   Respiratory Syncytial Virus NOT DETECTED NOT DETECTED Final   Bordetella pertussis NOT DETECTED NOT DETECTED Final   Chlamydophila pneumoniae NOT DETECTED NOT DETECTED Final   Mycoplasma pneumoniae NOT DETECTED NOT DETECTED Final    Comment: Performed at [facility], [address] [place], [place] [address]     Studies: Dg Chest 2 View  Result Date: 08/16/2018 CLINICAL DATA:  Chest pain for the past week. EXAM: CHEST - 2 VIEW COMPARISON:  Chest x-ray dated Dec 16, 2017. FINDINGS: The heart size and mediastinal contours are within normal limits. Both lungs are clear. The visualized skeletal structures are unremarkable. IMPRESSION: No active cardiopulmonary disease. Electronically Signed   By: Obie DredgeWilliam T Derry M.D.   On: 08/16/2018 17:58   Ct Angio Chest Pe W And/or Wo Contrast  Result Date: 08/16/2018 CLINICAL DATA:  PE suspected, intermediate prob, positive D-dimer Patient with pleuritic chest pain and extremely high d-dimer. Intermittent right-sided chest pain radiating to the right arm. EXAM: CT ANGIOGRAPHY CHEST WITH CONTRAST TECHNIQUE: Multidetector CT imaging of the chest was performed using the standard protocol during bolus administration of intravenous contrast. Multiplanar CT image reconstructions and MIPs were obtained to evaluate the vascular anatomy. CONTRAST:  75mL OMNIPAQUE IOHEXOL 350 MG/ML SOLN COMPARISON:  Radiograph earlier today. Chest CT 12/23/2010 FINDINGS: Cardiovascular: There are no filling defects within the pulmonary arteries to suggest pulmonary embolus. Thoracic aorta is normal in caliber without dissection. Common origin of the brachiocephalic and left common carotid artery, normal variant arch anatomy. The heart is normal in size. No pericardial effusion. Mediastinum/Nodes: Small bilateral hilar and  mediastinal nodes, all subcentimeter short axis and likely reactive. The esophagus is decompressed. No visualized thyroid nodule. Lungs/Pleura: Mild scattered ground-glass and ill-defined opacities in a basilar and peripheral predominant pattern. No consolidative airspace disease. Minimal subpleural scarring in the right upper lobe. No pulmonary edema or pleural effusion. No pulmonary mass. The trachea and mainstem bronchi are patent. Upper Abdomen: No acute abnormality. Musculoskeletal: There are no acute or suspicious osseous abnormalities. Review of the MIP images confirms the above findings. IMPRESSION: 1. No pulmonary embolus. 2. Mild scattered ground-glass and ill-defined opacities in a basilar and peripheral predominant pattern, likely infectious or inflammatory pneumonitis. Electronically Signed   By: Narda RutherfordMelanie  Sanford M.D.   On: 08/16/2018 19:17   [place] Venous Img Lower Bilateral  Result Date: 08/17/2018 CLINICAL DATA:  Intermittent swelling x years EXAM: BILATERAL LOWER EXTREMITY VENOUS DOPPLER ULTRASOUND TECHNIQUE: Gray-scale sonography with compression, as well as color and duplex ultrasound, were performed to evaluate the deep venous system from the level of the common femoral vein through the popliteal and proximal calf veins.  COMPARISON:  None FINDINGS: Normal compressibility of the common femoral, superficial femoral, and popliteal veins, as well as the proximal calf veins. No filling defects to suggest DVT on grayscale or color Doppler imaging. Doppler waveforms show normal direction of venous flow, normal respiratory phasicity and response to augmentation. IMPRESSION: No femoropopliteal and no calf DVT in the visualized calf veins. If clinical symptoms are inconsistent or if there are persistent or worsening symptoms, further imaging (possibly involving the iliac veins) may be warranted. Electronically Signed   By: Corlis Leak M.D.   On: 08/17/2018 12:00    Scheduled Meds: . [START ON 08/18/2018]  azithromycin  250 mg Oral Daily  . enoxaparin (LOVENOX) injection  40 mg Subcutaneous Q24H  . fluticasone  1 spray Each Nare QHS  . fluticasone furoate-vilanterol  1 puff Inhalation Daily  . ipratropium-albuterol  3 mL Nebulization Q6H  . methylPREDNISolone (SOLU-MEDROL) injection  40 mg Intravenous Q12H  . montelukast  10 mg Oral Daily  . pantoprazole  40 mg Oral Daily   Continuous Infusions: . sodium chloride 50 mL/hr at 08/17/18 1314  . cefTRIAXone (ROCEPHIN)  IV 1 g (08/17/18 1438)    Assessment/Plan:  1. Atypical pneumonia.  Started on Rocephin and Zithromax.  Flu swab and viral respiratory panel negative. 2. Pleuritic chest pain.  Started on Solu-Medrol.  Continue inhalers and nebulizer treatments. 3. Asthma exacerbation. nebulizers, steroids and inhaler should help. 4. GERD on PPI 5. Elevated fibrin derivatives.  CT scan of the chest negative for PE.  Ultrasound of the lower extremities negative for DVT.  Code Status:     Code Status Orders  (From admission, onward)         Start     Ordered   08/17/18 0322  Full code  Continuous     08/17/18 0321        Code Status History    This patient has a current code status but no historical code status.     Family Communication: Spoke with fianc on the phone disposition Plan: Reevaluate tomorrow for potential disposition  Antibiotics:  Rocephin  Zithromax  Time spent: 35 minutes  [name] [name] [employer]

## 2018-08-18 ENCOUNTER — Telehealth: Payer: Self-pay

## 2018-08-18 LAB — CALCIUM, IONIZED: Calcium, Ionized, Serum: 5.1 mg/dL (ref 4.5–5.6)

## 2018-08-18 LAB — ECHOCARDIOGRAM COMPLETE
HEIGHTINCHES: 63 in
Weight: 2416 oz

## 2018-08-18 MED ORDER — LEVOFLOXACIN 500 MG PO TABS: 500.0000 mg | ORAL_TABLET | Freq: Every day | ORAL | 0 refills | Status: DC

## 2018-08-18 MED ORDER — FLUCONAZOLE 200 MG PO TABS: 200.0000 mg | ORAL_TABLET | Freq: Every day | ORAL | 0 refills | Status: AC

## 2018-08-18 MED ORDER — HYDROCOD POLST-CPM POLST ER 10-8 MG/5ML PO SUER
5.0000 mL | Freq: Two times a day (BID) | ORAL | Status: DC | PRN
  Administered 2018-08-18: 5 mL via ORAL
  Filled 2018-08-18: qty 5

## 2018-08-18 MED ORDER — PREDNISONE 10 MG (21) PO TBPK: ORAL_TABLET | ORAL | 0 refills | Status: DC

## 2018-08-18 MED ORDER — HYDROCOD POLST-CPM POLST ER 10-8 MG/5ML PO SUER: 5.0000 mL | Freq: Two times a day (BID) | ORAL | 0 refills | Status: DC | PRN

## 2018-08-18 MED ORDER — PREDNISONE 50 MG PO TABS
50.0000 mg | ORAL_TABLET | Freq: Every day | ORAL | Status: DC
  Administered 2018-08-18: 50 mg via ORAL
  Filled 2018-08-18: qty 1

## 2018-08-18 NOTE — Telephone Encounter (Signed)
What about next Thursday?

## 2018-08-18 NOTE — Telephone Encounter (Signed)
Copied from CRM [id number]. Topic: Appointment Scheduling - Scheduling Inquiry for Clinic >> Aug 18, 2018 11:50 AM Leafy Ro wrote: Reason for CRM: [facility] is calling and pt needs post hosp follow up in one week with dr Carlynn Purl. Pt will be discharged today. Please call pt

## 2018-08-18 NOTE — Telephone Encounter (Signed)
Please get with Dr Carlynn Purl and look at the schedule and let me know where to put this HFU. Thank You

## 2018-08-18 NOTE — Discharge Instructions (Signed)
Resume diet and activity as before ° ° °

## 2018-08-19 NOTE — Telephone Encounter (Signed)
Is 12:00pm ok for next Thursday?

## 2018-08-23 ENCOUNTER — Encounter: Payer: Self-pay | Admitting: Family Medicine

## 2018-08-23 ENCOUNTER — Ambulatory Visit: Payer: Managed Care, Other (non HMO) | Admitting: Family Medicine

## 2018-08-23 VITALS — BP 120/86 | HR 106 | Temp 98.2°F | Resp 16 | Ht 63.0 in | Wt 148.4 lb

## 2018-08-23 DIAGNOSIS — J4541 Moderate persistent asthma with (acute) exacerbation: Secondary | ICD-10-CM

## 2018-08-23 DIAGNOSIS — J129 Viral pneumonia, unspecified: Secondary | ICD-10-CM

## 2018-08-23 DIAGNOSIS — Z09 Encounter for follow-up examination after completed treatment for conditions other than malignant neoplasm: Secondary | ICD-10-CM

## 2018-08-23 DIAGNOSIS — I808 Phlebitis and thrombophlebitis of other sites: Secondary | ICD-10-CM

## 2018-08-23 MED ORDER — ASPIRIN 325 MG PO TBEC: 325.0000 mg | DELAYED_RELEASE_TABLET | Freq: Two times a day (BID) | ORAL | 0 refills | Status: DC

## 2018-08-23 NOTE — Progress Notes (Signed)
Name: [name] [name]   MRN: [id number]    DOB: [birth year]   Date:08/23/2018       Progress Note  Subjective  Chief Complaint  Chief Complaint  Patient presents with  . Shortness of Breath  . Hot Flashes  . Bleeding/Bruising    from IV on right arm is spreading upward.    HPI  Hospital Discharge F/U: she came in for a visit 12/09 with some SOB , cough and nasal congestion, she resumed Breo but symptoms never improved, she had palpitation and had bp checked at work on 01/06 and went to [facility] since she was having chest tightness worsening of SOB and bp was elevated - she states 173/94. At [facility] she had elevated D-dimer, negative doppler legs, CT angiogram negative for PE but showed ground glass appearance suggestive of infection/pneumonitis. She was admitted and given IV antibiotics, steroids and was on nebulizer treatment. She improved and was sent home on 08/18/2018, she continues to have a dry cough, post-nasal drainage, intermittent wheezing and chest tightness. She has been taking allergy medication as prescribed. She also noticed site of IV is red and tender and seems to be expanding.    Patient Active Problem List   Diagnosis Date Noted  . Chest pain with low risk for cardiac etiology 08/17/2018  . Asthma, intermittent with acute exacerbation 07/19/2018  . 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 Post-traumatic osteoarthritis of right foot 09/08/2016  . Endometriosis of pelvis 01/05/2013    Past Surgical History:  Procedure Laterality Date  . ANKLE SURGERY    . ANKLE SURGERY Right 06/11/2017  . APPENDECTOMY  2008  . CESAREAN SECTION  1995  . CHOLECYSTECTOMY  2000  . LAPAROSCOPIC ENDOMETRIOSIS FULGURATION    . LAPAROSCOPY     for endometriosis  . WISDOM TOOTH EXTRACTION     x2    Family History  Problem Relation Age of Onset  . COPD Mother   . Emphysema Mother   . Cancer Father        unknown  . Cancer Paternal Aunt        ovarian     Social History   Socioeconomic History  . Marital status: Single    Spouse name: Not on file  . Number of children: 1  . Years of education: Not on file  . Highest education level: Not on file  Occupational History  . Not on file  Social Needs  . Financial resource strain: Not hard at all  . Food insecurity:    Worry: Never true    Inability: Never true  . Transportation needs:    Medical: No    Non-medical: No  Tobacco Use  . Smoking status: Never Smoker  . Smokeless tobacco: Never Used  Substance and Sexual Activity  . Alcohol use: No    Alcohol/week: 0.0 standard drinks  . Drug use: No  . Sexual activity: Yes    Birth control/protection: Inserts    Comment: Nuva ring  Lifestyle  . Physical activity:    Days per week: 0 days    Minutes per session: 0 min  . Stress: Only a little  Relationships  . Social connections:    Talks on phone: More than three times a week    Gets together: More than three times a week    Attends religious service: More than 4 times per year    Active member of club or organization: No    Attends meetings  of clubs or organizations: Never    Relationship status: Never married  . Intimate partner violence:    Fear of current or ex partner: No    Emotionally abused: No    Physically abused: No    Forced sexual activity: No  Other Topics Concern  . Not on file  Social History Narrative  . Not on file     Current Outpatient Medications:  .  albuterol (PROVENTIL HFA;VENTOLIN HFA) 108 (90 Base) MCG/ACT inhaler, Inhale 2 puffs into the lungs every 6 (six) hours as needed for wheezing or shortness of breath., Disp: 1 Inhaler, Rfl: 0 .  chlorpheniramine-HYDROcodone (TUSSIONEX PENNKINETIC ER) 10-8 MG/5ML SUER, Take 5 mLs by mouth every 12 (twelve) hours as needed for cough., Disp: 140 mL, Rfl: 0 .  cyclobenzaprine (FLEXERIL) 5 MG tablet, Take 1 tablet (5 mg total) by mouth at bedtime. (Patient taking differently: Take 5 mg by mouth at  bedtime as needed. ), Disp: 30 tablet, Rfl: 0 .  famotidine (PEPCID) 40 MG tablet, Take 1 tablet (40 mg total) by mouth at bedtime., Disp: 90 tablet, Rfl: 1 .  fluticasone (FLONASE) 50 MCG/ACT nasal spray, Place 2 sprays into both nostrils daily., Disp: 16 g, Rfl: 6 .  fluticasone furoate-vilanterol (BREO ELLIPTA) 100-25 MCG/INH AEPB, Inhale 1 puff into the lungs daily., Disp: 60 each, Rfl: 0 .  ibuprofen (ADVIL,MOTRIN) 800 MG tablet, Take 1 tablet (800 mg total) by mouth every 8 (eight) hours as needed., Disp: 60 tablet, Rfl: 1 .  levocetirizine (XYZAL) 5 MG tablet, Take 1 tablet (5 mg total) by mouth daily as needed for allergies. (Patient taking differently: Take 5 mg by mouth at bedtime. ), Disp: 30 tablet, Rfl: 11 .  montelukast (SINGULAIR) 10 MG tablet, Take 1 tablet (10 mg total) by mouth at bedtime. (Patient taking differently: Take 10 mg by mouth daily. ), Disp: 90 tablet, Rfl: 1 .  norethindrone (AYGESTIN) 5 MG tablet, Take 1 tablet (5 mg total) by mouth daily., Disp: 60 tablet, Rfl: 2 .  NUVARING 0.12-0.015 MG/24HR vaginal ring, Place 1 each vaginally every 28 (twenty-eight) days. Use in continuous fashion for endometriosis, Disp: 3 each, Rfl: 6 .  omeprazole (PRILOSEC) 20 MG capsule, Take 1 capsule (20 mg total) by mouth daily., Disp: 90 capsule, Rfl: 1 .  predniSONE (STERAPRED UNI-PAK 21 TAB) 10 MG (21) TBPK tablet, 6 tabs day 1 and taper 10 mg a day - 6 days, Disp: 21 tablet, Rfl: 0 .  triamcinolone cream (KENALOG) 0.1 %, Apply to itchy bump bid as needed, Disp: , Rfl:  .  aspirin EC 325 MG EC tablet, Take 1 tablet (325 mg total) by mouth 2 (two) times daily., Disp: 60 tablet, Rfl: 0 .  levofloxacin (LEVAQUIN) 500 MG tablet, Take 1 tablet (500 mg total) by mouth daily. (Patient not taking: Reported on 08/23/2018), Disp: 5 tablet, Rfl: 0  Allergies  Allergen Reactions  . Mosquito (Diagnostic) Swelling    I personally reviewed active problem list, medication list, allergies, family  history, social history with the patient/caregiver today.   ROS  Constitutional: Negative for fever or weight change.  Respiratory: Positive  for cough and shortness of breath.   Cardiovascular: Positive  for chest pain but no palpitations.  Gastrointestinal: Negative for abdominal pain, no bowel changes.  Musculoskeletal: Negative for gait problem or joint swelling.  Skin: Negative for rash.  Neurological: Positive  for dizziness but no  headache.  No other specific complaints in a complete review of  systems (except as listed in HPI above).  Objective  Vitals:   08/23/18 1439  BP: 120/86  Pulse: (!) 106  Resp: 16  Temp: 98.2 F (36.8 C)  TempSrc: Oral  SpO2: 98%  Weight: 148 lb 6.4 oz (67.3 kg)  Height: 5\' 3"  (1.6 m)    Body mass index is 26.29 kg/m.  Physical Exam  Constitutional: Patient appears well-developed and well-nourished.  No distress.  HEENT: head atraumatic, normocephalic, pupils equal and reactive to light,neck supple, throat within normal limits Cardiovascular: Normal rate, regular rhythm and normal heart sounds.  No murmur heard. No BLE edema. Pulmonary/Chest: Effort normal and breath sounds normal. No respiratory distress. Abdominal: Soft.  There is no tenderness. Psychiatric: Patient has a normal mood and affect. behavior is normal. Judgment and thought content normal.  Recent Results (from the past 2160 hour(s))  Basic metabolic panel     Status: Abnormal   Collection Time: 08/16/18  5:35 PM  Result Value Ref Range   Sodium 138 135 - 145 mmol/L   Potassium 4.4 3.5 - 5.1 mmol/L   Chloride 107 98 - 111 mmol/L   CO2 25 22 - 32 mmol/L   Glucose, Bld 93 70 - 99 mg/dL   BUN 13 6 - 20 mg/dL   Creatinine, Ser 1.610.53 0.44 - 1.00 mg/dL   Calcium 8.8 (L) 8.9 - 10.3 mg/dL   GFR calc non Af Amer >60 >60 mL/min   GFR calc Af Amer >60 >60 mL/min   Anion gap 6 5 - 15    Comment: Performed at [facility], [address] [place], [place] [address]   CBC     Status: Abnormal   Collection Time: 08/16/18  5:35 PM  Result Value Ref Range   WBC 7.1 4.0 - 10.5 K/uL   RBC 5.35 (H) 3.87 - 5.11 MIL/uL   Hemoglobin 15.2 (H) 12.0 - 15.0 g/dL   HCT 54.046.9 (H) 98.136.0 - 19.146.0 %   MCV 87.7 80.0 - 100.0 fL   MCH 28.4 26.0 - 34.0 pg   MCHC 32.4 30.0 - 36.0 g/dL   RDW 47.813.8 29.511.5 - 62.115.5 %   Platelets 387 150 - 400 K/uL   nRBC 0.0 0.0 - 0.2 %    Comment: Performed at [facility], [address] [place], [place] [address]  Troponin I - ONCE - STAT     Status: None   Collection Time: 08/16/18  5:35 PM  Result Value Ref Range   Troponin I <0.03 <0.03 ng/mL    Comment: Performed at [facility], [address] [place], [place] [address]  Fibrin derivatives D-D[facility] only)     Status: Abnormal   Collection Time: 08/16/18  5:35 PM  Result Value Ref Range   Fibrin derivatives D-dimer ([facility]) 5,420.53 (H) 0.00 - 499.00 ng/mL (FEU)    Comment: (NOTE) <> Exclusion of Venous Thromboembolism (VTE) - OUTPATIENT ONLY   (Emergency Department or [place])   0-499 ng/ml (FEU): With a low to intermediate pretest probability                      for VTE this test result excludes the diagnosis                      of VTE.   >499 ng/ml (FEU) : VTE not excluded; additional work up for VTE is  required. <> Testing on Inpatients and Evaluation of Disseminated Intravascular   Coagulation (DIC) Reference Range:   0-499 ng/ml (FEU) Performed at [facility], [address] [place], [place] [address]   Hepatic function panel     Status: None   Collection Time: 08/16/18  5:35 PM  Result Value Ref Range   Total Protein 7.1 6.5 - 8.1 g/dL   Albumin 3.6 3.5 - 5.0 g/dL   AST 22 15 - 41 U/L   ALT 21 0 - 44 U/L   Alkaline Phosphatase 72 38 - 126 U/L   Total Bilirubin 0.5 0.3 - 1.2 mg/dL   Bilirubin, Direct <4.4 0.0 - 0.2 mg/dL   Indirect Bilirubin NOT CALCULATED 0.3 - 0.9 mg/dL    Comment: Performed at [facility], [address] [place], [place] [address]  Magnesium     Status: Abnormal   Collection Time: 08/16/18  5:35 PM  Result Value Ref Range   Magnesium 2.5 (H) 1.7 - 2.4 mg/dL    Comment: Performed at [facility], [address] [place], [place] [address]  Phosphorus     Status: None   Collection Time: 08/16/18  5:35 PM  Result Value Ref Range   Phosphorus 3.8 2.5 - 4.6 mg/dL    Comment: Performed at [facility], [address] [place], [place] [address]  Pregnancy, urine     Status: None   Collection Time: 08/16/18  8:53 PM  Result Value Ref Range   Preg Test, Ur NEGATIVE NEGATIVE    Comment: Performed at [facility], [address] [place], [place] [address]  Pregnancy, urine POC     Status: None   Collection Time: 08/16/18  9:45 PM  Result Value Ref Range   Preg Test, Ur NEGATIVE NEGATIVE    Comment:        THE SENSITIVITY OF THIS METHODOLOGY IS >24 mIU/mL   Troponin I - Once     Status: None   Collection Time: 08/17/18  2:46 AM  Result Value Ref Range   Troponin I <0.03 <0.03 ng/mL    Comment: Performed at [facility], [address] [place], [place] [address]  Respiratory Panel by PCR     Status: None   Collection Time: 08/17/18  2:50 AM  Result Value Ref Range   Adenovirus NOT DETECTED NOT DETECTED   Coronavirus 229E NOT DETECTED NOT DETECTED   Coronavirus HKU1 NOT DETECTED NOT DETECTED   Coronavirus NL63 NOT DETECTED NOT DETECTED   Coronavirus OC43 NOT DETECTED NOT DETECTED   Metapneumovirus NOT DETECTED NOT DETECTED   Rhinovirus / Enterovirus NOT DETECTED NOT DETECTED   Influenza A NOT DETECTED NOT DETECTED   Influenza B NOT DETECTED NOT DETECTED   Parainfluenza Virus 1 NOT DETECTED NOT DETECTED   Parainfluenza Virus 2 NOT DETECTED NOT DETECTED   Parainfluenza Virus 3 NOT DETECTED NOT DETECTED   Parainfluenza Virus 4 NOT DETECTED NOT DETECTED   Respiratory Syncytial Virus NOT DETECTED NOT DETECTED    Bordetella pertussis NOT DETECTED NOT DETECTED   Chlamydophila pneumoniae NOT DETECTED NOT DETECTED   Mycoplasma pneumoniae NOT DETECTED NOT DETECTED    Comment: Performed at [facility], [address] [place], [place] [address]  Influenza panel by PCR (type A & B)     Status: None   Collection Time: 08/17/18  2:50 AM  Result Value Ref Range   Influenza A By PCR NEGATIVE NEGATIVE   Influenza B By  PCR NEGATIVE NEGATIVE    Comment: (NOTE) The Xpert Xpress Flu assay is intended as an aid in the diagnosis of  influenza and should not be used as a sole basis for treatment.  This  assay is FDA approved for nasopharyngeal swab specimens only. Nasal  washings and aspirates are unacceptable for Xpert Xpress Flu testing. Performed at [facility], [address] [place], [place] [address]   Lactic acid, plasma     Status: None   Collection Time: 08/17/18  4:40 AM  Result Value Ref Range   Lactic Acid, Venous 1.1 0.5 - 1.9 mmol/L    Comment: Performed at [facility], [address] [place], [place] [address]  Procalcitonin - Baseline     Status: None   Collection Time: 08/17/18  4:40 AM  Result Value Ref Range   Procalcitonin <0.10 ng/mL    Comment:        Interpretation: PCT (Procalcitonin) <= 0.5 ng/mL: Systemic infection (sepsis) is not likely. Local bacterial infection is possible. (NOTE)       Sepsis PCT Algorithm           Lower Respiratory Tract                                      Infection PCT Algorithm    ----------------------------     ----------------------------         PCT < 0.25 ng/mL                PCT < 0.10 ng/mL         Strongly encourage             Strongly discourage   discontinuation of antibiotics    initiation of antibiotics    ----------------------------     -----------------------------       PCT 0.25 - 0.50 ng/mL            PCT 0.10 - 0.25 ng/mL               OR       >80% decrease in PCT            Discourage initiation of                                             antibiotics      Encourage discontinuation           of antibiotics    ----------------------------     -----------------------------         PCT >= 0.50 ng/mL              PCT 0.26 - 0.50 ng/mL               AND        <80% decrease in PCT             Encourage initiation of                                             antibiotics       Encourage continuation           of antibiotics    ----------------------------     -----------------------------  PCT >= 0.50 ng/mL                  PCT > 0.50 ng/mL               AND         increase in PCT                  Strongly encourage                                      initiation of antibiotics    Strongly encourage escalation           of antibiotics                                     -----------------------------                                           PCT <= 0.25 ng/mL                                                 OR                                        > 80% decrease in PCT                                     Discontinue / Do not initiate                                             antibiotics Performed at [facility], [address] [place], [place] [address]   Calcium, ionized     Status: None   Collection Time: 08/17/18  4:40 AM  Result Value Ref Range   Calcium, Ionized, Serum 5.1 4.5 - 5.6 mg/dL    Comment: (NOTE) Performed At: [facility] [address] [place], [place] [id number] Jolene Schimke MD NG:[id number]   ECHOCARDIOGRAM COMPLETE     Status: None   Collection Time: 08/17/18  8:11 PM  Result Value Ref Range   Weight 2,416 oz   Height 63 in   BP 126/78 mmHg      PHQ2/9: Depression screen [facility] 2/9 05/06/2018 12/16/2017 11/10/2017  Decreased Interest 0 0 0  Down, Depressed, Hopeless 0 0 0  PHQ - 2 Score 0 0 0  Altered sleeping 0 - -  Tired, decreased energy 0 - -  Change in appetite 0 - -  Feeling bad or failure about yourself  0 - -   Trouble concentrating 0 - -  Moving slowly or fidgety/restless 0 - -  Suicidal thoughts 0 - -  PHQ-9 Score 0 - -  Difficult doing work/chores Not difficult at all - -    Fall Risk:  Fall Risk  07/19/2018 05/06/2018 12/16/2017 11/10/2017  Falls in the past year? 0 Yes No No  Number falls in past yr: 0 1 - -  Injury with Fall? - No - -     Assessment & Plan  1. Hospital discharge follow-up   2. Phlebitis of right arm  - aspirin EC 325 MG EC tablet; Take 1 tablet (325 mg total) by mouth 2 (two) times daily.  Dispense: 60 tablet; Refill: 0  3. Moderate persistent asthma with allergic rhinitis with acute exacerbation  - Ambulatory referral to Pulmonology  4. Viral pneumonitis  - Ambulatory referral to Pulmonology

## 2018-08-24 NOTE — Discharge Summary (Signed)
[facility] [place] [facility]   PATIENT NAME: [name] BarnacleJessie Mapp    MR#:  [id number]  DATE OF BIRTH:  May 23, 1971  DATE OF ADMISSION:  08/16/2018 ADMITTING PHYSICIAN: Barbaraann RondoPrasanna Sridharan, MD  DATE OF DISCHARGE: 08/18/2018  7:00 PM  PRIMARY CARE PHYSICIAN: Alba CorySowles, Krichna, MD   ADMISSION DIAGNOSIS:  Pneumonitis [J18.9] Chest pain, unspecified type [R07.9]  DISCHARGE DIAGNOSIS:  Active Problems:   Chest pain with low risk for cardiac etiology   SECONDARY DIAGNOSIS:   Past Medical History:  Diagnosis Date  . Allergy   . Arthritis   . Asthma   . Constipation   . Endometriosis    Uses Nuvaring continuously  . Heart murmur   . Ulcer      ADMITTING HISTORY  Patient complains of right-sided chest pain described as a constant pressure.  She went to her doctor got her asthma inhalers refilled because she thought it was her asthma.  She states her blood pressure was up and her right arm was hurting.  In the ER her flu swab was negative but had a CAT scan of the chest that showed scattered groundglass opacities basilar and peripherally likely infectious or inflammatory pneumonitis  HOSPITAL COURSE:   *Acute bronchitis/atypical pneumonia.  Treated with Rocephin and Zithromax during hospital stay.  Started on steroids.  Nebulizers, inhalers.  Patient slowly improved and by the day of discharge she is close to normal.  Very minimal wheezing.  Ambulating without any problem.  Not needing oxygen.  Will be discharged home on prednisone taper and Levaquin.  Inhaler.  Follow-up with primary care physician as outpatient in a week.  Stable for discharge home  CONSULTS OBTAINED:  Treatment Team:  Barbaraann RondoSridharan, Prasanna, MD  DRUG ALLERGIES:   Allergies  Allergen Reactions  . Mosquito (Diagnostic) Swelling    DISCHARGE MEDICATIONS:   Allergies as of 08/18/2018      Reactions   Mosquito (diagnostic) Swelling      Medication List    STOP taking these medications    predniSONE 20 MG tablet Commonly known as:  DELTASONE Replaced by:  predniSONE 10 MG (21) Tbpk tablet     TAKE these medications   albuterol 108 (90 Base) MCG/ACT inhaler Commonly known as:  PROVENTIL HFA;VENTOLIN HFA Inhale 2 puffs into the lungs every 6 (six) hours as needed for wheezing or shortness of breath.   chlorpheniramine-HYDROcodone 10-8 MG/5ML Suer Commonly known as:  TUSSIONEX PENNKINETIC ER Take 5 mLs by mouth every 12 (twelve) hours as needed for cough.   cyclobenzaprine 5 MG tablet Commonly known as:  FLEXERIL Take 1 tablet (5 mg total) by mouth at bedtime. What changed:    when to take this  reasons to take this   famotidine 40 MG tablet Commonly known as:  PEPCID Take 1 tablet (40 mg total) by mouth at bedtime.   fluticasone 50 MCG/ACT nasal spray Commonly known as:  FLONASE Place 2 sprays into both nostrils daily.   fluticasone furoate-vilanterol 100-25 MCG/INH Aepb Commonly known as:  BREO ELLIPTA Inhale 1 puff into the lungs daily.   ibuprofen 800 MG tablet Commonly known as:  ADVIL,MOTRIN Take 1 tablet (800 mg total) by mouth every 8 (eight) hours as needed.   levocetirizine 5 MG tablet Commonly known as:  XYZAL Take 1 tablet (5 mg total) by mouth daily as needed for allergies. What changed:  when to take this   levofloxacin 500 MG tablet Commonly known as:  LEVAQUIN Take 1 tablet (500 mg  total) by mouth daily.   montelukast 10 MG tablet Commonly known as:  SINGULAIR Take 1 tablet (10 mg total) by mouth at bedtime. What changed:  when to take this   norethindrone 5 MG tablet Commonly known as:  AYGESTIN Take 1 tablet (5 mg total) by mouth daily.   NUVARING 0.12-0.015 MG/24HR vaginal ring Generic drug:  etonogestrel-ethinyl estradiol Place 1 each vaginally every 28 (twenty-eight) days. Use in continuous fashion for endometriosis   omeprazole 20 MG capsule Commonly known as:  PRILOSEC Take 1 capsule (20 mg total) by mouth daily.    predniSONE 10 MG (21) Tbpk tablet Commonly known as:  STERAPRED UNI-PAK 21 TAB 6 tabs day 1 and taper 10 mg a day - 6 days Replaces:  predniSONE 20 MG tablet   triamcinolone cream 0.1 % Commonly known as:  KENALOG Apply to itchy bump bid as needed     ASK your doctor about these medications   fluconazole 200 MG tablet Commonly known as:  DIFLUCAN Take 1 tablet (200 mg total) by mouth daily for 1 day. Ask about: Should I take this medication?       Today   VITAL SIGNS:  Blood pressure 121/78, pulse 85, temperature 98 F (36.7 C), temperature source Oral, resp. rate 18, height 5\' 3"  (1.6 m), weight 68.5 kg, SpO2 98 %.  I/O:  No intake or output data in the 24 hours ending 08/24/18 1640  PHYSICAL EXAMINATION:  Physical Exam  GENERAL:  48 y.o.-year-old patient lying in the bed with no acute distress.  LUNGS: Normal breath sounds bilaterally, no wheezing, rales,rhonchi or crepitation. No use of accessory muscles of respiration.  CARDIOVASCULAR: S1, S2 normal. No murmurs, rubs, or gallops.  ABDOMEN: Soft, non-tender, non-distended. Bowel sounds present. No organomegaly or mass.  NEUROLOGIC: Moves all 4 extremities. PSYCHIATRIC: The patient is alert and oriented x 3.  SKIN: No obvious rash, lesion, or ulcer.   DATA REVIEW:   CBC No results for input(s): WBC, HGB, HCT, PLT in the last 168 hours.  Chemistries  No results for input(s): NA, K, CL, CO2, GLUCOSE, BUN, CREATININE, CALCIUM, MG, AST, ALT, ALKPHOS, BILITOT in the last 168 hours.  Invalid input(s): GFRCGP  Cardiac Enzymes No results for input(s): TROPONINI in the last 168 hours.  Microbiology Results  Results for orders placed or performed during the hospital encounter of 08/16/18  Respiratory Panel by PCR     Status: None   Collection Time: 08/17/18  2:50 AM  Result Value Ref Range Status   Adenovirus NOT DETECTED NOT DETECTED Final   Coronavirus 229E NOT DETECTED NOT DETECTED Final   Coronavirus HKU1 NOT  DETECTED NOT DETECTED Final   Coronavirus NL63 NOT DETECTED NOT DETECTED Final   Coronavirus OC43 NOT DETECTED NOT DETECTED Final   Metapneumovirus NOT DETECTED NOT DETECTED Final   Rhinovirus / Enterovirus NOT DETECTED NOT DETECTED Final   Influenza A NOT DETECTED NOT DETECTED Final   Influenza B NOT DETECTED NOT DETECTED Final   Parainfluenza Virus 1 NOT DETECTED NOT DETECTED Final   Parainfluenza Virus 2 NOT DETECTED NOT DETECTED Final   Parainfluenza Virus 3 NOT DETECTED NOT DETECTED Final   Parainfluenza Virus 4 NOT DETECTED NOT DETECTED Final   Respiratory Syncytial Virus NOT DETECTED NOT DETECTED Final   Bordetella pertussis NOT DETECTED NOT DETECTED Final   Chlamydophila pneumoniae NOT DETECTED NOT DETECTED Final   Mycoplasma pneumoniae NOT DETECTED NOT DETECTED Final    Comment: Performed at [facility],  [address], [place], [place] [address]    RADIOLOGY:  No results found.  Follow up with PCP in 1 week.  Management plans discussed with the patient, family and they are in agreement.  CODE STATUS:  Code Status History    Date Active Date Inactive Code Status Order ID Comments User Context   08/17/2018 0321 08/18/2018 2205 Full Code [id number]  Barbaraann Rondo, MD ED      TOTAL TIME TAKING CARE OF THIS PATIENT ON DAY OF DISCHARGE: more than 30 minutes.   Molinda Bailiff Tannar Broker M.D on 08/24/2018 at 4:40 PM  Between 7am to 6pm - Pager - [phone]  After 6pm go to [email or web address] - password [email or web address] [facility]  [facility]  Office  [phone]  CC: Primary care physician; Alba Cory, MD  Note: This dictation was prepared with Dragon dictation along with smaller phrase technology. Any transcriptional errors that result from this process are unintentional.

## 2018-09-02 ENCOUNTER — Ambulatory Visit: Payer: Managed Care, Other (non HMO) | Admitting: Pulmonary Disease

## 2018-09-02 ENCOUNTER — Encounter: Payer: Self-pay | Admitting: Pulmonary Disease

## 2018-09-02 VITALS — BP 120/80 | HR 75 | Ht 63.0 in | Wt 148.0 lb

## 2018-09-02 DIAGNOSIS — R05 Cough: Secondary | ICD-10-CM

## 2018-09-02 DIAGNOSIS — J4520 Mild intermittent asthma, uncomplicated: Secondary | ICD-10-CM | POA: Diagnosis not present

## 2018-09-02 DIAGNOSIS — K219 Gastro-esophageal reflux disease without esophagitis: Secondary | ICD-10-CM

## 2018-09-02 DIAGNOSIS — J0100 Acute maxillary sinusitis, unspecified: Secondary | ICD-10-CM | POA: Diagnosis not present

## 2018-09-02 DIAGNOSIS — R059 Cough, unspecified: Secondary | ICD-10-CM

## 2018-09-02 MED ORDER — PANTOPRAZOLE SODIUM 40 MG PO TBEC: 40.0000 mg | DELAYED_RELEASE_TABLET | Freq: Every day | ORAL | 2 refills | Status: DC

## 2018-09-02 MED ORDER — AZITHROMYCIN 250 MG PO TABS: ORAL_TABLET | ORAL | 0 refills | Status: AC

## 2018-09-02 NOTE — Patient Instructions (Signed)
1.  You may discontinue Pepcid.  Discontinue omeprazole.  2.  We will start you on pantoprazole (Protonix) 40 mg daily for your reflux.  3.  A Z-Pak has been sent to your pharmacy for your sinusitis issues.  3.  You will be scheduled for breathing test.  4.  We will see you in follow-up in 2 to 3 weeks time.  Call us sooner should any new difficulties with regards to your breathing arise.

## 2018-09-02 NOTE — Progress Notes (Signed)
Subjective:    Patient ID: [name] [name], female    DOB: 1971/04/04, 48 y.o.   MRN: [id number]  HPI Patient is a 48 year old lifelong never smoker with a history of asthma who presents after recent hospitalization at [facility] for pneumonia.  She is kindly referred by Alba Cory, MD.  The patient had started in December have issues with worsening shortness of breath that she attributed to asthma exacerbation.  Her inhalers however were not working at that time she was mainly using albuterol and at that time Brio Ellipta was also added.  Neither of these inhalers worked well as noted.  She also noticed increasing issues with headaches and sinus drainage.  He subsequently came to the emergency room on 6 January because of a headache and chest pain.  Her blood pressure was noted to be elevated according to the patient.  The patient had a chest CT at the time of admission at [facility] was noted to have bilateral patchy infiltrates consistent with either viral inflammatory pneumonitis.  She was treated for an asthma exacerbation associated with acute pneumonitis.  She was subsequently discharged on 8 January.  She was given a steroid taper and antibiotics.  She actually felt better but now has started to have more issues with her sinuses draining.  She states that her symptoms are improved by not doing anything but as soon as she moves she develops issues with cough and shortness of breath.  Her cough has been mostly nonproductive.  She has also has some hoarseness.  He has not had any orthopnea, paroxysmal nocturnal dyspnea or lower extremity edema.  She has not had any chest pain since the episode on 6 January.  This appears to be mostly pleuritic.  She has had no hemoptysis.  Past medical history, surgical history and family history have been reviewed.  The patient has not resited outside of [place].  She has no pets in the home.  No unusual hobbies.  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  She has not had any recent travel outside of the country.  She has been tested for tuberculosis previously has been negative.  No exposures that she knows of.   Review of Systems  Constitutional: Positive for fatigue.  HENT: Positive for congestion, ear pain, postnasal drip, sinus pressure and voice change.   Eyes: Negative.   Respiratory: Positive for cough, chest tightness and shortness of breath.   Cardiovascular: Positive for palpitations.  Gastrointestinal: Negative.        She describes heartburn and reflux symptoms.  Endocrine: Negative.   Genitourinary: Negative.   Skin: Negative.   Allergic/Immunologic: Negative.   Neurological: Negative.   Hematological: Negative.   Psychiatric/Behavioral: Negative.   All other systems reviewed and are negative.      Objective:   Physical Exam Vitals signs reviewed.  Constitutional:      General: She is not in acute distress.    Appearance: She is well-developed. She is not toxic-appearing.  HENT:     Head: Normocephalic and atraumatic.     Right Ear: A middle ear effusion (Serous) is present.     Left Ear: A middle ear effusion (Serous) is present.     Nose: Congestion present.     Right Turbinates: Swollen.     Left Turbinates: Swollen.     Right Sinus: Maxillary sinus tenderness present.     Left Sinus: Maxillary sinus tenderness present.  Mouth/Throat:     Mouth: Mucous membranes are moist.     Comments: Clear postnasal drip noted Eyes:     Extraocular Movements: Extraocular movements intact.     Pupils: Pupils are equal, round, and reactive to light.  Neck:     Musculoskeletal: Normal range of motion and neck supple.     Thyroid: No thyromegaly.     Vascular: No JVD.     Trachea: No tracheal deviation.  Cardiovascular:     Rate and Rhythm: Normal rate and regular rhythm.     Pulses: Normal pulses.     Heart sounds: Normal heart sounds.  Pulmonary:      Effort: Pulmonary effort is normal. No tachypnea.     Breath sounds: Normal breath sounds. No wheezing or rhonchi.  Abdominal:     General: Abdomen is flat. There is no distension.  Musculoskeletal: Normal range of motion.     Right lower leg: No edema.     Left lower leg: No edema.  Lymphadenopathy:     Cervical: No cervical adenopathy.  Neurological:     General: No focal deficit present.     Mental Status: She is alert and oriented to person, place, and time.  Psychiatric:        Mood and Affect: Mood normal.        Behavior: Behavior normal.    Laboratory data: I have independently reviewed that CT scan performed on 16 August 2018 the findings are consistent with inflammatory/possible viral pneumonitis.  Findings were very mild.  A few areas of scattered groundglass opacities.  She also was noted to have a hiatal hernia as well.      Assessment & Plan:    1.  Persistent cough: This may be due to persistent inflammatory changes other possibilities include gastroesophageal reflux with laryngopharyngeal component.  Patient had previously been on a PPI but however has discontinued taking this.  Will address these issues as below.  2.  Acute maxillary sinusitis likely a recurrent issue: We will treat with a azithromycin this will also help as an anti-inflammatory.  3.  Gastroesophageal reflux disease with LPR component likely: We will start Protonix 40 mg daily for reflux.  The patient was also taught proper antireflux measures.  4.  Mild intermittent asthma without complication: This may be aggravating her cough issues.  We will obtain pulmonary function testing.  For now I recommend that she continue Brio Ellipta and as needed albuterol.  We will see the patient back in 2 to 3 weeks time.  She is to contact us prior to that time should any new difficulties arise.  Thank you for allowing us to participate in this patient's care.

## 2018-09-07 ENCOUNTER — Encounter: Payer: Self-pay | Admitting: Pulmonary Disease

## 2018-09-09 ENCOUNTER — Ambulatory Visit: Payer: Managed Care, Other (non HMO) | Attending: Pulmonary Disease

## 2018-09-09 DIAGNOSIS — K219 Gastro-esophageal reflux disease without esophagitis: Secondary | ICD-10-CM

## 2018-09-09 MED ORDER — ALBUTEROL SULFATE (2.5 MG/3ML) 0.083% IN NEBU
2.5000 mg | INHALATION_SOLUTION | Freq: Once | RESPIRATORY_TRACT | Status: AC
  Administered 2018-09-09: 2.5 mg via RESPIRATORY_TRACT
  Filled 2018-09-09: qty 3

## 2018-09-15 ENCOUNTER — Ambulatory Visit: Payer: Self-pay | Admitting: Pulmonary Disease

## 2018-09-16 ENCOUNTER — Encounter: Payer: Self-pay | Admitting: Pulmonary Disease

## 2018-09-20 ENCOUNTER — Other Ambulatory Visit: Payer: Self-pay

## 2018-09-20 ENCOUNTER — Ambulatory Visit
Admission: RE | Admit: 2018-09-20 | Discharge: 2018-09-20 | Disposition: A | Discharge status: Home / Self Care | Payer: Managed Care, Other (non HMO) | Source: Ambulatory Visit | Attending: Adult Health | Admitting: Adult Health

## 2018-09-20 ENCOUNTER — Ambulatory Visit: Payer: Self-pay | Admitting: Adult Health

## 2018-09-20 ENCOUNTER — Ambulatory Visit
Admission: RE | Admit: 2018-09-20 | Discharge: 2018-09-20 | Disposition: A | Discharge status: Home / Self Care | Payer: Managed Care, Other (non HMO) | Attending: Adult Health | Admitting: Adult Health

## 2018-09-20 VITALS — BP 130/70 | HR 79 | Temp 98.4°F | Resp 14

## 2018-09-20 DIAGNOSIS — R05 Cough: Secondary | ICD-10-CM | POA: Insufficient documentation

## 2018-09-20 DIAGNOSIS — R059 Cough, unspecified: Secondary | ICD-10-CM

## 2018-09-20 DIAGNOSIS — R5383 Other fatigue: Secondary | ICD-10-CM

## 2018-09-20 DIAGNOSIS — J0101 Acute recurrent maxillary sinusitis: Secondary | ICD-10-CM

## 2018-09-20 MED ORDER — BENZONATATE 100 MG PO CAPS: 100.0000 mg | ORAL_CAPSULE | Freq: Two times a day (BID) | ORAL | 0 refills | Status: DC | PRN

## 2018-09-20 MED ORDER — PREDNISONE 10 MG (21) PO TBPK: ORAL_TABLET | ORAL | 0 refills | Status: DC

## 2018-09-20 MED ORDER — AMOXICILLIN-POT CLAVULANATE 875-125 MG PO TABS: 1.0000 | ORAL_TABLET | Freq: Two times a day (BID) | ORAL | 0 refills | Status: DC

## 2018-09-20 NOTE — Progress Notes (Signed)
Bretlyn Please let patient know that her chest x ray is normal and keep current plan and follow up with her primary care and pulmonary doctor within 5 days

## 2018-09-20 NOTE — Patient Instructions (Signed)
Cough, Adult  A cough helps to clear your throat and lungs. A cough may last only 2-3 weeks (acute), or it may last longer than 8 weeks (chronic). Many different things can cause a cough. A cough may be a sign of an illness or another medical condition. Follow these instructions at home:  Pay attention to any changes in your cough.  Take medicines only as told by your doctor. ? If you were prescribed an antibiotic medicine, take it as told by your doctor. Do not stop taking it even if you start to feel better. ? Talk with your doctor before you try using a cough medicine.  Drink enough fluid to keep your pee (urine) clear or pale yellow.  If the air is dry, use a cold steam vaporizer or humidifier in your home.  Stay away from things that make you cough at work or at home.  If your cough is worse at night, try using extra pillows to raise your head up higher while you sleep.  Do not smoke, and try not to be around smoke. If you need help quitting, ask your doctor.  Do not have caffeine.  Do not drink alcohol.  Rest as needed. Contact a doctor if:  You have new problems (symptoms).  You cough up yellow fluid (pus).  Your cough does not get better after 2-3 weeks, or your cough gets worse.  Medicine does not help your cough and you are not sleeping well.  You have pain that gets worse or pain that is not helped with medicine.  You have a fever.  You are losing weight and you do not know why.  You have night sweats. Get help right away if:  You cough up blood.  You have trouble breathing.  Your heartbeat is very fast. This information is not intended to replace advice given to you by your health care provider. Make sure you discuss any questions you have with your health care provider. Document Released: 04/10/2011 Document Revised: 01/03/2016 Document Reviewed: 10/04/2014 Elsevier Interactive Patient Education  2019 Else[employer] Inc. Sinusitis, Adult Sinusitis is  soreness and swelling (inflammation) of your sinuses. Sinuses are hollow spaces in the bones around your face. They are located:  Around your eyes.  In the middle of your forehead.  Behind your nose.  In your cheekbones. Your sinuses and nasal passages are lined with a fluid called mucus. Mucus drains out of your sinuses. Swelling can trap mucus in your sinuses. This lets germs (bacteria, virus, or fungus) grow, which leads to infection. Most of the time, this condition is caused by a virus. What are the causes? This condition is caused by:  Allergies.  Asthma.  Germs.  Things that block your nose or sinuses.  Growths in the nose (nasal polyps).  Chemicals or irritants in the air.  Fungus (rare). What increases the risk? You are more likely to develop this condition if:  You have a weak body defense system (immune system).  You do a lot of swimming or diving.  You use nasal sprays too much.  You smoke. What are the signs or symptoms? The main symptoms of this condition are pain and a feeling of pressure around the sinuses. Other symptoms include:  Stuffy nose (congestion).  Runny nose (drainage).  Swelling and warmth in the sinuses.  Headache.  Toothache.  A cough that may get worse at night.  Mucus that collects in the throat or the back of the nose (postnasal drip).  Being  unable to smell and taste.  Being very tired (fatigue).  A fever.  Sore throat.  Bad breath. How is this diagnosed? This condition is diagnosed based on:  Your symptoms.  Your medical history.  A physical exam.  Tests to find out if your condition is short-term (acute) or long-term (chronic). Your doctor may: ? Check your nose for growths (polyps). ? Check your sinuses using a tool that has a light (endoscope). ? Check for allergies or germs. ? Do imaging tests, such as an MRI or CT scan. How is this treated? Treatment for this condition depends on the cause and whether  it is short-term or long-term.  If caused by a virus, your symptoms should go away on their own within 10 days. You may be given medicines to relieve symptoms. They include: ? Medicines that shrink swollen tissue in the nose. ? Medicines that treat allergies (antihistamines). ? A spray that treats swelling of the nostrils. ? Rinses that help get rid of thick mucus in your nose (nasal saline washes).  If caused by bacteria, your doctor may wait to see if you will get better without treatment. You may be given antibiotic medicine if you have: ? A very bad infection. ? A weak body defense system.  If caused by growths in the nose, you may need to have surgery. Follow these instructions at home: Medicines  Take, use, or apply over-the-counter and prescription medicines only as told by your doctor. These may include nasal sprays.  If you were prescribed an antibiotic medicine, take it as told by your doctor. Do not stop taking the antibiotic even if you start to feel better. Hydrate and humidify   Drink enough water to keep your pee (urine) pale yellow.  Use a cool mist humidifier to keep the humidity level in your home above 50%.  Breathe in steam for 10-15 minutes, 3-4 times a day, or as told by your doctor. You can do this in the bathroom while a hot shower is running.  Try not to spend time in cool or dry air. Rest  Rest as much as you can.  Sleep with your head raised (elevated).  Make sure you get enough sleep each night. General instructions   Put a warm, moist washcloth on your face 3-4 times a day, or as often as told by your doctor. This will help with discomfort.  Wash your hands often with soap and water. If there is no soap and water, use hand sanitizer.  Do not smoke. Avoid being around people who are smoking (secondhand smoke).  Keep all follow-up visits as told by your doctor. This is important. Contact a doctor if:  You have a fever.  Your symptoms get  worse.  Your symptoms do not get better within 10 days. Get help right away if:  You have a very bad headache.  You cannot stop throwing up (vomiting).  You have very bad pain or swelling around your face or eyes.  You have trouble seeing.  You feel confused.  Your neck is stiff.  You have trouble breathing. Summary  Sinusitis is swelling of your sinuses. Sinuses are hollow spaces in the bones around your face.  This condition is caused by tissues in your nose that become inflamed or swollen. This traps germs. These can lead to infection.  If you were prescribed an antibiotic medicine, take it as told by your doctor. Do not stop taking it even if you start to feel better.  Keep all follow-up visits as told by your doctor. This is important. This information is not intended to replace advice given to you by your health care provider. Make sure you discuss any questions you have with your health care provider. Document Released: 01/14/2008 Document Revised: 12/28/2017 Document Reviewed: 12/28/2017 Elsevier Interactive Patient Education  2019 [employer].

## 2018-09-20 NOTE — Progress Notes (Signed)
Subjective:     Patient ID: [name] [name], female   DOB: September 10, 1970, 48 y.o.   MRN: [id number]  Blood pressure 130/70, pulse 79, temperature 98.4 F (36.9 C), temperature source Oral, resp. rate 14, SpO2 99 %.  Patient is a 48 year old female in no acute distress who complains of cough, congestion, sinus pressure since 09/02/18.   Denies any recent travel, leg pan.  Denies any chance of pregnancy.   Patient  denies any fever, body aches,chills, rash, chest pain, shortness of breath, nausea, vomiting, or diarrhea.   History of pneumonia 08/2018   Z pack awas given by pulmonary on 09/02/18  10/01/18  she sees pulmonary again. She reports her symptoms did not resolve  with azithromycin - she had mild improvement.    Cough  This is a recurrent (started around 09/02/18 - she reports she was over pneumonia after being discharged from hospital January7th) problem. The current episode started in the past 7 days (she was in the hospital for pneumonia january 6th 7th and 8th ). The problem has been gradually worsening. The problem occurs hourly. The cough is non-productive (post nasal drainage/ yellow dark discharge from nose ). Associated symptoms include ear congestion, headaches (mild frontal ), nasal congestion, postnasal drip, rhinorrhea, a sore throat and wheezing (intermittent.). Pertinent negatives include no chest pain, chills, ear pain, fever, heartburn, hemoptysis, myalgias, rash, shortness of breath, sweats or weight loss. Nothing aggravates the symptoms. Risk factors: denies ever smoking. She has tried prescription cough suppressant and OTC inhaler (sudafed / she has Tussionex using from when in hospital - took last dose she had last night ) for the symptoms. The treatment provided mild relief. Her past medical history is significant for asthma, environmental allergies and pneumonia. There is no history of bronchiectasis, bronchitis, COPD or emphysema.     Patient Active Problem List   Diagnosis Date Noted  . Chest pain with low risk for cardiac etiology 08/17/2018  . Asthma, intermittent with acute exacerbation 07/19/2018  . 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 Post-traumatic osteoarthritis of right foot 09/08/2016  . Endometriosis of pelvis 01/05/2013    Current Outpatient Medications:  .  albuterol (PROVENTIL HFA;VENTOLIN HFA) 108 (90 Base) MCG/ACT inhaler, Inhale 2 puffs into the lungs every 6 (six) hours as needed for wheezing or shortness of breath., Disp: 1 Inhaler, Rfl: 0 .  aspirin EC 325 MG EC tablet, Take 1 tablet (325 mg total) by mouth 2 (two) times daily., Disp: 60 tablet, Rfl: 0 .  chlorpheniramine-HYDROcodone (TUSSIONEX PENNKINETIC ER) 10-8 MG/5ML SUER, Take 5 mLs by mouth every 12 (twelve) hours as needed for cough., Disp: 140 mL, Rfl: 0 .  cyclobenzaprine (FLEXERIL) 5 MG tablet, Take 1 tablet (5 mg total) by mouth at bedtime. (Patient taking differently: Take 5 mg by mouth at bedtime as needed. ), Disp: 30 tablet, Rfl: 0 .  famotidine (PEPCID) 40 MG tablet, Take 1 tablet (40 mg total) by mouth at bedtime., Disp: 90 tablet, Rfl: 1 .  fluticasone (FLONASE) 50 MCG/ACT nasal spray, Place 2 sprays into both nostrils daily., Disp: 16 g, Rfl: 6 .  fluticasone furoate-vilanterol (BREO ELLIPTA) 100-25 MCG/INH AEPB, Inhale 1 puff into the lungs daily., Disp: 60 each, Rfl: 0 .  ibuprofen (ADVIL,MOTRIN) 800 MG tablet, Take 1 tablet (800 mg total) by mouth every 8 (eight) hours as needed., Disp: 60 tablet, Rfl: 1 .  levocetirizine (XYZAL) 5 MG tablet, Take 1 tablet (5 mg total) by  mouth daily as needed for allergies. (Patient taking differently: Take 5 mg by mouth at bedtime. ), Disp: 30 tablet, Rfl: 11 .  montelukast (SINGULAIR) 10 MG tablet, Take 1 tablet (10 mg total) by mouth at bedtime. (Patient taking differently: Take 10 mg by mouth daily. ), Disp: 90 tablet, Rfl: 1 .  NUVARING 0.12-0.015 MG/24HR vaginal ring, Place 1 each  vaginally every 28 (twenty-eight) days. Use in continuous fashion for endometriosis, Disp: 3 each, Rfl: 6 .  omeprazole (PRILOSEC) 20 MG capsule, Take 1 capsule (20 mg total) by mouth daily., Disp: 90 capsule, Rfl: 1 .  pantoprazole (PROTONIX) 40 MG tablet, Take 1 tablet (40 mg total) by mouth daily., Disp: 30 tablet, Rfl: 2 .  triamcinolone cream (KENALOG) 0.1 %, Apply to itchy bump bid as needed, Disp: , Rfl:   Review of Systems  Constitutional: Positive for fatigue. Negative for activity change, appetite change, chills, diaphoresis, fever, unexpected weight change and weight loss.  HENT: Positive for congestion, postnasal drip, rhinorrhea, sinus pressure, sinus pain, sneezing and sore throat. Negative for dental problem, drooling, ear discharge, ear pain, facial swelling, hearing loss, mouth sores, nosebleeds, tinnitus, trouble swallowing and voice change.   Eyes: Negative.   Respiratory: Positive for cough and wheezing (intermittent.). Negative for apnea, hemoptysis, choking, chest tightness, shortness of breath and stridor.   Cardiovascular: Negative for chest pain, palpitations and leg swelling.  Gastrointestinal: Negative.  Negative for heartburn.  Endocrine: Negative.   Genitourinary: Negative.   Musculoskeletal: Negative for arthralgias, back pain, gait problem, joint swelling, myalgias, neck pain and neck stiffness.  Skin: Negative.  Negative for color change, pallor, rash and wound.  Allergic/Immunologic: Positive for environmental allergies. Negative for food allergies and immunocompromised state.  Neurological: Positive for headaches (mild frontal ). Negative for dizziness, tremors, seizures, syncope, facial asymmetry, speech difficulty, weakness, light-headedness and numbness.  Hematological: Negative.   Psychiatric/Behavioral: Negative.        Objective:   Physical Exam Vitals signs reviewed.  Constitutional:      General: She is not in acute distress.    Appearance: Normal  appearance. She is well-developed and well-groomed. She is not ill-appearing, toxic-appearing or diaphoretic.     Comments: Patient is alert and oriented and responsive to questions Engages in eye contact with provider. Speaks in full sentences without any pauses without any shortness of breath or distress.    HENT:     Head: Normocephalic and atraumatic.     Salivary Glands: Right salivary gland is not diffusely enlarged or tender. Left salivary gland is not diffusely enlarged or tender.     Right Ear: Hearing, ear canal and external ear normal. No tenderness. A middle ear effusion (dark brown/yellow fluid bilaterally ) is present. There is no impacted cerumen. Tympanic membrane is erythematous. Tympanic membrane is not retracted or bulging.     Left Ear: Hearing, ear canal and external ear normal. No tenderness. A middle ear effusion is present. There is no impacted cerumen. Tympanic membrane is erythematous. Tympanic membrane is not retracted or bulging.     Nose: Mucosal edema, congestion and rhinorrhea present.     Right Sinus: Maxillary sinus tenderness and frontal sinus tenderness present.     Left Sinus: Maxillary sinus tenderness and frontal sinus tenderness present.     Mouth/Throat:     Lips: Pink.     Mouth: Mucous membranes are moist.     Pharynx: Uvula midline. Oropharyngeal exudate and posterior oropharyngeal erythema present. No pharyngeal swelling or uvula  swelling.     Tonsils: No tonsillar exudate or tonsillar abscesses.  Eyes:     General: No scleral icterus.       Right eye: No discharge.        Left eye: No discharge.     Extraocular Movements: Extraocular movements intact.     Conjunctiva/sclera: Conjunctivae normal.     Pupils: Pupils are equal, round, and reactive to light.  Neck:     Musculoskeletal: Full passive range of motion without pain, normal range of motion and neck supple. No neck rigidity or muscular tenderness.     Vascular: No carotid bruit.      Trachea: Trachea and phonation normal.     Meningeal: Brudzinski's sign absent.  Cardiovascular:     Rate and Rhythm: Normal rate and regular rhythm.     Pulses:          Radial pulses are 2+ on the right side and 2+ on the left side.       Dorsalis pedis pulses are 2+ on the right side and 2+ on the left side.     Heart sounds: Normal heart sounds. No murmur. No friction rub. No gallop.   Pulmonary:     Effort: Pulmonary effort is normal. No tachypnea, bradypnea, accessory muscle usage, prolonged expiration or respiratory distress.     Breath sounds: No stridor. Examination of the right-upper field reveals wheezing. Examination of the left-upper field reveals wheezing. Wheezing present. No decreased breath sounds, rhonchi or rales.     Comments: Mild expiratory wheeze.  No other adventitious sounds auscultated Chest:     Chest wall: No tenderness.  Abdominal:     Palpations: Abdomen is soft.  Musculoskeletal: Normal range of motion.  Lymphadenopathy:     Cervical: Cervical adenopathy present.     Right cervical: Superficial cervical adenopathy (mild mobile, mildly tender soft bilateral lymph node swelling less than 1cm ) present. No deep or posterior cervical adenopathy.    Left cervical: Superficial cervical adenopathy present. No deep or posterior cervical adenopathy.  Skin:    General: Skin is warm and dry.     Capillary Refill: Capillary refill takes less than 2 seconds.  Neurological:     General: No focal deficit present.     Mental Status: She is alert and oriented to person, place, and time.     Gait: Gait normal.     Comments: Patient moves on and off of exam table and in room without difficulty. Gait is normal in hall and in room. Patient is oriented to person place time and situation. Patient answers questions appropriately and engages in conversation.   Psychiatric:        Attention and Perception: Attention and perception normal.        Mood and Affect: Mood and affect  normal.        Speech: Speech normal.        Behavior: Behavior normal. Behavior is cooperative.        Thought Content: Thought content normal.        Cognition and Memory: Cognition and memory normal.        Judgment: Judgment normal.        Assessment:     Acute recurrent maxillary sinusitis  Fatigue, unspecified type - Plan: CBC with Diff  Cough - Plan: DG Chest 2 View, CBC with Diff      Plan:     Meds ordered this encounter  Medications  . amoxicillin-clavulanate (AUGMENTIN) 875-125  MG tablet    Sig: Take 1 tablet by mouth 2 (two) times daily.    Dispense:  20 tablet    Refill:  0  . predniSONE (STERAPRED UNI-PAK 21 TAB) 10 MG (21) TBPK tablet    Sig: PO: Take 6 tablets on day 1:Take 5 tablets day 2:Take 4 tablets day 3: Take 3 tablets day 4:Take 2 tablets day five: 5 Take 1 tablet day 6    Dispense:  21 tablet    Refill:  0  . benzonatate (TESSALON) 100 MG capsule    Sig: Take 1 capsule (100 mg total) by mouth 2 (two) times daily as needed for cough.    Dispense:  20 capsule    Refill:  0    Exam consistent with recurrent Acute Maxillary sinusitis- failed treatment z-pack on 09/02/17.   Return in about 5 days (around 09/25/2018),  with primary care and pulmonary , for Call 911 for emergencies, Go to Emergency room/ urgent care if worse.She is in agreement with following up with her primary care and pulmonary within 5 days given her chronic health issues. She is aware should any symptoms worsen or change she will be seen immediately in emergency room and CALL 911 if emergent RED FLAG symptoms as discussed.   Advised patient call the office or your primary care doctor for an appointment if no improvement within 72 hours or if any symptoms change or worsen at any time  Advised ER or urgent Care if after hours or on weekend. Call 911 for emergency symptoms at any time.Patinet verbalized understanding of all instructions given/reviewed and treatment plan and has no further  questions or concerns at this time.

## 2018-09-21 ENCOUNTER — Telehealth: Payer: Self-pay

## 2018-09-21 ENCOUNTER — Telehealth: Payer: Self-pay | Admitting: Adult Health

## 2018-09-21 LAB — CBC WITH DIFFERENTIAL/PLATELET
BASOS: 0 %
Basophils Absolute: 0 10*3/uL (ref 0.0–0.2)
EOS (ABSOLUTE): 0.1 10*3/uL (ref 0.0–0.4)
Eos: 1 %
Hematocrit: 40 % (ref 34.0–46.6)
Hemoglobin: 13.5 g/dL (ref 11.1–15.9)
Immature Grans (Abs): 0 10*3/uL (ref 0.0–0.1)
Immature Granulocytes: 1 %
LYMPHS ABS: 0.9 10*3/uL (ref 0.7–3.1)
Lymphs: 13 %
MCH: 28.1 pg (ref 26.6–33.0)
MCHC: 33.8 g/dL (ref 31.5–35.7)
MCV: 83 fL (ref 79–97)
Monocytes Absolute: 0.8 10*3/uL (ref 0.1–0.9)
Monocytes: 11 %
NEUTROS ABS: 5.2 10*3/uL (ref 1.4–7.0)
Neutrophils: 74 %
Platelets: 316 10*3/uL (ref 150–450)
RBC: 4.8 x10E6/uL (ref 3.77–5.28)
RDW: 13.6 % (ref 11.7–15.4)
WBC: 7 10*3/uL (ref 3.4–10.8)

## 2018-09-21 NOTE — Telephone Encounter (Signed)
Patient was contacted with her X ray results and her lab results from her visit and informed to F/U with her PCP and her pulmonologist. The patient gave verbal understanding

## 2018-09-21 NOTE — Telephone Encounter (Signed)
Patient called with the below results of normal chest x ray and complete blood count lab work.  She is to follow up with her PCP and pulmonary within 5 days.  She states " I am feeling better than when I saw you in the office " Advised patient call the office or your primary care doctor for an appointment if no improvement within 72 hours or if any symptoms change or worsen at any time  Advised ER or urgent Care if after hours or on weekend. Call 911 for emergency symptoms at any time.Patinet verbalized understanding of all instructions given/reviewed and treatment plan and has no further questions or concerns at this time.    Patient verbalized understanding of all instructions given and denies any further questions at this time.    CLINICAL DATA:  Cough and congestion with shortness of breath  EXAM: CHEST - 2 VIEW  COMPARISON:  Chest radiograph August 16, 2018 and chest CT August 16, 2018  FINDINGS: No edema or consolidation. The heart size and pulmonary vascularity are normal. No adenopathy. No bone lesions.  IMPRESSION: No edema or consolidation.   Electronically Signed   By: Bretta Bang III M.D.   On: 09/20/2018 12:36  Results for orders placed or performed in visit on 09/20/18 (from the past 24 hour(s))  CBC with Diff     Status: None   Collection Time: 09/20/18 12:00 PM  Result Value Ref Range   WBC 7.0 3.4 - 10.8 x10E3/uL   RBC 4.80 3.77 - 5.28 x10E6/uL   Hemoglobin 13.5 11.1 - 15.9 g/dL   Hematocrit 29.5 28.4 - 46.6 %   MCV 83 79 - 97 fL   MCH 28.1 26.6 - 33.0 pg   MCHC 33.8 31.5 - 35.7 g/dL   RDW 13.2 44.0 - 10.2 %   Platelets 316 150 - 450 x10E3/uL   Neutrophils 74 Not Estab. %   Lymphs 13 Not Estab. %   Monocytes 11 Not Estab. %   Eos 1 Not Estab. %   Basos 0 Not Estab. %   Neutrophils Absolute 5.2 1.4 - 7.0 x10E3/uL   Lymphocytes Absolute 0.9 0.7 - 3.1 x10E3/uL   Monocytes Absolute 0.8 0.1 - 0.9 x10E3/uL   EOS (ABSOLUTE) 0.1 0.0 - 0.4  x10E3/uL   Basophils Absolute 0.0 0.0 - 0.2 x10E3/uL   Immature Granulocytes 1 Not Estab. %   Immature Grans (Abs) 0.0 0.0 - 0.1 x10E3/uL   Narrative   Performed at:  [address] [place], [place]  [id number] Lab Director: Jolene Schimke MD, Phone:  [phone]

## 2018-09-21 NOTE — Progress Notes (Signed)
Please let her know her chest x ray and CBC are within normal limits and she should keep follow up with her pcp and pulmonologist.

## 2018-09-21 NOTE — Progress Notes (Signed)
Called and documented. 

## 2018-09-21 NOTE — Telephone Encounter (Signed)
-----   Message from [facility]retlyn Ward, [place]MA sent at 09/21/2018 11:17 AM EST ----- Regarding: Could you please call for results  ----- Message ----- From: Berniece PapFlinchum, Michelle S, FNP Sent: 09/20/2018   1:37 PM EST To: Erskine SquibbBretlyn Ward, CMA, Alba CoryKrichna Sowles, MD, #  Bretlyn Please let patient know that her chest x ray is normal and keep current plan and follow up with her primary care and pulmonary doctor within 5 days

## 2018-09-22 ENCOUNTER — Ambulatory Visit: Payer: Managed Care, Other (non HMO) | Admitting: Nurse Practitioner

## 2018-09-22 ENCOUNTER — Other Ambulatory Visit: Payer: Self-pay | Admitting: Pulmonary Disease

## 2018-09-22 ENCOUNTER — Encounter: Payer: Self-pay | Admitting: Nurse Practitioner

## 2018-09-22 ENCOUNTER — Telehealth: Payer: Self-pay

## 2018-09-22 ENCOUNTER — Other Ambulatory Visit: Payer: Self-pay | Admitting: Family Medicine

## 2018-09-22 ENCOUNTER — Encounter: Payer: Self-pay | Admitting: Family Medicine

## 2018-09-22 VITALS — BP 136/84 | HR 98 | Temp 98.1°F | Resp 18 | Ht 63.0 in | Wt 151.3 lb

## 2018-09-22 DIAGNOSIS — R05 Cough: Secondary | ICD-10-CM | POA: Diagnosis not present

## 2018-09-22 DIAGNOSIS — R059 Cough, unspecified: Secondary | ICD-10-CM

## 2018-09-22 DIAGNOSIS — J4521 Mild intermittent asthma with (acute) exacerbation: Secondary | ICD-10-CM

## 2018-09-22 MED ORDER — FLUTICASONE FUROATE-VILANTEROL 100-25 MCG/INH IN AEPB: INHALATION_SPRAY | RESPIRATORY_TRACT | 3 refills | Status: DC

## 2018-09-22 MED ORDER — PROMETHAZINE-DM 6.25-15 MG/5ML PO SYRP: 2.5000 mL | ORAL_SOLUTION | Freq: Every day | ORAL | 0 refills | Status: DC

## 2018-09-22 NOTE — Telephone Encounter (Signed)
Per my chart message refills for [facility] submitted to [facility]. Nothing further needed.

## 2018-09-22 NOTE — Progress Notes (Signed)
Name: [name] [name] MRN: [id number] DOB: November 14, 1970 Date:09/22/2018 Progress Note Subjective Chief Complaint Chief Complaint  Patient presents with  . Cough    patient has a deep dry cough that started on friday. she [facility] and was given amoxicillin, tessalon perls, & prednisone but it has not helped.  . Nasal Congestion  . Fatigue   HPI Patient presents to the clinic with a productive cough, congestion, and rhinorhea was seen for same 2 days ago. Went to see outpatient [facility] on 2/10 and was prescribed Augumentin, prednisone, and Tessalon. Additionally, chest x-ray done and was unremarkable. Per patient, the cough has not improved with the tessalon or muccinex. Takes singular and Flonase daily for asthma. Has been out of her Breo for 2 days Pt denies fever, chills, nausea, vomiting , or abdominal pain. Has an appointment with pulmonology on 10/01/18.    Patient Active Problem List   Diagnosis Date Noted  . Chest pain with low risk for cardiac etiology 08/17/2018  . Asthma, intermittent with acute exacerbation 07/19/2018  . 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 Post-traumatic osteoarthritis of right foot 09/08/2016  . Endometriosis of pelvis 01/05/2013   Past Medical History:  Diagnosis Date  . Allergy   . Arthritis   . Asthma   . Constipation   . Endometriosis    Uses Nuvaring continuously  . Heart murmur   . Ulcer    Past Surgical History:  Procedure Laterality Date  . ANKLE SURGERY    . ANKLE SURGERY Right 06/11/2017  . APPENDECTOMY  2008  . CESAREAN SECTION  1995  . CHOLECYSTECTOMY  2000  . LAPAROSCOPIC ENDOMETRIOSIS FULGURATION    . LAPAROSCOPY     for endometriosis  . WISDOM TOOTH EXTRACTION     x2   Social History   Tobacco Use  . Smoking status: Never Smoker  . Smokeless tobacco: Never Used  Substance Use Topics  . Alcohol use: No    Alcohol/week: 0.0 standard drinks    Current  Outpatient Medications:  .  albuterol (PROVENTIL HFA;VENTOLIN HFA) 108 (90 Base) MCG/ACT inhaler, Inhale 2 puffs into the lungs every 6 (six) hours as needed for wheezing or shortness of breath., Disp: 1 Inhaler, Rfl: 0 .  amoxicillin-clavulanate (AUGMENTIN) 875-125 MG tablet, Take 1 tablet by mouth 2 (two) times daily., Disp: 20 tablet, Rfl: 0 .  aspirin EC 325 MG EC tablet, Take 1 tablet (325 mg total) by mouth 2 (two) times daily., Disp: 60 tablet, Rfl: 0 .  benzonatate (TESSALON) 100 MG capsule, Take 1 capsule (100 mg total) by mouth 2 (two) times daily as needed for cough., Disp: 20 capsule, Rfl: 0 .  cyclobenzaprine (FLEXERIL) 5 MG tablet, Take 1 tablet (5 mg total) by mouth at bedtime. (Patient taking differently: Take 5 mg by mouth at bedtime as needed. ), Disp: 30 tablet, Rfl: 0 .  fluticasone (FLONASE) 50 MCG/ACT nasal spray, Place 2 sprays into both nostrils daily., Disp: 16 g, Rfl: 6 .  fluticasone furoate-vilanterol (BREO ELLIPTA) 100-25 MCG/INH AEPB, INHALE 1 PUFF BY MOUTH ONCE DAILY, Disp: 60 each, Rfl: 3 .  ibuprofen (ADVIL,MOTRIN) 800 MG tablet, Take 1 tablet (800 mg total) by mouth every 8 (eight) hours as needed., Disp: 60 tablet, Rfl: 1 .  levocetirizine (XYZAL) 5 MG tablet, Take 1 tablet (5 mg total) by mouth daily as needed for allergies. (Patient taking differently: Take 5 mg by mouth at bedtime. ), Disp:  30 tablet, Rfl: 11 .  montelukast (SINGULAIR) 10 MG tablet, Take 1 tablet (10 mg total) by mouth at bedtime. (Patient taking differently: Take 10 mg by mouth daily. ), Disp: 90 tablet, Rfl: 1 .  NUVARING 0.12-0.015 MG/24HR vaginal ring, Place 1 each vaginally every 28 (twenty-eight) days. Use in continuous fashion for endometriosis, Disp: 3 each, Rfl: 6 .  omeprazole (PRILOSEC) 20 MG capsule, Take 1 capsule (20 mg total) by mouth daily., Disp: 90 capsule, Rfl: 1 .  pantoprazole (PROTONIX) 40 MG tablet, Take 1 tablet (40 mg total) by mouth daily., Disp: 30 tablet, Rfl: 2 .   predniSONE (STERAPRED UNI-PAK 21 TAB) 10 MG (21) TBPK tablet, PO: Take 6 tablets on day 1:Take 5 tablets day 2:Take 4 tablets day 3: Take 3 tablets day 4:Take 2 tablets day five: 5 Take 1 tablet day 6, Disp: 21 tablet, Rfl: 0 .  promethazine-dextromethorphan (PROMETHAZINE-DM) 6.25-15 MG/5ML syrup, Take 2.5 mLs by mouth at bedtime., Disp: 118 mL, Rfl: 0 .  triamcinolone cream (KENALOG) 0.1 %, Apply to itchy bump bid as needed, Disp: , Rfl:  Allergies  Allergen Reactions  . Mosquito (Diagnostic) Swelling   Review of Systems  Constitutional: Positive for malaise/fatigue. Negative for chills, fever and weight loss.  HENT: Positive for sore throat. Negative for ear discharge, ear pain, hearing loss and tinnitus.   Eyes: Negative for pain, discharge and redness.  Respiratory: Positive for cough and sputum production. Negative for shortness of breath.   Cardiovascular: Negative for chest pain.  Gastrointestinal: Negative for abdominal pain, nausea and vomiting.  Musculoskeletal: Negative for myalgias.  Neurological: Negative for dizziness, tremors and headaches.   No other specific complaints in a complete review of systems (except as listed in HPI above). Objective Vitals:   09/22/18 1024  BP: 136/84  Pulse: 98  Resp: 18  Temp: 98.1 F (36.7 C)  TempSrc: Oral  SpO2: 98%  Weight: 151 lb 4.8 oz (68.6 kg)  Height: 5\' 3"  (1.6 m)    Body mass index is 26.8 kg/m. Nursing Note and Vital Signs reviewed. Physical Exam Constitutional:      Appearance: Normal appearance.  HENT:     Head: Normocephalic and atraumatic.     Right Ear: Hearing normal. Tympanic membrane is retracted.     Left Ear: Hearing normal. Tympanic membrane is retracted.     Nose:     Right Sinus: Maxillary sinus tenderness present. No frontal sinus tenderness.     Left Sinus: Maxillary sinus tenderness present. No frontal sinus tenderness.     Mouth/Throat:     Mouth: Mucous membranes are moist.  Eyes:      Conjunctiva/sclera: Conjunctivae normal.     Pupils: Pupils are equal, round, and reactive to light.  Cardiovascular:     Rate and Rhythm: Normal rate and regular rhythm.     Pulses: Normal pulses.     Heart sounds: Normal heart sounds.  Pulmonary:     Effort: Pulmonary effort is normal.     Breath sounds: Normal breath sounds.  Lymphadenopathy:     Cervical: No cervical adenopathy.  Skin:    General: Skin is warm.     Capillary Refill: Capillary refill takes less than 2 seconds.  Neurological:     General: No focal deficit present.     Mental Status: She is alert and oriented to person, place, and time.  Psychiatric:        Mood and Affect: Mood normal.    ? Results for  orders placed or performed in visit on 09/20/18 (from the past 48 hour(s))  CBC with Diff     Status: None   Collection Time: 09/20/18 12:00 PM  Result Value Ref Range   WBC 7.0 3.4 - 10.8 x10E3/uL   RBC 4.80 3.77 - 5.28 x10E6/uL   Hemoglobin 13.5 11.1 - 15.9 g/dL   Hematocrit 16.140.0 09.634.0 - 46.6 %   MCV 83 79 - 97 fL   MCH 28.1 26.6 - 33.0 pg   MCHC 33.8 31.5 - 35.7 g/dL   RDW 04.513.6 40.911.7 - 81.115.4 %   Platelets 316 150 - 450 x10E3/uL   Neutrophils 74 Not Estab. %   Lymphs 13 Not Estab. %   Monocytes 11 Not Estab. %   Eos 1 Not Estab. %   Basos 0 Not Estab. %   Neutrophils Absolute 5.2 1.4 - 7.0 x10E3/uL   Lymphocytes Absolute 0.9 0.7 - 3.1 x10E3/uL   Monocytes Absolute 0.8 0.1 - 0.9 x10E3/uL   EOS (ABSOLUTE) 0.1 0.0 - 0.4 x10E3/uL   Basophils Absolute 0.0 0.0 - 0.2 x10E3/uL   Immature Granulocytes 1 Not Estab. %   Immature Grans (Abs) 0.0 0.0 - 0.1 x10E3/uL   Assessment & Plan 1. Cough - promethazine-dextromethorphan (PROMETHAZINE-DM) 6.25-15 MG/5ML syrup; Take 2.5 mLs by mouth at bedtime.  Dispense: 118 mL; Refill: 0.  Continue to take prescribed antibiotics and steroid until course complete. Take prescribed tessalon in the am and promethazine-dm at night. Make sure to have adequate hydration and proper  rest. Stay away from sick contacts, doctor note for work provided. If symptoms worsen or accompanied by chest pain or shortness or breath, please follow-up or see ER.

## 2018-09-22 NOTE — Patient Instructions (Signed)
-   Restart daily breo, take tessalon perls during the day time and phenerganDM at night. Continue musinex and drink lots of water.  Please take your antibiotic as prescribed with food on your stomach to prevent nausea and upset stomach. Take the antibiotic for the entire course, even if your symptoms resolve before the course if completed. Using antibiotics inappropriately can make it harder to treat future infections. If you have any concerning side effects please stop the medication and let us know immediately. I do recommend taking a probiotic to replenish your good gut health anytime you take an antibiotic. You can get probiotics in types of yogurt like activia, or other food/drinks such as kimchi, kombucha , sauerkraut, or you can take an over the counter supplement.   Your body is so smart and strong that it will be fighting this illness off for you but it is important that you drink plenty of fluids, rest. Cover your nose/mouth when you cough or sneeze and wash your hands well and often. Here are some helpful things you can use or pick up over the counter from the pharmacy to help with your symptoms:   For Fever/Pain: Acetaminophen every 6 hours as needed (maximum of 3000mg  a day). If you are still uncomfortable you can add ibuprofen OR naproxen  For coughing: try dextromethorphan for a cough suppressant, and/or a cool mist humidifier, lozenges  For sore throat: saline gargles, honey herbal tea, lozenges, throat spray  To dry out your nose: try an antihistamine like loratadine (non-sedating) or diphenhydramine (sedating) or others To relieve a stuffy nose: try an oral decongestant  Like pseudoephedrine if you are under the age of 6 and do not have high blood pressure, neti pot To make blowing your nose easier and relieve chest congestion: guaifenesin 400mg  every 4-6 hours of guaifenesin ER [phone] mg every 12 hours. Do not take more than 2,400mg  a day.

## 2018-09-22 NOTE — Telephone Encounter (Signed)
Let patient know that CXR looks good, also we did refill Breo for her. Sent RX to [facility]. Nothing further needed at this time.

## 2018-09-22 NOTE — Telephone Encounter (Signed)
Pt called in office in reference to [facility] inhaler refills. Stated that she is out. Pharmacy- [facility] [address] [phone]

## 2018-09-22 NOTE — Telephone Encounter (Signed)
-----   Message from Salena Saner, MD sent at 09/20/2018  6:47 PM EST ----- Chest Xray looks good

## 2018-09-27 ENCOUNTER — Encounter: Payer: Self-pay | Admitting: Family Medicine

## 2018-09-27 ENCOUNTER — Ambulatory Visit: Payer: Managed Care, Other (non HMO) | Admitting: Family Medicine

## 2018-09-27 VITALS — BP 110/66 | HR 116 | Temp 98.3°F | Resp 16 | Ht 63.0 in | Wt 151.0 lb

## 2018-09-27 DIAGNOSIS — Z23 Encounter for immunization: Secondary | ICD-10-CM

## 2018-09-27 DIAGNOSIS — Z889 Allergy status to unspecified drugs, medicaments and biological substances status: Secondary | ICD-10-CM | POA: Diagnosis not present

## 2018-09-27 DIAGNOSIS — J4521 Mild intermittent asthma with (acute) exacerbation: Secondary | ICD-10-CM

## 2018-09-27 DIAGNOSIS — K219 Gastro-esophageal reflux disease without esophagitis: Secondary | ICD-10-CM | POA: Diagnosis not present

## 2018-09-27 DIAGNOSIS — R000 Tachycardia, unspecified: Secondary | ICD-10-CM

## 2018-09-27 NOTE — Progress Notes (Signed)
Name: [name] [name]   MRN: [id number]    DOB: [birth year]   Date:09/27/2018       Progress Note  Subjective  Chief Complaint  Chief Complaint  Patient presents with  . Asthma  . Cough    unresolved waxes and wanes.   . Generalized Body Aches    HPI  Cough/Asthma/URI: she was admitted 08/16/2018 and discharged on 08/18/2018. Discharge diagnosis of atypical pneumonia and acute bronchitis. She was seen in our office for follow up on 08/23/2018 for hospital discharge follow up, she was still having a dry cough, wheezing, chest tightness, nasal congestion and post-nasal drainage and was referred to pulmonologist. She saw Dr. Jayme CloudGonzalez on 09/02/2018 who recommended Breo and also to resume Pantoprazole , she also gave her Azithromycin for acute sinusitis and anti-inflammatory properties. Her time off work was extended until 09/05/2018. She went to back to work until 09/20/2018 when she had worsening of symptoms again and was seen at [facility] she checked CXR and CBC that were normal. She came back to our office and was seen by Sharyon CableElizabeth Poulose on 02/12   who gave her promethazine-dextromtdhrophan and advised to finish antibiotics ( augmentin and prednisone taper and tessalon perreles ). She is still not back to normal self. She still has nasal congestion, rhinorrhea, dry cough, feeling tired, body aches. Spirometry today was normal. Explained that she needs to follow up with pulmonologist and consider having allergy testing done again. Explained that is typical to continue to have a cough after an URI when patient has asthma.    Patient Active Problem List   Diagnosis Date Noted  . Chest pain with low risk for cardiac etiology 08/17/2018  . Asthma, intermittent with acute exacerbation 07/19/2018  . 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 Post-traumatic osteoarthritis of right foot 09/08/2016  . Endometriosis of pelvis 01/05/2013    Past Surgical History:   Procedure Laterality Date  . ANKLE SURGERY    . ANKLE SURGERY Right 06/11/2017  . APPENDECTOMY  2008  . CESAREAN SECTION  1995  . CHOLECYSTECTOMY  2000  . LAPAROSCOPIC ENDOMETRIOSIS FULGURATION    . LAPAROSCOPY     for endometriosis  . WISDOM TOOTH EXTRACTION     x2    Family History  Problem Relation Age of Onset  . COPD Mother   . Emphysema Mother   . Cancer Father        unknown  . Cancer Paternal Aunt        ovarian    Social History   Socioeconomic History  . Marital status: Single    Spouse name: Not on file  . Number of children: 1  . Years of education: Not on file  . Highest education level: Bachelor's degree (e.g., BA, AB, BS)  Occupational History  . Not on file  Social Needs  . Financial resource strain: Not hard at all  . Food insecurity:    Worry: Never true    Inability: Never true  . Transportation needs:    Medical: No    Non-medical: No  Tobacco Use  . Smoking status: Never Smoker  . Smokeless tobacco: Never Used  Substance and Sexual Activity  . Alcohol use: No    Alcohol/week: 0.0 standard drinks  . Drug use: No  . Sexual activity: Yes    Birth control/protection: Inserts    Comment: Nuva ring  Lifestyle  . Physical activity:    Days per week: 0 days  Minutes per session: 0 min  . Stress: Only a little  Relationships  . Social connections:    Talks on phone: More than three times a week    Gets together: More than three times a week    Attends religious service: More than 4 times per year    Active member of club or organization: No    Attends meetings of clubs or organizations: Never    Relationship status: Never married  . Intimate partner violence:    Fear of current or ex partner: No    Emotionally abused: No    Physically abused: No    Forced sexual activity: No  Other Topics Concern  . Not on file  Social History Narrative  . Not on file     Current Outpatient Medications:  .  albuterol (PROVENTIL HFA;VENTOLIN  HFA) 108 (90 Base) MCG/ACT inhaler, Inhale 2 puffs into the lungs every 6 (six) hours as needed for wheezing or shortness of breath., Disp: 1 Inhaler, Rfl: 0 .  amoxicillin-clavulanate (AUGMENTIN) 875-125 MG tablet, Take 1 tablet by mouth 2 (two) times daily., Disp: 20 tablet, Rfl: 0 .  aspirin EC 325 MG EC tablet, Take 1 tablet (325 mg total) by mouth 2 (two) times daily., Disp: 60 tablet, Rfl: 0 .  benzonatate (TESSALON) 100 MG capsule, Take 1 capsule (100 mg total) by mouth 2 (two) times daily as needed for cough., Disp: 20 capsule, Rfl: 0 .  cyclobenzaprine (FLEXERIL) 5 MG tablet, Take 1 tablet (5 mg total) by mouth at bedtime. (Patient taking differently: Take 5 mg by mouth at bedtime as needed. ), Disp: 30 tablet, Rfl: 0 .  fluticasone (FLONASE) 50 MCG/ACT nasal spray, Place 2 sprays into both nostrils daily., Disp: 16 g, Rfl: 6 .  fluticasone furoate-vilanterol (BREO ELLIPTA) 100-25 MCG/INH AEPB, INHALE 1 PUFF BY MOUTH ONCE DAILY, Disp: 60 each, Rfl: 3 .  ibuprofen (ADVIL,MOTRIN) 800 MG tablet, Take 1 tablet (800 mg total) by mouth every 8 (eight) hours as needed., Disp: 60 tablet, Rfl: 1 .  levocetirizine (XYZAL) 5 MG tablet, Take 1 tablet (5 mg total) by mouth daily as needed for allergies. (Patient taking differently: Take 5 mg by mouth at bedtime. ), Disp: 30 tablet, Rfl: 11 .  montelukast (SINGULAIR) 10 MG tablet, Take 1 tablet (10 mg total) by mouth at bedtime. (Patient taking differently: Take 10 mg by mouth daily. ), Disp: 90 tablet, Rfl: 1 .  NUVARING 0.12-0.015 MG/24HR vaginal ring, Place 1 each vaginally every 28 (twenty-eight) days. Use in continuous fashion for endometriosis, Disp: 3 each, Rfl: 6 .  pantoprazole (PROTONIX) 40 MG tablet, Take 1 tablet (40 mg total) by mouth daily., Disp: 30 tablet, Rfl: 2 .  promethazine-dextromethorphan (PROMETHAZINE-DM) 6.25-15 MG/5ML syrup, Take 2.5 mLs by mouth at bedtime., Disp: 118 mL, Rfl: 0 .  triamcinolone cream (KENALOG) 0.1 %, Apply to  itchy bump bid as needed, Disp: , Rfl:   Allergies  Allergen Reactions  . Mosquito (Diagnostic) Swelling    I personally reviewed active problem list, medication list, allergies, family history, social history with the patient/caregiver today.   ROS  Ten systems reviewed and is negative except as mentioned in HPI   Objective  Vitals:   09/27/18 1448 09/27/18 1537  BP: 110/66   Pulse: (!) 114 (!) 116  Resp: 16   Temp: 98.3 F (36.8 C)   TempSrc: Oral   SpO2: 98% 99%  Weight: 151 lb (68.5 kg)   Height: 5\' 3"  (1.6  m)     Body mass index is 26.75 kg/m.  Physical Exam  Constitutional: Patient appears well-developed and well-nourished. No distress.  HEENT: head atraumatic, normocephalic, pupils equal and reactive to light, ears normal TM bilaterally, neck supple, throat within normal limits, boggy turbinates  Cardiovascular: Normal rate, regular rhythm and normal heart sounds.  No murmur heard. No BLE edema. Pulmonary/Chest: Effort normal and breath sounds normal. No respiratory distress. Abdominal: Soft.  There is no tenderness. Psychiatric: Patient has a normal mood and affect. behavior is normal. Judgment and thought content normal.  Recent Results (from the past 2160 hour(s))  Basic metabolic panel     Status: Abnormal   Collection Time: 08/16/18  5:35 PM  Result Value Ref Range   Sodium 138 135 - 145 mmol/L   Potassium 4.4 3.5 - 5.1 mmol/L   Chloride 107 98 - 111 mmol/L   CO2 25 22 - 32 mmol/L   Glucose, Bld 93 70 - 99 mg/dL   BUN 13 6 - 20 mg/dL   Creatinine, Ser 1.61 0.44 - 1.00 mg/dL   Calcium 8.8 (L) 8.9 - 10.3 mg/dL   GFR calc non Af Amer >60 >60 mL/min   GFR calc Af Amer >60 >60 mL/min   Anion gap 6 5 - 15    Comment: Performed at [facility], [address] [place], [place] [address]  CBC     Status: Abnormal   Collection Time: 08/16/18  5:35 PM  Result Value Ref Range   WBC 7.1 4.0 - 10.5 K/uL   RBC 5.35 (H) 3.87 - 5.11 MIL/uL    Hemoglobin 15.2 (H) 12.0 - 15.0 g/dL   HCT 54.0 (H) 98.1 - 19.1 %   MCV 87.7 80.0 - 100.0 fL   MCH 28.4 26.0 - 34.0 pg   MCHC 32.4 30.0 - 36.0 g/dL   RDW 47.8 29.5 - 62.1 %   Platelets 387 150 - 400 K/uL   nRBC 0.0 0.0 - 0.2 %    Comment: Performed at [facility], [address] [place], [place] [address]  Troponin I - ONCE - STAT     Status: None   Collection Time: 08/16/18  5:35 PM  Result Value Ref Range   Troponin I <0.03 <0.03 ng/mL    Comment: Performed at [facility], [address] [place], [place] [address]  Fibrin derivatives D-Dimer [facility] only)     Status: Abnormal   Collection Time: 08/16/18  5:35 PM  Result Value Ref Range   Fibrin derivatives D-dimer ([facility]) 5,420.53 (H) 0.00 - 499.00 ng/mL (FEU)    Comment: (NOTE) <> Exclusion of Venous Thromboembolism (VTE) - OUTPATIENT ONLY   (Emergency Department or [place])   0-499 ng/ml (FEU): With a low to intermediate pretest probability                      for VTE this test result excludes the diagnosis                      of VTE.   >499 ng/ml (FEU) : VTE not excluded; additional work up for VTE is                      required. <> Testing on Inpatients and Evaluation of Disseminated Intravascular   Coagulation (DIC) Reference Range:   0-499 ng/ml (FEU) Performed at [facility], [address], [place], [place] [address]  Hepatic function panel     Status: None   Collection Time: 08/16/18  5:35 PM  Result Value Ref Range   Total Protein 7.1 6.5 - 8.1 g/dL   Albumin 3.6 3.5 - 5.0 g/dL   AST 22 15 - 41 U/L   ALT 21 0 - 44 U/L   Alkaline Phosphatase 72 38 - 126 U/L   Total Bilirubin 0.5 0.3 - 1.2 mg/dL   Bilirubin, Direct <1.6 0.0 - 0.2 mg/dL   Indirect Bilirubin NOT CALCULATED 0.3 - 0.9 mg/dL    Comment: Performed at [facility], [address] [place], [place] [address]  Magnesium     Status: Abnormal   Collection Time: 08/16/18  5:35 PM  Result Value Ref Range    Magnesium 2.5 (H) 1.7 - 2.4 mg/dL    Comment: Performed at [facility], [address] [place], [place] [address]  Phosphorus     Status: None   Collection Time: 08/16/18  5:35 PM  Result Value Ref Range   Phosphorus 3.8 2.5 - 4.6 mg/dL    Comment: Performed at [facility], [address] [place], [place] [address]  Pregnancy, urine     Status: None   Collection Time: 08/16/18  8:53 PM  Result Value Ref Range   Preg Test, Ur NEGATIVE NEGATIVE    Comment: Performed at [facility], [address] [place], [place] [address]  Pregnancy, urine POC     Status: None   Collection Time: 08/16/18  9:45 PM  Result Value Ref Range   Preg Test, Ur NEGATIVE NEGATIVE    Comment:        THE SENSITIVITY OF THIS METHODOLOGY IS >24 mIU/mL   Troponin I - Once     Status: None   Collection Time: 08/17/18  2:46 AM  Result Value Ref Range   Troponin I <0.03 <0.03 ng/mL    Comment: Performed at [facility], [address] [place], [place] [address]  Respiratory Panel by PCR     Status: None   Collection Time: 08/17/18  2:50 AM  Result Value Ref Range   Adenovirus NOT DETECTED NOT DETECTED   Coronavirus 229E NOT DETECTED NOT DETECTED   Coronavirus HKU1 NOT DETECTED NOT DETECTED   Coronavirus NL63 NOT DETECTED NOT DETECTED   Coronavirus OC43 NOT DETECTED NOT DETECTED   Metapneumovirus NOT DETECTED NOT DETECTED   Rhinovirus / Enterovirus NOT DETECTED NOT DETECTED   Influenza A NOT DETECTED NOT DETECTED   Influenza B NOT DETECTED NOT DETECTED   Parainfluenza Virus 1 NOT DETECTED NOT DETECTED   Parainfluenza Virus 2 NOT DETECTED NOT DETECTED   Parainfluenza Virus 3 NOT DETECTED NOT DETECTED   Parainfluenza Virus 4 NOT DETECTED NOT DETECTED   Respiratory Syncytial Virus NOT DETECTED NOT DETECTED   Bordetella pertussis NOT DETECTED NOT DETECTED   Chlamydophila pneumoniae NOT DETECTED NOT DETECTED   Mycoplasma pneumoniae NOT DETECTED NOT DETECTED     Comment: Performed at [facility], [address] [place], [place] [address]  Influenza panel by PCR (type A & B)     Status: None   Collection Time: 08/17/18  2:50 AM  Result Value Ref Range   Influenza A By PCR NEGATIVE NEGATIVE   Influenza B By PCR NEGATIVE NEGATIVE    Comment: (NOTE) The Xpert Xpress Flu assay is intended as an aid in the diagnosis of  influenza and should not be used as a sole basis for treatment.  This  assay is FDA approved for nasopharyngeal swab specimens only. Nasal  washings and aspirates are unacceptable for Xpert Xpress Flu testing. Performed at [facility], [address] [place], [place] [address]   Lactic acid, plasma     Status: None   Collection Time: 08/17/18  4:40 AM  Result Value Ref Range   Lactic Acid, Venous 1.1 0.5 - 1.9 mmol/L    Comment: Performed at [facility], [address] [place], [place] [address]  Procalcitonin - Baseline     Status: None   Collection Time: 08/17/18  4:40 AM  Result Value Ref Range   Procalcitonin <0.10 ng/mL    Comment:        Interpretation: PCT (Procalcitonin) <= 0.5 ng/mL: Systemic infection (sepsis) is not likely. Local bacterial infection is possible. (NOTE)       Sepsis PCT Algorithm           Lower Respiratory Tract                                      Infection PCT Algorithm    ----------------------------     ----------------------------         PCT < 0.25 ng/mL                PCT < 0.10 ng/mL         Strongly encourage             Strongly discourage   discontinuation of antibiotics    initiation of antibiotics    ----------------------------     -----------------------------       PCT 0.25 - 0.50 ng/mL            PCT 0.10 - 0.25 ng/mL               OR       >80% decrease in PCT            Discourage initiation of                                            antibiotics      Encourage discontinuation           of antibiotics    ----------------------------      -----------------------------         PCT >= 0.50 ng/mL              PCT 0.26 - 0.50 ng/mL               AND        <80% decrease in PCT             Encourage initiation of                                             antibiotics       Encourage continuation           of antibiotics    ----------------------------     -----------------------------        PCT >= 0.50 ng/mL  PCT > 0.50 ng/mL               AND         increase in PCT                  Strongly encourage                                      initiation of antibiotics    Strongly encourage escalation           of antibiotics                                     -----------------------------                                           PCT <= 0.25 ng/mL                                                 OR                                        > 80% decrease in PCT                                     Discontinue / Do not initiate                                             antibiotics Performed at [facility], [address] [place], [place] [address]   Calcium, ionized     Status: None   Collection Time: 08/17/18  4:40 AM  Result Value Ref Range   Calcium, Ionized, Serum 5.1 4.5 - 5.6 mg/dL    Comment: (NOTE) Performed At: [facility] [address] [place], [place] [id number] Jolene Schimke MD WY:[id number]   ECHOCARDIOGRAM COMPLETE     Status: None   Collection Time: 08/17/18  8:11 PM  Result Value Ref Range   Weight 2,416 oz   Height 63 in   BP 126/78 mmHg  CBC with Diff     Status: None   Collection Time: 09/20/18 12:00 PM  Result Value Ref Range   WBC 7.0 3.4 - 10.8 x10E3/uL   RBC 4.80 3.77 - 5.28 x10E6/uL   Hemoglobin 13.5 11.1 - 15.9 g/dL   Hematocrit 77.4 12.8 - 46.6 %   MCV 83 79 - 97 fL   MCH 28.1 26.6 - 33.0 pg   MCHC 33.8 31.5 - 35.7 g/dL   RDW 78.6 76.7 - 20.9 %   Platelets 316 150 - 450 x10E3/uL   Neutrophils 74 Not Estab. %   Lymphs 13 Not Estab. %   Monocytes  11 Not Estab. %   Eos 1 Not Estab. %   Basos 0 Not  Estab. %   Neutrophils Absolute 5.2 1.4 - 7.0 x10E3/uL   Lymphocytes Absolute 0.9 0.7 - 3.1 x10E3/uL   Monocytes Absolute 0.8 0.1 - 0.9 x10E3/uL   EOS (ABSOLUTE) 0.1 0.0 - 0.4 x10E3/uL   Basophils Absolute 0.0 0.0 - 0.2 x10E3/uL   Immature Granulocytes 1 Not Estab. %   Immature Grans (Abs) 0.0 0.0 - 0.1 x10E3/uL     PHQ2/9: Depression screen [facility] 2/9 09/22/2018 05/06/2018 12/16/2017 11/10/2017  Decreased Interest 0 0 0 0  Down, Depressed, Hopeless 0 0 0 0  PHQ - 2 Score 0 0 0 0  Altered sleeping - 0 - -  Tired, decreased energy - 0 - -  Change in appetite - 0 - -  Feeling bad or failure about yourself  - 0 - -  Trouble concentrating - 0 - -  Moving slowly or fidgety/restless - 0 - -  Suicidal thoughts - 0 - -  PHQ-9 Score - 0 - -  Difficult doing work/chores - Not difficult at all - -     Fall Risk: Fall Risk  09/22/2018 07/19/2018 05/06/2018 12/16/2017 11/10/2017  Falls in the past year? 0 0 Yes No No  Number falls in past yr: 0 0 1 - -  Injury with Fall? 0 - No - -      Assessment & Plan  1. Mild intermittent asthma with acute exacerbation  Still coughing.   2. Gastroesophageal reflux disease without esophagitis  Continue PPI  3. Multiple allergies  Discussed going back to allergies, she will discuss with pulmonologist  4. Need for Tdap vaccination  - Tdap vaccine greater than or equal to 7yo IM   5. Tachycardia  Even after rest, reviewed all tests, she did use albuterol today, she has follow up with pulmonologist on Friday and advised to get it recheck while she is there .

## 2018-10-01 ENCOUNTER — Ambulatory Visit: Payer: Self-pay | Admitting: Pulmonary Disease

## 2018-10-05 ENCOUNTER — Ambulatory Visit: Payer: Managed Care, Other (non HMO) | Admitting: Pulmonary Disease

## 2018-10-05 ENCOUNTER — Encounter: Payer: Self-pay | Admitting: Pulmonary Disease

## 2018-10-05 VITALS — BP 118/70 | HR 82 | Ht 63.0 in | Wt 152.2 lb

## 2018-10-05 DIAGNOSIS — J3089 Other allergic rhinitis: Secondary | ICD-10-CM

## 2018-10-05 DIAGNOSIS — J4520 Mild intermittent asthma, uncomplicated: Secondary | ICD-10-CM

## 2018-10-05 DIAGNOSIS — R059 Cough, unspecified: Secondary | ICD-10-CM

## 2018-10-05 DIAGNOSIS — R05 Cough: Secondary | ICD-10-CM

## 2018-10-05 DIAGNOSIS — K219 Gastro-esophageal reflux disease without esophagitis: Secondary | ICD-10-CM | POA: Diagnosis not present

## 2018-10-05 MED ORDER — BUDESONIDE-FORMOTEROL FUMARATE 160-4.5 MCG/ACT IN AERO: 2.0000 | INHALATION_SPRAY | Freq: Two times a day (BID) | RESPIRATORY_TRACT | 6 refills | Status: DC

## 2018-10-05 MED ORDER — BUDESONIDE-FORMOTEROL FUMARATE 160-4.5 MCG/ACT IN AERO: 2.0000 | INHALATION_SPRAY | Freq: Every day | RESPIRATORY_TRACT | 12 refills | Status: DC

## 2018-10-05 MED ORDER — SPACER/AERO-HOLDING CHAMBERS DEVI: 1.0000 | Freq: Every day | 0 refills | Status: AC

## 2018-10-05 NOTE — Progress Notes (Signed)
Subjective:    Patient ID: [name] [name], female    DOB: 02-20-1971, 48 y.o.   MRN: [id number]  HPI Patient is a 48 year old lifelong never smoker with a history of asthma who presents after recent hospitalization at [facility] for pneumonia.   The patient had started in December have issues with worsening shortness of breath that she attributed to asthma exacerbation.  Her inhalers however were not working at that time she was mainly using albuterol and at that time Brio Ellipta was also added.  Neither of these inhalers worked well as noted.  She also noticed increasing issues with headaches and sinus drainage.  He subsequently came to the emergency room on 6 January because of a headache and chest pain.  Her blood pressure was noted to be elevated according to the patient.  The patient had a chest CT at the time of admission at [facility] was noted to have bilateral patchy infiltrates consistent with either viral or inflammatory pneumonitis.  She was treated for an asthma exacerbation associated with acute pneumonitis.  She was subsequently discharged on 8 January.  She was given a steroid taper and antibiotics.   We saw her initially on 23 January when she had had a relapse of symptoms.  She was started on Protonix for symptoms consistent with LPR.  She also was noted to have what appeared to be an acute maxillary sinusitis and was given antibiotics at that time.  She noted improvement until she went back to work and her symptoms of dry cough started again.  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  She wonders if there may be an element of allergy involved.  She has seen an allergist previously but does not recall the name.  She does note that they were with [facility].  Since she has returned to work as noted her cough has restarted.  She also notices increasing  problems with sinus congestion and postnasal drip.  Protonix is controlling her reflux symptoms.  She had pulmonary function testing performed on 30 January.  This did not show any obstructive physiology.  Testing was essentially normal.  FEV1 was 2.25 L or 95% predicted and FEV1/FVC was 90% predicted.   Review of Systems  HENT: Positive for congestion and postnasal drip.   Eyes: Negative.   Respiratory: Positive for cough.   Cardiovascular: Negative.   Gastrointestinal: Negative.        No reflux symptoms on Protonix  Allergic/Immunologic: Positive for environmental allergies.  All other systems reviewed and are negative.      Objective:   Physical Exam Vitals signs reviewed.  Constitutional:      General: She is not in acute distress.    Appearance: She is well-developed. She is not toxic-appearing.  HENT:     Head: Normocephalic and atraumatic.     Right Ear: A middle ear effusion (Serous) is present.     Left Ear: A middle ear effusion (Serous) is present.     Nose: Congestion present.     Right Turbinates: Swollen.     Left Turbinates: Swollen.     Right Sinus: Maxillary sinus tenderness present.     Left Sinus: Maxillary sinus tenderness present.     Mouth/Throat:     Mouth: Mucous membranes are moist.     Comments: Clear postnasal drip  noted Eyes:     General: No scleral icterus.    Conjunctiva/sclera: Conjunctivae normal.     Pupils: Pupils are equal, round, and reactive to light.  Neck:     Musculoskeletal: Neck supple.     Thyroid: No thyromegaly.     Vascular: No JVD.     Trachea: No tracheal deviation.  Cardiovascular:     Rate and Rhythm: Normal rate and regular rhythm.     Pulses: Normal pulses.     Heart sounds: Normal heart sounds.  Pulmonary:     Effort: Pulmonary effort is normal. No tachypnea.     Breath sounds: Normal breath sounds. No wheezing or rhonchi.  Abdominal:     General: Abdomen is flat. There is no distension.  Musculoskeletal:  Normal range of motion.     Right lower leg: No edema.     Left lower leg: No edema.  Lymphadenopathy:     Cervical: No cervical adenopathy.  Skin:    General: Skin is warm and dry.     Findings: No rash.  Neurological:     General: No focal deficit present.     Mental Status: She is alert and oriented to person, place, and time.  Psychiatric:        Mood and Affect: Mood normal.        Behavior: Behavior normal.     We performed spirometry today.  FEV1 was 2.0 L or 88% predicted FEV1/FVC was 83%.  No overt obstruction noted however her FEV1 is slightly decreased from prior.  Also mid flows appear slightly decreased from prior.      Assessment & Plan:   1.  Cough: This may be related to poorly controlled mild intermittent asthma.  We will perform changes as below.  She has noted some exacerbation with changes in environment.  See potential evaluation as below.  2.  Mild intermittent asthma with no exacerbation at present: We will switch her Brio Ellipta to Symbicort 160/4.5, 2 inhalations twice a day.  We provided her with a spacer.  She was instructed to rinse her mouth well after use.  She was taught the proper use of the inhaler and the spacer.  3.  Perennial allergic rhinitis: This may be aggravating her cough.  She appears to be exacerbated in certain locations.  I have recommended that she be reevaluated by allergy and immunology.  She notes that previously her skin testing was very positive.  She may need desensitization.  4.  Laryngopharyngeal reflux: This appears to be improved/controlled with Protonix.  Tinea the same for now.  See her in follow-up in 4 to 6 weeks time.  She is to contact us prior to that time should any new difficulties arise.

## 2018-10-05 NOTE — Patient Instructions (Addendum)
1.  We will have you see the allergist again.  2.  We are switching you from [facility] to Symbicort 160/4.5, 2 inhalations twice a day.  Use the spacer.  Be sure to rinse your mouth well after use.  You may put  a little baking soda in the rinse water.  This helps prevent thrush.  3.  We will see you in follow-up in 4 to 6 weeks time.

## 2018-10-06 ENCOUNTER — Encounter: Payer: Self-pay | Admitting: Family Medicine

## 2018-10-06 ENCOUNTER — Other Ambulatory Visit: Payer: Self-pay | Admitting: Family Medicine

## 2018-10-06 MED ORDER — FLUCONAZOLE 150 MG PO TABS: 150.0000 mg | ORAL_TABLET | ORAL | 0 refills | Status: DC

## 2018-10-07 ENCOUNTER — Encounter: Payer: Self-pay | Admitting: Podiatry

## 2018-10-11 ENCOUNTER — Encounter: Payer: Self-pay | Admitting: *Deleted

## 2018-10-11 NOTE — Telephone Encounter (Signed)
Letter permitting pt to drive long distance is emailed by Pasty Arch, Record Coordinator.

## 2018-10-15 ENCOUNTER — Telehealth: Payer: Self-pay | Admitting: Pulmonary Disease

## 2018-10-15 NOTE — Telephone Encounter (Signed)
Returned phone call to patient, made aware of Dr. Reece Agar recommendations. Patient states she has Breo at home that she can resume. Voiced understanding. Nothing further is needed at this time.

## 2018-10-15 NOTE — Telephone Encounter (Signed)
Called and spoke to pt.  Pt was instructed to switch from Breo to Symbicort during 10/05/18 office visit. Pt states within the past three days after using Symbicort, she develops worsen non prod cough and feels that her chest is itching.  Pt denied trouble swallowing or facial swelling.    I had advised pt to hold symbicort until receiving response from Dr. Jayme Cloud.  Dr. Jayme Cloud please advise. Thanks

## 2018-10-15 NOTE — Telephone Encounter (Signed)
Stop Symbicort and resume Breo.We can call Rx. In if she needs more.

## 2018-11-04 ENCOUNTER — Telehealth: Payer: Self-pay

## 2018-11-04 NOTE — Telephone Encounter (Signed)
LM for patient to call re: apt 11/09/18.

## 2018-11-05 ENCOUNTER — Other Ambulatory Visit: Payer: Self-pay | Admitting: Pulmonary Disease

## 2018-11-05 DIAGNOSIS — R059 Cough, unspecified: Secondary | ICD-10-CM

## 2018-11-05 DIAGNOSIS — R05 Cough: Secondary | ICD-10-CM

## 2018-11-08 ENCOUNTER — Telehealth: Payer: Self-pay

## 2018-11-08 NOTE — Telephone Encounter (Signed)
LM for patient to let us know how she would like to proceed for 11/09/18 visit.

## 2018-11-09 ENCOUNTER — Ambulatory Visit: Payer: Self-pay | Admitting: Pulmonary Disease

## 2018-12-10 ENCOUNTER — Other Ambulatory Visit: Payer: Self-pay | Admitting: Pulmonary Disease

## 2018-12-23 ENCOUNTER — Telehealth: Payer: Self-pay | Admitting: Pulmonary Disease

## 2018-12-23 MED ORDER — PANTOPRAZOLE SODIUM 40 MG PO TBEC: 40.0000 mg | DELAYED_RELEASE_TABLET | Freq: Every day | ORAL | 2 refills | Status: DC

## 2018-12-23 NOTE — Telephone Encounter (Signed)
Has been working from home, for the past few months with her full time job. She is supposed to start her part time job back today, at 5:00, and was wondering if she should go in to work. She has been using her Virgel Bouquet and states that has been working well, but has a little cough and her voice is raspy.  Dr. Jayme Cloud, can you advise whether she should start back her part time job at [employer] with her pulmonary condition.

## 2018-12-23 NOTE — Telephone Encounter (Signed)
Pt called in reference to medication refil request sent from pahrmacy for Pantoprazole Sodium that has not been filled yet.  She would also like Dr. Jayme Cloud to give her a call, she has a few questions that she did not want to disclose.  Pharmacy- [facility]

## 2018-12-27 ENCOUNTER — Telehealth: Payer: Self-pay | Admitting: Pulmonary Disease

## 2018-12-27 NOTE — Telephone Encounter (Signed)
Forwarded to [employer] triage.

## 2018-12-28 NOTE — Telephone Encounter (Signed)
Pt is calling back [phone]

## 2018-12-28 NOTE — Telephone Encounter (Signed)
Per Dr. Jayme Cloud verbally- okay to return to work, recommend wearing a mask.  Pt is aware of recommendations and voiced her understanding.  Nothing further is needed.

## 2018-12-28 NOTE — Telephone Encounter (Signed)
Please see 12/27/2018 phone note//ms

## 2018-12-28 NOTE — Telephone Encounter (Signed)
Discussed. Pt can return to work. Recommend wearing face mask.

## 2019-01-04 ENCOUNTER — Other Ambulatory Visit: Payer: Self-pay | Admitting: Nurse Practitioner

## 2019-01-04 DIAGNOSIS — Z889 Allergy status to unspecified drugs, medicaments and biological substances status: Secondary | ICD-10-CM

## 2019-01-12 ENCOUNTER — Ambulatory Visit (INDEPENDENT_AMBULATORY_CARE_PROVIDER_SITE_OTHER): Payer: Managed Care, Other (non HMO) | Admitting: Family Medicine

## 2019-01-12 ENCOUNTER — Encounter: Payer: Self-pay | Admitting: Family Medicine

## 2019-01-12 DIAGNOSIS — H9201 Otalgia, right ear: Secondary | ICD-10-CM | POA: Diagnosis not present

## 2019-01-12 DIAGNOSIS — Z1239 Encounter for other screening for malignant neoplasm of breast: Secondary | ICD-10-CM

## 2019-01-12 DIAGNOSIS — F4321 Adjustment disorder with depressed mood: Secondary | ICD-10-CM

## 2019-01-12 MED ORDER — AZITHROMYCIN 250 MG PO TABS: ORAL_TABLET | ORAL | 0 refills | Status: DC

## 2019-01-12 NOTE — Progress Notes (Signed)
Name: [name] [name]   MRN: [id number]    DOB: [birth year]   Date:01/12/2019       Progress Note  Subjective  Chief Complaint  Chief Complaint  Patient presents with  . Otitis Media     right ear x 4 days. Pain: 6/10: Eating and Yawning makes it works. Pain comes and goes.    I connected with  Tamryn Popko Mapp  on 01/12/19 at  9:20 AM EDT by a video enabled telemedicine application and verified that I am speaking with the correct person using two identifiers.  I discussed the limitations of evaluation and management by telemedicine and the availability of in person appointments. The patient expressed understanding and agreed to proceed. Staff also discussed with the patient that there may be a patient responsible charge related to this service. Patient Location: at home Provider Location: [facility]   HPI   Right ear pain: she states noticed right ear pain when she yawns and chews for the past 4-5 days, over the past two days also having nasal congestion, post-nasal drainage, and hoarseness. Denies cough, fever, wheezing or chills. Denies change in taste. No hearing loss but has noticed intermittent tinnitus on right side only. She had ear infections in the past and states it feels the same way  Grieving: her paternal aunt that raised her all her life died after knee surgery / PE back in March 2020, she also lost a sister Nov 200 and has been grieving, crying, difficulty sleeping, discussed hospice grieve counseling   Patient Active Problem List   Diagnosis Date Noted  . Chest pain with low risk for cardiac etiology 08/17/2018  . Asthma, intermittent with acute exacerbation 07/19/2018  . 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 Post-traumatic osteoarthritis of right foot 09/08/2016  . Endometriosis of pelvis 01/05/2013    Past Surgical History:  Procedure Laterality Date  . ANKLE SURGERY    . ANKLE SURGERY Right 06/11/2017  .  APPENDECTOMY  2008  . CESAREAN SECTION  1995  . CHOLECYSTECTOMY  2000  . LAPAROSCOPIC ENDOMETRIOSIS FULGURATION    . LAPAROSCOPY     for endometriosis  . WISDOM TOOTH EXTRACTION     x2    Family History  Problem Relation Age of Onset  . COPD Mother   . Emphysema Mother   . Cancer Father        unknown  . Cancer Paternal Aunt        ovarian    Social History   Socioeconomic History  . Marital status: Single    Spouse name: Not on file  . Number of children: 1  . Years of education: Not on file  . Highest education level: Bachelor's degree (e.g., BA, AB, BS)  Occupational History  . Not on file  Social Needs  . Financial resource strain: Not hard at all  . Food insecurity:    Worry: Never true    Inability: Never true  . Transportation needs:    Medical: No    Non-medical: No  Tobacco Use  . Smoking status: Never Smoker  . Smokeless tobacco: Never Used  Substance and Sexual Activity  . Alcohol use: No    Alcohol/week: 0.0 standard drinks  . Drug use: No  . Sexual activity: Yes    Birth control/protection: Inserts    Comment: Nuva ring  Lifestyle  . Physical activity:    Days per week: 0 days    Minutes per  session: 0 min  . Stress: Only a little  Relationships  . Social connections:    Talks on phone: More than three times a week    Gets together: More than three times a week    Attends religious service: More than 4 times per year    Active member of club or organization: No    Attends meetings of clubs or organizations: Never    Relationship status: Never married  . Intimate partner violence:    Fear of current or ex partner: No    Emotionally abused: No    Physically abused: No    Forced sexual activity: No  Other Topics Concern  . Not on file  Social History Narrative  . Not on file     Current Outpatient Medications:  .  albuterol (PROVENTIL HFA;VENTOLIN HFA) 108 (90 Base) MCG/ACT inhaler, Inhale 2 puffs into the lungs every 6 (six) hours  as needed for wheezing or shortness of breath., Disp: 1 Inhaler, Rfl: 0 .  cyclobenzaprine (FLEXERIL) 5 MG tablet, Take 1 tablet (5 mg total) by mouth at bedtime. (Patient taking differently: Take 5 mg by mouth at bedtime as needed. ), Disp: 30 tablet, Rfl: 0 .  fluticasone (FLONASE) 50 MCG/ACT nasal spray, Place 2 sprays into both nostrils daily., Disp: 16 g, Rfl: 6 .  fluticasone furoate-vilanterol (BREO ELLIPTA) 100-25 MCG/INH AEPB, Inhale 1 puff into the lungs daily., Disp: , Rfl:  .  ibuprofen (ADVIL,MOTRIN) 800 MG tablet, Take 1 tablet (800 mg total) by mouth every 8 (eight) hours as needed., Disp: 60 tablet, Rfl: 1 .  levocetirizine (XYZAL) 5 MG tablet, Take 1 tablet (5 mg total) by mouth daily as needed for allergies. (Patient taking differently: Take 5 mg by mouth at bedtime. ), Disp: 30 tablet, Rfl: 11 .  montelukast (SINGULAIR) 10 MG tablet, Take 1 tablet (10 mg total) by mouth daily., Disp: 90 tablet, Rfl: 0 .  NUVARING 0.12-0.015 MG/24HR vaginal ring, Place 1 each vaginally every 28 (twenty-eight) days. Use in continuous fashion for endometriosis, Disp: 3 each, Rfl: 6 .  pantoprazole (PROTONIX) 40 MG tablet, Take 1 tablet (40 mg total) by mouth daily., Disp: 30 tablet, Rfl: 2 .  Spacer/Aero-Holding Chambers DEVI, 1 each by Does not apply route daily., Disp: 1 each, Rfl: 0 .  triamcinolone cream (KENALOG) 0.1 %, Apply to itchy bump bid as needed, Disp: , Rfl:  .  budesonide-formoterol (SYMBICORT) 160-4.5 MCG/ACT inhaler, Inhale 2 puffs into the lungs 2 (two) times daily. (Patient not taking: Reported on 01/12/2019), Disp: 1 Inhaler, Rfl: 6 .  budesonide-formoterol (SYMBICORT) 160-4.5 MCG/ACT inhaler, Inhale 2 puffs into the lungs daily. (Patient not taking: Reported on 01/12/2019), Disp: 1 Inhaler, Rfl: 12 .  fluconazole (DIFLUCAN) 150 MG tablet, Take 1 tablet (150 mg total) by mouth every other day. (Patient not taking: Reported on 01/12/2019), Disp: 3 tablet, Rfl: 0 .   promethazine-dextromethorphan (PROMETHAZINE-DM) 6.25-15 MG/5ML syrup, Take 2.5 mLs by mouth at bedtime. (Patient not taking: Reported on 01/12/2019), Disp: 118 mL, Rfl: 0  Allergies  Allergen Reactions  . Symbicort [Budesonide-Formoterol Fumarate] Itching  . Mosquito (Diagnostic) Swelling    I personally reviewed active problem list, medication list, allergies, family history, social history with the patient/caregiver today.   ROS  Ten systems reviewed and is negative except as mentioned in HPI  Objective  Virtual encounter, vitals not obtained.  There is no height or weight on file to calculate BMI.  Physical Exam  Awake, alert and oriented  She has tenderness when abducting her jaw and with palpation of right TMJ but pain radiates to right side of neck.  She sounds stuffy and congested  PHQ2/9: Depression screen [facility] 2/9 01/12/2019 09/22/2018 05/06/2018 12/16/2017 11/10/2017  Decreased Interest 0 0 0 0 0  Down, Depressed, Hopeless 0 0 0 0 0  PHQ - 2 Score 0 0 0 0 0  Altered sleeping 3 - 0 - -  Tired, decreased energy 2 - 0 - -  Change in appetite 0 - 0 - -  Feeling bad or failure about yourself  0 - 0 - -  Trouble concentrating 1 - 0 - -  Moving slowly or fidgety/restless 0 - 0 - -  Suicidal thoughts 0 - 0 - -  PHQ-9 Score 6 - 0 - -  Difficult doing work/chores Somewhat difficult - Not difficult at all - -   PHQ-2/9 Result is negative.  Grieving    Fall Risk: Fall Risk  09/22/2018 07/19/2018 05/06/2018 12/16/2017 11/10/2017  Falls in the past year? 0 0 Yes No No  Number falls in past yr: 0 0 1 - -  Injury with Fall? 0 - No - -     Assessment & Plan  1. Acute otalgia, right  - azithromycin (ZITHROMAX) 250 MG tablet; Take 2 first day and after that one daily  Dispense: 6 tablet; Refill: 0  2. Breast cancer screening  - MM Digital Screening; Future  3. Grieving   I discussed the assessment and treatment plan with the patient. The patient was provided an opportunity to  ask questions and all were answered. The patient agreed with the plan and demonstrated an understanding of the instructions.  The patient was advised to call back or seek an in-person evaluation if the symptoms worsen or if the condition fails to improve as anticipated.  I provided 15  minutes of non-face-to-face time during this encounter.

## 2019-02-01 ENCOUNTER — Encounter: Payer: Managed Care, Other (non HMO) | Admitting: Family Medicine

## 2019-02-03 ENCOUNTER — Telehealth: Payer: Managed Care, Other (non HMO) | Admitting: Physician Assistant

## 2019-02-03 DIAGNOSIS — J029 Acute pharyngitis, unspecified: Secondary | ICD-10-CM

## 2019-02-03 NOTE — Progress Notes (Signed)
I have spent 5 minutes in review of e-visit questionnaire, review and updating patient chart, medical decision making and response to patient.   Aleksei Goodlin Cody Aivah Putman, PA-C    

## 2019-02-03 NOTE — Progress Notes (Signed)

## 2019-02-04 ENCOUNTER — Encounter: Payer: Self-pay | Admitting: Family Medicine

## 2019-02-07 ENCOUNTER — Ambulatory Visit (INDEPENDENT_AMBULATORY_CARE_PROVIDER_SITE_OTHER): Payer: Managed Care, Other (non HMO) | Admitting: Nurse Practitioner

## 2019-02-07 ENCOUNTER — Other Ambulatory Visit: Payer: Self-pay

## 2019-02-07 ENCOUNTER — Encounter: Payer: Self-pay | Admitting: Nurse Practitioner

## 2019-02-07 VITALS — Temp 98.1°F

## 2019-02-07 DIAGNOSIS — T3695XA Adverse effect of unspecified systemic antibiotic, initial encounter: Secondary | ICD-10-CM

## 2019-02-07 DIAGNOSIS — B379 Candidiasis, unspecified: Secondary | ICD-10-CM

## 2019-02-07 DIAGNOSIS — J0140 Acute pansinusitis, unspecified: Secondary | ICD-10-CM

## 2019-02-07 MED ORDER — AMOXICILLIN-POT CLAVULANATE 875-125 MG PO TABS: 1.0000 | ORAL_TABLET | Freq: Two times a day (BID) | ORAL | 0 refills | Status: DC

## 2019-02-07 MED ORDER — DM-GUAIFENESIN ER 30-600 MG PO TB12: 1.0000 | ORAL_TABLET | Freq: Two times a day (BID) | ORAL | 0 refills | Status: DC

## 2019-02-07 MED ORDER — FLUCONAZOLE 150 MG PO TABS: 150.0000 mg | ORAL_TABLET | Freq: Once | ORAL | 1 refills | Status: AC

## 2019-02-07 NOTE — Progress Notes (Signed)
Virtual Visit via Video Note  I connected with [name] [name] on 02/07/19 at  1:40 PM EDT by a video enabled telemedicine application and verified that I am speaking with the correct person using two identifiers.   Staff discussed the limitations of evaluation and management by telemedicine and the availability of in person appointments. The patient expressed understanding and agreed to proceed.  Patient location: home  My location: work office Other people present:  none HPI  Patient endorses a lot of facial fullness and congestion. States she has lots of nasal congestion- mucous. Sore throat- scratchy throat, ear fullness, mild dry cough. She had evisit last week and was diagnosed with pharyngitis- was instructed to take antihistamine and tylenol and ibuprofen. States choloroseptic lozenge helped some with sore throat. Patient states had second worsening- felt like it was going to improve a few days ago and voice improved but states feels it is getting worse- head fullness has increased.  Denies shortness of breath, chest tightness, fevers, chills, nausea, vomiting, diarrhea, change in sense of taste and smell.  PH[facility]9: [facility] 02/07/2019 01/12/2019 09/22/2018 05/06/2018 12/16/2017  Decreased Interest 0 0 0 0 0  Down, Depressed, Hopeless 0 0 0 0 0  PHQ - 2 Score 0 0 0 0 0  Altered sleeping 0 3 - 0 -  Tired, decreased energy 0 2 - 0 -  Change in appetite 0 0 - 0 -  Feeling bad or failure about yourself  0 0 - 0 -  Trouble concentrating 0 1 - 0 -  Moving slowly or fidgety/restless 0 0 - 0 -  Suicidal thoughts 0 0 - 0 -  PHQ-9 Score 0 6 - 0 -  Difficult doing work/chores Not difficult at all Somewhat difficult - Not difficult at all -     PHQ reviewed. Negative  Patient Active Problem List   Diagnosis Date Noted  . Asthma, intermittent with acute exacerbation 07/19/2018  . 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 Post-traumatic  osteoarthritis of right foot 09/08/2016  . Endometriosis of pelvis 01/05/2013    Past Medical History:  Diagnosis Date  . Allergy   . Arthritis   . Asthma   . Constipation   . Endometriosis    Uses Nuvaring continuously  . Heart murmur   . Ulcer     Past Surgical History:  Procedure Laterality Date  . ANKLE SURGERY    . ANKLE SURGERY Right 06/11/2017  . APPENDECTOMY  2008  . CESAREAN SECTION  1995  . CHOLECYSTECTOMY  2000  . LAPAROSCOPIC ENDOMETRIOSIS FULGURATION    . LAPAROSCOPY     for endometriosis  . WISDOM TOOTH EXTRACTION     x2    Social History   Tobacco Use  . Smoking status: Never Smoker  . Smokeless tobacco: Never Used  Substance Use Topics  . Alcohol use: No    Alcohol/week: 0.0 standard drinks     Current Outpatient Medications:  .  albuterol (PROVENTIL HFA;VENTOLIN HFA) 108 (90 Base) MCG/ACT inhaler, Inhale 2 puffs into the lungs every 6 (six) hours as needed for wheezing or shortness of breath., Disp: 1 Inhaler, Rfl: 0 .  fluticasone (FLONASE) 50 MCG/ACT nasal spray, Place 2 sprays into both nostrils daily., Disp: 16 g, Rfl: 6 .  fluticasone furoate-vilanterol (BREO ELLIPTA) 100-25 MCG/INH AEPB, Inhale 1 puff into the lungs daily., Disp: , Rfl:  .  ibuprofen (ADVIL,MOTRIN) 800 MG tablet, Take 1 tablet (800 mg total) by  mouth every 8 (eight) hours as needed., Disp: 60 tablet, Rfl: 1 .  levocetirizine (XYZAL) 5 MG tablet, Take 1 tablet (5 mg total) by mouth daily as needed for allergies. (Patient taking differently: Take 5 mg by mouth at bedtime. ), Disp: 30 tablet, Rfl: 11 .  montelukast (SINGULAIR) 10 MG tablet, Take 1 tablet (10 mg total) by mouth daily., Disp: 90 tablet, Rfl: 0 .  NUVARING 0.12-0.015 MG/24HR vaginal ring, Place 1 each vaginally every 28 (twenty-eight) days. Use in continuous fashion for endometriosis, Disp: 3 each, Rfl: 6 .  pantoprazole (PROTONIX) 40 MG tablet, Take 1 tablet (40 mg total) by mouth daily., Disp: 30 tablet, Rfl: 2 .   triamcinolone cream (KENALOG) 0.1 %, Apply to itchy bump bid as needed, Disp: , Rfl:  .  azithromycin (ZITHROMAX) 250 MG tablet, Take 2 first day and after that one daily (Patient not taking: Reported on 02/07/2019), Disp: 6 tablet, Rfl: 0 .  Spacer/Aero-Holding Chambers DEVI, 1 each by Does not apply route daily., Disp: 1 each, Rfl: 0  Allergies  Allergen Reactions  . Symbicort [Budesonide-Formoterol Fumarate] Itching  . Mosquito (Diagnostic) Swelling    ROS   No other specific complaints in a complete review of systems (except as listed in HPI above).  Objective  Vitals:   02/07/19 1319  Temp: 98.1 F (36.7 C)     There is no height or weight on file to calculate BMI.  Nursing Note and Vital Signs reviewed.  Physical Exam Constitutional: Patient appears well-developed and well-nourished. No distress.  HENT: Head: Normocephalic and atraumatic. Pulmonary/Chest: Effort normal  Musculoskeletal: Normal range of motion,  Neurological: alert and oriented, speech normal.  Skin: No rash noted. No erythema.  Psychiatric: Patient has a normal mood and affect. behavior is normal. Judgment and thought content normal.    Assessment & Plan  1. Acute non-recurrent pansinusitis - amoxicillin-clavulanate (AUGMENTIN) 875-125 MG tablet; Take 1 tablet by mouth 2 (two) times daily.  Dispense: 20 tablet; Refill: 0 - dextromethorphan-guaiFENesin (MUCINEX DM) 30-600 MG 12hr tablet; Take 1 tablet by mouth 2 (two) times daily.  Dispense: 30 tablet; Refill: 0  2. Antibiotic-induced yeast infection - fluconazole (DIFLUCAN) 150 MG tablet; Take 1 tablet (150 mg total) by mouth once for 1 dose.  Dispense: 1 tablet; Refill: 1   due to pandemic recommend self-isolation until next Sunday if she has been fever free without antipyretic for three days and has improvement of symptoms  Follow Up Instructions:    I discussed the assessment and treatment plan with the patient. The patient was provided an  opportunity to ask questions and all were answered. The patient agreed with the plan and demonstrated an understanding of the instructions.   The patient was advised to call back or seek an in-person evaluation if the symptoms worsen or if the condition fails to improve as anticipated.  I provided 14 minutes of non-face-to-face time during this encounter.   Fredderick Severance, NP

## 2019-02-08 ENCOUNTER — Telehealth: Payer: Self-pay

## 2019-02-08 NOTE — Telephone Encounter (Signed)
Copied from [place] [phone]. Topic: General - Inquiry >> Feb 08, 2019  9:40 AM Virl Axe D wrote: Reason for CRM: Pt stated that the work note she was given had the wrong return date. It read 01/14/19 and should ready 02/13/19. Requesting corrected note to be resent to [place]. Pt saw Benjamine Mola. Please advise.

## 2019-02-09 ENCOUNTER — Other Ambulatory Visit: Payer: Self-pay

## 2019-02-09 NOTE — Telephone Encounter (Signed)
Pt called requesting update on this. Please advise.  °

## 2019-02-17 ENCOUNTER — Encounter: Payer: Self-pay | Admitting: Family Medicine

## 2019-02-28 ENCOUNTER — Encounter: Payer: Self-pay | Admitting: Family Medicine

## 2019-02-28 ENCOUNTER — Ambulatory Visit (INDEPENDENT_AMBULATORY_CARE_PROVIDER_SITE_OTHER): Payer: Managed Care, Other (non HMO) | Admitting: Family Medicine

## 2019-02-28 ENCOUNTER — Other Ambulatory Visit: Payer: Self-pay

## 2019-02-28 DIAGNOSIS — J4521 Mild intermittent asthma with (acute) exacerbation: Secondary | ICD-10-CM | POA: Diagnosis not present

## 2019-02-28 DIAGNOSIS — J3089 Other allergic rhinitis: Secondary | ICD-10-CM

## 2019-02-28 MED ORDER — OLOPATADINE HCL 0.2 % OP SOLN: 1.0000 [drp] | Freq: Every day | OPHTHALMIC | 1 refills | Status: DC

## 2019-02-28 MED ORDER — LORATADINE 10 MG PO TABS: 10.0000 mg | ORAL_TABLET | ORAL | 1 refills | Status: DC

## 2019-02-28 MED ORDER — AZELASTINE HCL 0.15 % NA SOLN: 1.0000 | Freq: Every day | NASAL | 2 refills | Status: DC

## 2019-02-28 NOTE — Progress Notes (Signed)
Name: [name] [name]   MRN: [id number]    DOB: 1970/08/12   Date:02/28/2019       Progress Note  Subjective  Chief Complaint  Chief Complaint  Patient presents with  . Allergic Rhinitis     runny nose, stuffy,     I connected with  [name] [name]  on 02/28/19 at  9:00 AM EDT by a video enabled telemedicine application and verified that I am speaking with the correct person using two identifiers.  I discussed the limitations of evaluation and management by telemedicine and the availability of in person appointments. The patient expressed understanding and agreed to proceed. Staff also discussed with the patient that there may be a patient responsible charge related to this service. Patient Location: Home Provider Location: Office Additional Individuals present: None  HPI  Pt presents with concern for allergies.  She was treated for sinusitis and otitis media on 02/07/2019 by Lanora ManisElizabeth NP-C with Augmentin.  She notes sinus pain and ear pain have resolved, but she is still very congested, puffy eyes, eyes feel a bit irritated "like something is in them".  Does have occasional shortness of breath - has asthma and feels this is related - she uses her albuterol and this helps her symptoms - needing a few times a week. No eye watering, no cough, chest pain, no fevers/chills.  She is taking Xyzal, Flonase, singulair, and Breo.  She has seen allergist in the past and was told she had several environmental allergies.  She does not have an air filter in the home. She is a non-smoker, she does not have any pets; she has removed plants from her home.   Patient Active Problem List   Diagnosis Date Noted  . Asthma, intermittent with acute exacerbation 07/19/2018  . 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 Post-traumatic osteoarthritis of right foot 09/08/2016  . Endometriosis of pelvis 01/05/2013    Social History   Tobacco Use  . Smoking status: Never Smoker  .  Smokeless tobacco: Never Used  Substance Use Topics  . Alcohol use: No    Alcohol/week: 0.0 standard drinks     Current Outpatient Medications:  .  albuterol (PROVENTIL HFA;VENTOLIN HFA) 108 (90 Base) MCG/ACT inhaler, Inhale 2 puffs into the lungs every 6 (six) hours as needed for wheezing or shortness of breath., Disp: 1 Inhaler, Rfl: 0 .  fluticasone (FLONASE) 50 MCG/ACT nasal spray, Place 2 sprays into both nostrils daily., Disp: 16 g, Rfl: 6 .  fluticasone furoate-vilanterol (BREO ELLIPTA) 100-25 MCG/INH AEPB, Inhale 1 puff into the lungs daily., Disp: , Rfl:  .  ibuprofen (ADVIL,MOTRIN) 800 MG tablet, Take 1 tablet (800 mg total) by mouth every 8 (eight) hours as needed., Disp: 60 tablet, Rfl: 1 .  levocetirizine (XYZAL) 5 MG tablet, Take 1 tablet (5 mg total) by mouth daily as needed for allergies. (Patient taking differently: Take 5 mg by mouth at bedtime. ), Disp: 30 tablet, Rfl: 11 .  montelukast (SINGULAIR) 10 MG tablet, Take 1 tablet (10 mg total) by mouth daily., Disp: 90 tablet, Rfl: 0 .  NUVARING 0.12-0.015 MG/24HR vaginal ring, Place 1 each vaginally every 28 (twenty-eight) days. Use in continuous fashion for endometriosis, Disp: 3 each, Rfl: 6 .  pantoprazole (PROTONIX) 40 MG tablet, Take 1 tablet (40 mg total) by mouth daily., Disp: 30 tablet, Rfl: 2 .  Spacer/Aero-Holding Chambers DEVI, 1 each by Does not apply route daily., Disp: 1 each, Rfl: 0 .  amoxicillin-clavulanate (AUGMENTIN) 875-125 MG tablet, Take 1 tablet by mouth 2 (two) times daily. (Patient not taking: Reported on 02/28/2019), Disp: 20 tablet, Rfl: 0 .  dextromethorphan-guaiFENesin (MUCINEX DM) 30-600 MG 12hr tablet, Take 1 tablet by mouth 2 (two) times daily. (Patient not taking: Reported on 02/28/2019), Disp: 30 tablet, Rfl: 0 .  triamcinolone cream (KENALOG) 0.1 %, Apply to itchy bump bid as needed, Disp: , Rfl:   Allergies  Allergen Reactions  . Symbicort [Budesonide-Formoterol Fumarate] Itching  . Mosquito  (Diagnostic) Swelling    I personally reviewed active problem list, medication list, allergies, notes from last encounter, lab results with the patient/caregiver today.  ROS  Ten systems reviewed and is negative except as mentioned in HPI  Objective  Virtual encounter, vitals not obtained.  There is no height or weight on file to calculate BMI.  Nursing Note and Vital Signs reviewed.  Physical Exam  Constitutional: Patient appears well-developed and well-nourished. No distress.  HENT: Head: Normocephalic and atraumatic. Bilateral eyes show some mild puffiness in the bilateral lower lids, no erythema, no maxillary or frontal sinus pain per patient's self-palpation. She also denies any neck tenderness, lumps, or masses.  Neck: Normal range of motion. Pulmonary/Chest: Effort normal. No respiratory distress. Speaking in complete sentences Neurological: Pt is alert and oriented to person, place, and time. Coordination, speech and gait are normal.  Psychiatric: Patient has a normal mood and affect. behavior is normal. Judgment and thought content normal.  No results found for this or any previous visit (from the past 72 hour(s)).  Assessment & Plan  1. Mild intermittent asthma with acute exacerbation - Ambulatory referral to Allergy - Olopatadine HCl (PATADAY) 0.2 % SOLN; Apply 1 drop to eye daily.  Dispense: 2.5 mL; Refill: 1 - Azelastine HCl 0.15 % SOLN; Place 1 spray into the nose daily.  Dispense: 30 mL; Refill: 2  2. Non-seasonal allergic rhinitis, unspecified trigger - Ambulatory referral to Allergy - Olopatadine HCl (PATADAY) 0.2 % SOLN; Apply 1 drop to eye daily.  Dispense: 2.5 mL; Refill: 1 - Azelastine HCl 0.15 % SOLN; Place 1 spray into the nose daily.  Dispense: 30 mL; Refill: 2  - She works part-time job at this time at [employer] where [employer] are required.  She is having shortness of breath without a mask, and the mask seems to make things worse; she feels that she could  tolerate a shorter shift of 4-5 hours.  We will provide a note stating we recommend shorter shifts with mask wearing until cleared by allergist.  -Red flags and when to present for emergency care or RTC including fever >101.52F, chest pain, shortness of breath, new/worsening/un-resolving symptoms, reviewed with patient at time of visit. Follow up and care instructions discussed and provided in AVS. - I discussed the assessment and treatment plan with the patient. The patient was provided an opportunity to ask questions and all were answered. The patient agreed with the plan and demonstrated an understanding of the instructions.  I provided 16 minutes of non-face-to-face time during this encounter.  Hubbard Hartshorn, FNP

## 2019-03-01 ENCOUNTER — Other Ambulatory Visit: Payer: Self-pay | Admitting: Family Medicine

## 2019-03-01 ENCOUNTER — Telehealth: Payer: Self-pay | Admitting: Family Medicine

## 2019-03-01 DIAGNOSIS — J3089 Other allergic rhinitis: Secondary | ICD-10-CM

## 2019-03-01 DIAGNOSIS — J4521 Mild intermittent asthma with (acute) exacerbation: Secondary | ICD-10-CM

## 2019-03-01 MED ORDER — OLOPATADINE HCL 0.2 % OP SOLN: 1.0000 [drp] | Freq: Every day | OPHTHALMIC | 1 refills | Status: DC

## 2019-03-01 NOTE — Telephone Encounter (Signed)
Pt states the  Olopatadine HCl (PATADAY) 0.2 % SOLN  Is not covered by her insurance. Requesting another eye drop called into   [facility] ([facility] [place] [facility] [place] [phone] (Phone) [phone] (Fax)   Advised pt she may need to call her insurance company to see what is covered.

## 2019-03-03 ENCOUNTER — Other Ambulatory Visit: Payer: Self-pay

## 2019-03-03 ENCOUNTER — Ambulatory Visit (INDEPENDENT_AMBULATORY_CARE_PROVIDER_SITE_OTHER): Payer: Managed Care, Other (non HMO) | Admitting: Obstetrics & Gynecology

## 2019-03-03 ENCOUNTER — Encounter: Payer: Self-pay | Admitting: Obstetrics & Gynecology

## 2019-03-03 VITALS — BP 119/82 | HR 72 | Wt 155.6 lb

## 2019-03-03 DIAGNOSIS — Z124 Encounter for screening for malignant neoplasm of cervix: Secondary | ICD-10-CM

## 2019-03-03 DIAGNOSIS — Z1231 Encounter for screening mammogram for malignant neoplasm of breast: Secondary | ICD-10-CM

## 2019-03-03 DIAGNOSIS — Z01419 Encounter for gynecological examination (general) (routine) without abnormal findings: Secondary | ICD-10-CM | POA: Diagnosis not present

## 2019-03-03 DIAGNOSIS — Z1151 Encounter for screening for human papillomavirus (HPV): Secondary | ICD-10-CM

## 2019-03-03 DIAGNOSIS — Z113 Encounter for screening for infections with a predominantly sexual mode of transmission: Secondary | ICD-10-CM | POA: Diagnosis not present

## 2019-03-03 DIAGNOSIS — R87810 Cervical high risk human papillomavirus (HPV) DNA test positive: Secondary | ICD-10-CM

## 2019-03-03 NOTE — Progress Notes (Addendum)
GYNECOLOGY ANNUAL PREVENTATIVE CARE ENCOUNTER NOTE  History:     [name] [name] is a 48 y.o. G1P1 female here for a routine annual gynecologic exam.  Current complaints: bilateral breast tenderness, occasional hot flashes, night sweats. Thinks she could be perimenopausal.   Denies abnormal vaginal bleeding, discharge, pelvic pain, problems with intercourse or other gynecologic concerns.  Unfortunately, patient was sick with presumed COVID earlier this year; also lost her sister (possible COVID) and mother (blood clot after knee surgery). Support given to her, condolences extended to her family.    Gynecologic History No LMP recorded. (Menstrual status: Other). Contraception: none Last Pap: 02/25/2018. Results were: normal with positive HPV 18/45, benign colposcopy Last mammogram: 09/18/2016. Results were: normal  Obstetric History OB History  Gravida Para Term Preterm AB Living  1 1       1   SAB TAB Ectopic Multiple Live Births          1    # Outcome Date GA Lbr Len/2nd Weight Sex Delivery Anes PTL Lv  1 Para 1995    M CS-LTranv   LIV    Past Medical History:  Diagnosis Date  . Allergy   . Arthritis   . Asthma   . Constipation   . Endometriosis    Uses Nuvaring continuously  . Heart murmur   . Ulcer     Past Surgical History:  Procedure Laterality Date  . ANKLE SURGERY    . ANKLE SURGERY Right 06/11/2017  . APPENDECTOMY  2008  . CESAREAN SECTION  1995  . CHOLECYSTECTOMY  2000  . LAPAROSCOPIC ENDOMETRIOSIS FULGURATION    . LAPAROSCOPY     for endometriosis  . WISDOM TOOTH EXTRACTION     x2    Current Outpatient Medications on File Prior to Visit  Medication Sig Dispense Refill  . albuterol (PROVENTIL HFA;VENTOLIN HFA) 108 (90 Base) MCG/ACT inhaler Inhale 2 puffs into the lungs every 6 (six) hours as needed for wheezing or shortness of breath. 1 Inhaler 0  . Azelastine HCl 0.15 % SOLN Place 1 spray into the nose daily. 30 mL 2  . fluticasone (FLONASE) 50 MCG/ACT  nasal spray Place 2 sprays into both nostrils daily. 16 g 6  . fluticasone furoate-vilanterol (BREO ELLIPTA) 100-25 MCG/INH AEPB Inhale 1 puff into the lungs daily.    Marland Kitchen levocetirizine (XYZAL) 5 MG tablet Take 1 tablet (5 mg total) by mouth daily as needed for allergies. (Patient taking differently: Take 5 mg by mouth at bedtime. ) 30 tablet 11  . montelukast (SINGULAIR) 10 MG tablet Take 1 tablet (10 mg total) by mouth daily. 90 tablet 0  . NUVARING 0.12-0.015 MG/24HR vaginal ring Place 1 each vaginally every 28 (twenty-eight) days. Use in continuous fashion for endometriosis 3 each 6  . Olopatadine HCl (PATADAY) 0.2 % SOLN Apply 1 drop to eye daily. 2.5 mL 1  . pantoprazole (PROTONIX) 40 MG tablet Take 1 tablet (40 mg total) by mouth daily. 30 tablet 2  . Spacer/Aero-Holding Chambers DEVI 1 each by Does not apply route daily. 1 each 0  . triamcinolone cream (KENALOG) 0.1 % Apply to itchy bump bid as needed    . ibuprofen (ADVIL,MOTRIN) 800 MG tablet Take 1 tablet (800 mg total) by mouth every 8 (eight) hours as needed. (Patient not taking: Reported on 03/03/2019) 60 tablet 1  . loratadine (CLARITIN) 10 MG tablet Take 1 tablet (10 mg total) by mouth every morning. (Patient not taking: Reported on 03/03/2019) 90  tablet 1   No current facility-administered medications on file prior to visit.     Allergies  Allergen Reactions  . Symbicort [Budesonide-Formoterol Fumarate] Itching  . Mosquito (Diagnostic) Swelling    Social History:  reports that she has never smoked. She has never used smokeless tobacco. She reports that she does not drink alcohol or use drugs.  Family History  Problem Relation Age of Onset  . COPD Mother   . Emphysema Mother   . Cancer Father        unknown  . Cancer Paternal Aunt        ovarian    The following portions of the patient's history were reviewed and updated as appropriate: allergies, current medications, past family history, past medical history, past  social history, past surgical history and problem list.  Review of Systems Pertinent items noted in HPI and remainder of comprehensive ROS otherwise negative.  Physical Exam:  BP 119/82   Pulse 72   Wt 155 lb 9.6 oz (70.6 kg)   BMI 27.56 kg/m  CONSTITUTIONAL: Well-developed, well-nourished female in no acute distress.  HENT:  Normocephalic, atraumatic, External right and left ear normal. Oropharynx is clear and moist EYES: Conjunctivae and EOM are normal. Pupils are equal, round, and reactive to light. No scleral icterus.  NECK: Normal range of motion, supple, no masses.  Normal thyroid.  SKIN: Skin is warm and dry. No rash noted. Not diaphoretic. No erythema. No pallor. MUSCULOSKELETAL: Normal range of motion. No tenderness.  No cyanosis, clubbing, or edema.  2+ distal pulses. NEUROLOGIC: Alert and oriented to person, place, and time. Normal reflexes, muscle tone coordination. No cranial nerve deficit noted. PSYCHIATRIC: Normal mood and affect. Normal behavior. Normal judgment and thought content. CARDIOVASCULAR: Normal heart rate noted, regular rhythm RESPIRATORY: Clear to auscultation bilaterally. Effort and breath sounds normal, no problems with respiration noted. BREASTS: Symmetric in size. No masses, skin changes, nipple drainage, or lymphadenopathy. Mild tenderness to palpation bilaterally. ABDOMEN: Soft, normal bowel sounds, no distention noted.  No tenderness, rebound or guarding.  PELVIC: Normal appearing external genitalia; normal appearing vaginal mucosa and cervix.  No abnormal discharge noted.  Pap smear obtained.  Normal uterine size, no other palpable masses, no uterine or adnexal tenderness.   Assessment and Plan:    1. Breast cancer screening by mammogram Mammogram scheduled. No concerning examination findings other than mild tenderness bilaterally. - MM 3D SCREEN BREAST BILATERAL; Future  2. Cervical high risk HPV (human papillomavirus) test positive in 2019 History  of positive HPV 18/45 in 2019, benign colposcopy. Repeat pap needed this year and next year. - Cytology - PAP  3. Well woman exam with routine gynecological exam - Cytology - PAP (with ancillary STI testing) - CBC - TSH - Hemoglobin A1c - Lipid panel - Comprehensive metabolic panel - Hepatitis B surface antigen - Hepatitis C antibody - HIV Antibody (routine testing w rflx) - RPR - VITAMIN D 25 Hydroxy (Vit-D Deficiency, Fractures) Will follow up results of pap smear and preventative health maintainance labs and manage accordingly. Patient could be perimenopausal, talked about lifestyle interventions to help with vasomotor symptoms. Will consider further intervention later if necessary. Routine preventative health maintenance measures emphasized. Please refer to After Visit Summary for other counseling recommendations.      Jaynie CollinsUGONNA  Rochanda Harpham, MD, FACOG Obstetrician & Gynecologist, [facility], [facility]

## 2019-03-03 NOTE — Patient Instructions (Signed)
Perimenopause  Perimenopause is the normal time of life before and after menstrual periods stop completely (menopause). Perimenopause can begin 2-8 years before menopause, and it usually lasts for 1 year after menopause. During perimenopause, the ovaries may or may not produce an egg. What are the causes? This condition is caused by a natural change in hormone levels that happens as you get older. What increases the risk? This condition is more likely to start at an earlier age if you have certain medical conditions or treatments, including:  A tumor of the pituitary gland in the brain.  A disease that affects the ovaries and hormone production.  Radiation treatment for cancer.  Certain cancer treatments, such as chemotherapy or hormone (anti-estrogen) therapy.  Heavy smoking and excessive alcohol use.  Family history of early menopause. What are the signs or symptoms? Perimenopausal changes affect each woman differently. Symptoms of this condition may include:  Hot flashes.  Night sweats.  Irregular menstrual periods.  Decreased sex drive.  Vaginal dryness.  Headaches.  Mood swings.  Depression.  Memory problems or trouble concentrating.  Irritability.  Tiredness.  Weight gain.  Anxiety.  Trouble getting pregnant._0  Breast tenderness. How is this diagnosed? This condition is diagnosed based on your medical history, a physical exam, your age, your menstrual history, and your symptoms. Hormone tests may also be done. How is this treated? In some cases, no treatment is needed. You and your health care provider should make a decision together about whether treatment is necessary. Treatment will be based on your individual condition and preferences. Various treatments are available, such as:  Menopausal hormone therapy (MHT).  Medicines to treat specific symptoms.  Acupuncture.  Vitamin or herbal supplements. Before starting treatment, make sure to let your  health care provider know if you have a personal or family history of:  Heart disease.  Breast cancer.  Blood clots.  Diabetes.  Osteoporosis. Follow these instructions at home: Lifestyle  Do not use any products that contain nicotine or tobacco, such as cigarettes and e-cigarettes. If you need help quitting, ask your health care provider.  Eat a balanced diet that includes fresh fruits and vegetables, whole grains, soybeans, eggs, lean meat, and low-fat dairy.  Get at least 30 minutes of physical activity on 5 or more days each week.  Avoid alcoholic and caffeinated beverages, as well as spicy foods. This may help prevent hot flashes.  Get 7-8 hours of sleep each night.  Dress in layers that can be removed to help you manage hot flashes.  Find ways to manage stress, such as deep breathing, meditation, or journaling. General instructions  Keep track of your menstrual periods, including: ? When they occur. ? How heavy they are and how long they last. ? How much time passes between periods.  Keep track of your symptoms, noting when they start, how often you have them, and how long they last.  Take over-the-counter and prescription medicines only as told by your health care provider.  Take vitamin supplements only as told by your health care provider. These may include calcium, vitamin E, and vitamin D.  Use vaginal lubricants or moisturizers to help with vaginal dryness and improve comfort during sex.  Talk with your health care provider before starting any herbal supplements.  Keep all follow-up visits as told by your health care provider. This is important. This includes any group therapy or counseling. Contact a health care provider if:  You have heavy vaginal bleeding or pass blood clots.  Your period lasts more than 2 days longer than normal.  Your periods are recurring sooner than 21 days.  You bleed after having sex. Get help right away if:  You have chest  pain, trouble breathing, or trouble talking.  You have severe depression.  You have pain when you urinate.  You have severe headaches.  You have vision problems. Summary  Perimenopause is the time when a woman's body begins to move into menopause. This may happen naturally or as a result of other health problems or medical treatments.  Perimenopause can begin 2-8 years before menopause, and it usually lasts for 1 year after menopause.  Perimenopausal symptoms can be managed through medicines, lifestyle changes, and complementary therapies such as acupuncture. This information is not intended to replace advice given to you by your health care provider. Make sure you discuss any questions you have with your health care provider. Document Released: 09/04/2004 Document Revised: 07/10/2017 Document Reviewed: 09/02/2016 Elsevier Patient Education  2020 Else[employer] Inc[employer]  Preventive Care 67-58 Years Old, Female Preventive care refers to visits with your health care provider and lifestyle choices that can promote health and wellness. This includes:  A yearly physical exam. This may also be called an annual well check.  Regular dental visits and eye exams.  Immunizations.  Screening for certain conditions.  Healthy lifestyle choices, such as eating a healthy diet, getting regular exercise, not using drugs or products that contain nicotine and tobacco, and limiting alcohol use. What can I expect for my preventive care visit? Physical exam Your health care provider will check your:  Height and weight. This may be used to calculate body mass index (BMI), which tells if you are at a healthy weight.  Heart rate and blood pressure.  Skin for abnormal spots. Counseling Your health care provider may ask you questions about your:  Alcohol, tobacco, and drug use.  Emotional well-being.  Home and relationship well-being.  Sexual activity.  Eating habits.  Work and work Statistician.   Method of birth control.  Menstrual cycle.  Pregnancy history. What immunizations do I need?  Influenza (flu) vaccine  This is recommended every year. Tetanus, diphtheria, and pertussis (Tdap) vaccine  You may need a Td booster every 10 years. Varicella (chickenpox) vaccine  You may need this if you have not been vaccinated. Zoster (shingles) vaccine  You may need this after age 69. Measles, mumps, and rubella (MMR) vaccine  You may need at least one dose of MMR if you were born in 1957 or later. You may also need a second dose. Pneumococcal conjugate (PCV13) vaccine  You may need this if you have certain conditions and were not previously vaccinated. Pneumococcal polysaccharide (PPSV23) vaccine  You may need one or two doses if you smoke cigarettes or if you have certain conditions. Meningococcal conjugate (MenACWY) vaccine  You may need this if you have certain conditions. Hepatitis A vaccine  You may need this if you have certain conditions or if you travel or work in places where you may be exposed to hepatitis A. Hepatitis B vaccine  You may need this if you have certain conditions or if you travel or work in places where you may be exposed to hepatitis B. Haemophilus influenzae type b (Hib) vaccine  You may need this if you have certain conditions. Human papillomavirus (HPV) vaccine  If recommended by your health care provider, you may need three doses over 6 months. You may receive vaccines as individual doses or as more  than one vaccine together in one shot (combination vaccines). Talk with your health care provider about the risks and benefits of combination vaccines. What tests do I need? Blood tests  Lipid and cholesterol levels. These may be checked every 5 years, or more frequently if you are over 33 years old.  Hepatitis C test.  Hepatitis B test. Screening  Lung cancer screening. You may have this screening every year starting at age 4 if you have  a 30-pack-year history of smoking and currently smoke or have quit within the past 15 years.  Colorectal cancer screening. All adults should have this screening starting at age 61 and continuing until age 73. Your health care provider may recommend screening at age 72 if you are at increased risk. You will have tests every 1-10 years, depending on your results and the type of screening test.  Diabetes screening. This is done by checking your blood sugar (glucose) after you have not eaten for a while (fasting). You may have this done every 1-3 years.  Mammogram. This may be done every 1-2 years. Talk with your health care provider about when you should start having regular mammograms. This may depend on whether you have a family history of breast cancer.  BRCA-related cancer screening. This may be done if you have a family history of breast, ovarian, tubal, or peritoneal cancers.  Pelvic exam and Pap test. This may be done every 3 years starting at age 11. Starting at age 40, this may be done every 5 years if you have a Pap test in combination with an HPV test. Other tests  Sexually transmitted disease (STD) testing.  Bone density scan. This is done to screen for osteoporosis. You may have this scan if you are at high risk for osteoporosis. Follow these instructions at home: Eating and drinking  Eat a diet that includes fresh fruits and vegetables, whole grains, lean protein, and low-fat dairy.  Take vitamin and mineral supplements as recommended by your health care provider.  Do not drink alcohol if: ? Your health care provider tells you not to drink. ? You are pregnant, may be pregnant, or are planning to become pregnant.  If you drink alcohol: ? Limit how much you have to 0-1 drink a day. ? Be aware of how much alcohol is in your drink. In the [place] one drink equals one 12 oz bottle of beer (355 mL), one 5 oz glass of wine (148 mL), or one 1 oz glass of hard liquor (44 mL). Lifestyle   Take daily care of your teeth and gums.  Stay active. Exercise for at least 30 minutes on 5 or more days each week.  Do not use any products that contain nicotine or tobacco, such as cigarettes, e-cigarettes, and chewing tobacco. If you need help quitting, ask your health care provider.  If you are sexually active, practice safe sex. Use a condom or other form of birth control (contraception) in order to prevent pregnancy and STIs (sexually transmitted infections).  If told by your health care provider, take low-dose aspirin daily starting at age 82. What's next?  Visit your health care provider once a year for a well check visit.  Ask your health care provider how often you should have your eyes and teeth checked.  Stay up to date on all vaccines. This information is not intended to replace advice given to you by your health care provider. Make sure you discuss any questions you have with your health care  provider. Document Released: 08/24/2015 Document Revised: 04/08/2018 Document Reviewed: 04/08/2018 Elsevier Patient Education  2020 [employer].

## 2019-03-03 NOTE — Addendum Note (Signed)
Addended by: Verita Schneiders A on: 03/03/2019 11:58 AM   Modules accepted: Orders

## 2019-03-04 LAB — CBC
Hematocrit: 41.7 % (ref 34.0–46.6)
Hemoglobin: 14 g/dL (ref 11.1–15.9)
MCH: 28.5 pg (ref 26.6–33.0)
MCHC: 33.6 g/dL (ref 31.5–35.7)
MCV: 85 fL (ref 79–97)
Platelets: 314 10*3/uL (ref 150–450)
RBC: 4.92 x10E6/uL (ref 3.77–5.28)
RDW: 13.4 % (ref 11.7–15.4)
WBC: 6.2 10*3/uL (ref 3.4–10.8)

## 2019-03-04 LAB — COMPREHENSIVE METABOLIC PANEL
ALT: 10 IU/L (ref 0–32)
AST: 13 IU/L (ref 0–40)
Albumin/Globulin Ratio: 1.7 (ref 1.2–2.2)
Albumin: 4 g/dL (ref 3.8–4.8)
Alkaline Phosphatase: 79 IU/L (ref 39–117)
BUN/Creatinine Ratio: 21 (ref 9–23)
BUN: 13 mg/dL (ref 6–24)
Bilirubin Total: 0.3 mg/dL (ref 0.0–1.2)
CO2: 22 mmol/L (ref 20–29)
Calcium: 9 mg/dL (ref 8.7–10.2)
Chloride: 104 mmol/L (ref 96–106)
Creatinine, Ser: 0.61 mg/dL (ref 0.57–1.00)
GFR calc Af Amer: 124 mL/min/{1.73_m2} (ref >59)
GFR calc non Af Amer: 108 mL/min/{1.73_m2} (ref >59)
Globulin, Total: 2.3 g/dL (ref 1.5–4.5)
Glucose: 83 mg/dL (ref 65–99)
Potassium: 4.6 mmol/L (ref 3.5–5.2)
Sodium: 140 mmol/L (ref 134–144)
Total Protein: 6.3 g/dL (ref 6.0–8.5)

## 2019-03-04 LAB — TSH: TSH: 1.74 u[IU]/mL (ref 0.450–4.500)

## 2019-03-04 LAB — HEMOGLOBIN A1C
Est. average glucose Bld gHb Est-mCnc: 105 mg/dL
Hgb A1c MFr Bld: 5.3 % (ref 4.8–5.6)

## 2019-03-04 LAB — HIV ANTIBODY (ROUTINE TESTING W REFLEX): HIV Screen 4th Generation wRfx: NONREACTIVE

## 2019-03-04 LAB — HEPATITIS B SURFACE ANTIGEN: Hepatitis B Surface Ag: NEGATIVE

## 2019-03-04 LAB — LIPID PANEL
Chol/HDL Ratio: 2.6 ratio (ref 0.0–4.4)
Cholesterol, Total: 172 mg/dL (ref 100–199)
HDL: 66 mg/dL (ref >39)
LDL Calculated: 91 mg/dL (ref 0–99)
Triglycerides: 76 mg/dL (ref 0–149)
VLDL Cholesterol Cal: 15 mg/dL (ref 5–40)

## 2019-03-04 LAB — RPR: RPR Ser Ql: NONREACTIVE

## 2019-03-04 LAB — HEPATITIS C ANTIBODY: Hep C Virus Ab: 0.1 s/co ratio (ref 0.0–0.9)

## 2019-03-05 LAB — CYTOLOGY - PAP
Chlamydia: NEGATIVE
Diagnosis: NEGATIVE
HPV 16/18/45 genotyping: POSITIVE — AB
HPV: DETECTED — AB
Neisseria Gonorrhea: NEGATIVE
Trichomonas: NEGATIVE

## 2019-03-06 NOTE — Progress Notes (Signed)
This is the same result for pap smear every year since 2017; had benign adequate colposcopies x 3.  Consulted Dr. Everitt Amber (GYN Oncology), who advised that colposcopy this year was not needed, just continue yearly cotesting (pap and HPV tests).  If results continue to be the same, can do colposcopy every 3-5 years. This recommendation is based on the natural history of cervical cancer; it takes a long time to go from just HPV positivity to cancer. And the multiple benign colposcopies have ruled out false negative cytology results.  Patient can consider getting HPV vaccine to see if this will help clear the virus, but this will not be covered by insurance given her age. Patient was called and these recommendations were discussed with her over the phone, all questions answered.  Results were also released to MyChart and patient was given recommendations as indicated.   [name] Schneiders, MD

## 2019-03-09 LAB — SPECIMEN STATUS REPORT

## 2019-03-09 LAB — VITAMIN D 25 HYDROXY (VIT D DEFICIENCY, FRACTURES): Vit D, 25-Hydroxy: 67.9 ng/mL (ref 30.0–100.0)

## 2019-03-10 ENCOUNTER — Other Ambulatory Visit: Payer: Self-pay | Admitting: *Deleted

## 2019-03-10 ENCOUNTER — Other Ambulatory Visit: Payer: Self-pay | Admitting: Obstetrics & Gynecology

## 2019-03-10 DIAGNOSIS — N803 Endometriosis of pelvic peritoneum: Secondary | ICD-10-CM

## 2019-03-10 DIAGNOSIS — IMO0002 Reserved for concepts with insufficient information to code with codable children: Secondary | ICD-10-CM

## 2019-03-19 ENCOUNTER — Encounter: Payer: Self-pay | Admitting: Family Medicine

## 2019-03-19 ENCOUNTER — Other Ambulatory Visit: Payer: Self-pay | Admitting: Family Medicine

## 2019-03-21 ENCOUNTER — Other Ambulatory Visit: Payer: Self-pay | Admitting: Pulmonary Disease

## 2019-03-21 ENCOUNTER — Other Ambulatory Visit: Payer: Self-pay

## 2019-03-21 MED ORDER — BREO ELLIPTA 100-25 MCG/INH IN AEPB: INHALATION_SPRAY | RESPIRATORY_TRACT | 0 refills | Status: DC

## 2019-04-13 ENCOUNTER — Ambulatory Visit
Admission: RE | Admit: 2019-04-13 | Discharge: 2019-04-13 | Disposition: A | Discharge status: Home / Self Care | Payer: Managed Care, Other (non HMO) | Source: Ambulatory Visit | Attending: Obstetrics & Gynecology | Admitting: Obstetrics & Gynecology

## 2019-04-13 DIAGNOSIS — Z1231 Encounter for screening mammogram for malignant neoplasm of breast: Secondary | ICD-10-CM | POA: Diagnosis present

## 2019-04-19 ENCOUNTER — Encounter: Payer: Self-pay | Admitting: Family Medicine

## 2019-04-19 ENCOUNTER — Ambulatory Visit (INDEPENDENT_AMBULATORY_CARE_PROVIDER_SITE_OTHER): Payer: Managed Care, Other (non HMO) | Admitting: Family Medicine

## 2019-04-19 ENCOUNTER — Other Ambulatory Visit: Payer: Self-pay

## 2019-04-19 VITALS — BP 119/82 | Temp 97.8°F | Ht 63.0 in | Wt 155.0 lb

## 2019-04-19 DIAGNOSIS — Z889 Allergy status to unspecified drugs, medicaments and biological substances status: Secondary | ICD-10-CM

## 2019-04-19 DIAGNOSIS — L309 Dermatitis, unspecified: Secondary | ICD-10-CM

## 2019-04-19 DIAGNOSIS — J3089 Other allergic rhinitis: Secondary | ICD-10-CM

## 2019-04-19 DIAGNOSIS — J309 Allergic rhinitis, unspecified: Secondary | ICD-10-CM

## 2019-04-19 DIAGNOSIS — J302 Other seasonal allergic rhinitis: Secondary | ICD-10-CM

## 2019-04-19 DIAGNOSIS — M62838 Other muscle spasm: Secondary | ICD-10-CM

## 2019-04-19 DIAGNOSIS — J4520 Mild intermittent asthma, uncomplicated: Secondary | ICD-10-CM

## 2019-04-19 DIAGNOSIS — K219 Gastro-esophageal reflux disease without esophagitis: Secondary | ICD-10-CM

## 2019-04-19 MED ORDER — TRIAMCINOLONE ACETONIDE 0.1 % EX CREA: TOPICAL_CREAM | Freq: Two times a day (BID) | CUTANEOUS | 0 refills | Status: AC

## 2019-04-19 MED ORDER — BREO ELLIPTA 100-25 MCG/INH IN AEPB: INHALATION_SPRAY | RESPIRATORY_TRACT | 5 refills | Status: DC

## 2019-04-19 MED ORDER — AZELASTINE HCL 0.15 % NA SOLN: 1.0000 | Freq: Every day | NASAL | 1 refills | Status: AC

## 2019-04-19 MED ORDER — DESONIDE 0.05 % EX CREA: TOPICAL_CREAM | Freq: Two times a day (BID) | CUTANEOUS | 0 refills | Status: DC

## 2019-04-19 MED ORDER — ALBUTEROL SULFATE HFA 108 (90 BASE) MCG/ACT IN AERS: 2.0000 | INHALATION_SPRAY | Freq: Four times a day (QID) | RESPIRATORY_TRACT | 1 refills | Status: AC | PRN

## 2019-04-19 MED ORDER — FLUTICASONE PROPIONATE 50 MCG/ACT NA SUSP: 2.0000 | Freq: Every day | NASAL | 1 refills | Status: DC

## 2019-04-19 MED ORDER — CYCLOBENZAPRINE HCL 10 MG PO TABS: 10.0000 mg | ORAL_TABLET | Freq: Three times a day (TID) | ORAL | 0 refills | Status: DC | PRN

## 2019-04-19 MED ORDER — MONTELUKAST SODIUM 10 MG PO TABS: 10.0000 mg | ORAL_TABLET | Freq: Every day | ORAL | 1 refills | Status: DC

## 2019-04-19 MED ORDER — PANTOPRAZOLE SODIUM 40 MG PO TBEC: 40.0000 mg | DELAYED_RELEASE_TABLET | Freq: Every day | ORAL | 1 refills | Status: DC

## 2019-04-19 NOTE — Progress Notes (Signed)
Name: [name] SallesJessie S [name]   MRN: [id number]    DOB: May 15, 1971   Date:04/19/2019       Progress Note  Subjective  Chief Complaint  Chief Complaint  Patient presents with   Medication Refill   Asthma    Denies any symptoms   Back Pain    Lower back pain for the past 2 days, achy when she lays down for bed at night   Gastroesophageal Reflux    Flair up happened after eating pizza but symptoms went away.    Eczema    Is flairing up on her back and neck-would like medication. Dry patches on her face.     I connected with  [name] CirriJessie S [name]  on 04/19/19 at  2:40 PM EDT by a video enabled telemedicine application and verified that I am speaking with the correct person using two identifiers.  I discussed the limitations of evaluation and management by telemedicine and the availability of in person appointments. The patient expressed understanding and agreed to proceed. Staff also discussed with the patient that there may be a patient responsible charge related to this service. Patient Location: she is at home  [facility]   HPI  Asthma : she sates she usually has just two to 3 flares per year, usually triggered by weather changes, she had a severe episode in 08/2018 and had to be admitted - she had Atypical pneumonia since than she had one more flare. She is currently doing well, no cough, wheezing or SOB. She has been using Breo daily and uses Albuterol less than once a week  AR: she states having a little flare, stuffy nose, no rhinorrhea, no pruritis, she has been using Flonase , Astelin , also on oral medications. Singulair, claritin and xyzal  Muscle spasms: pain is intermittent, going on for years, this episode started 4 days ago, she has noticed that is uncomfortable when she goes to bed, pain is described as aching, lower back, no radiation to lower back. No symptoms of radiculitis or rashes. She is out of Flexeril   GERD: she states symptoms usually  well controlled, but ate pizza last week and had heartburn for two days, She was taking pantoprazole daily but ran out of medication and switched to Omeprazole otc last week.   Eczema: she has noticed a flare, dry spot on the upper back and also left side of neck, also has spots on her face , she used vaseline on her face this am.    Patient Active Problem List   Diagnosis Date Noted   Perennial allergic rhinitis with seasonal variation 04/19/2019   Asthma, mild intermittent 07/19/2018   Cervical high risk HPV (human papillomavirus) test positive 02/27/2017   Post-traumatic osteoarthritis of right foot 09/08/2016   Endometriosis of pelvis 01/05/2013    Past Surgical History:  Procedure Laterality Date   ANKLE SURGERY     ANKLE SURGERY Right 06/11/2017   APPENDECTOMY  2008   CESAREAN SECTION  1995   CHOLECYSTECTOMY  2000   LAPAROSCOPIC ENDOMETRIOSIS FULGURATION     LAPAROSCOPY     for endometriosis   WISDOM TOOTH EXTRACTION     x2    Family History  Problem Relation Age of Onset   COPD Mother    Emphysema Mother    Cancer Father        unknown   Cancer Paternal Aunt        ovarian    Social History  Socioeconomic History   Marital status: Single    Spouse name: Not on file   Number of children: 1   Years of education: Not on file   Highest education level: Bachelor's degree (e.g., BA, AB, BS)  Occupational History   Not on file  Social Needs   Financial resource strain: Not hard at all   Food insecurity    Worry: Never true    Inability: Never true   Transportation needs    Medical: No    Non-medical: No  Tobacco Use   Smoking status: Never Smoker   Smokeless tobacco: Never Used  Substance and Sexual Activity   Alcohol use: No    Alcohol/week: 0.0 standard drinks   Drug use: No   Sexual activity: Yes    Birth control/protection: Inserts    Comment: Nuva ring  Lifestyle   Physical activity    Days per week: 0 days     Minutes per session: 0 min   Stress: Only a little  Relationships   Social connections    Talks on phone: More than three times a week    Gets together: More than three times a week    Attends religious service: More than 4 times per year    Active member of club or organization: No    Attends meetings of clubs or organizations: Never    Relationship status: Never married   Intimate partner violence    Fear of current or ex partner: No    Emotionally abused: No    Physically abused: No    Forced sexual activity: No  Other Topics Concern   Not on file  Social History Narrative   Raised by her paternal aunt. She died in March 2020 from PE , complication of knee surgery      Current Outpatient Medications:    albuterol (VENTOLIN HFA) 108 (90 Base) MCG/ACT inhaler, Inhale 2 puffs into the lungs every 6 (six) hours as needed for wheezing or shortness of breath., Disp: 18 g, Rfl: 1   Azelastine HCl 0.15 % SOLN, Place 1 spray into the nose daily., Disp: 90 mL, Rfl: 1   fluticasone (FLONASE) 50 MCG/ACT nasal spray, Place 2 sprays into both nostrils daily., Disp: 48 g, Rfl: 1   fluticasone furoate-vilanterol (BREO ELLIPTA) 100-25 MCG/INH AEPB, Inhale 1 puff by mouth once daily, Disp: 60 each, Rfl: 5   ibuprofen (ADVIL,MOTRIN) 800 MG tablet, Take 1 tablet (800 mg total) by mouth every 8 (eight) hours as needed., Disp: 60 tablet, Rfl: 1   levocetirizine (XYZAL) 5 MG tablet, Take 1 tablet (5 mg total) by mouth daily as needed for allergies. (Patient taking differently: Take 5 mg by mouth at bedtime. ), Disp: 30 tablet, Rfl: 11   loratadine (CLARITIN) 10 MG tablet, Take 1 tablet (10 mg total) by mouth every morning., Disp: 90 tablet, Rfl: 1   montelukast (SINGULAIR) 10 MG tablet, Take 1 tablet (10 mg total) by mouth daily., Disp: 90 tablet, Rfl: 1   NUVARING 0.12-0.015 MG/24HR vaginal ring, INSERT ONE RING VAGINALLY EVERY 28 DAYS. USE IN CONTINUOUS FASHION FOR ENDOMETRIOSIS., Disp: 3  each, Rfl: 12   pantoprazole (PROTONIX) 40 MG tablet, Take 1 tablet (40 mg total) by mouth daily., Disp: 90 tablet, Rfl: 1   Spacer/Aero-Holding Chambers DEVI, 1 each by Does not apply route daily., Disp: 1 each, Rfl: 0   triamcinolone cream (KENALOG) 0.1 %, Apply topically 2 (two) times daily., Disp: 453.6 g, Rfl: 0   cyclobenzaprine (  FLEXERIL) 10 MG tablet, Take 1 tablet (10 mg total) by mouth 3 (three) times daily as needed for muscle spasms., Disp: 60 tablet, Rfl: 0   desonide (DESOWEN) 0.05 % cream, Apply topically 2 (two) times daily. On face, Disp: 30 g, Rfl: 0  Allergies  Allergen Reactions   Symbicort [Budesonide-Formoterol Fumarate] Itching   Mosquito (Diagnostic) Swelling    I personally reviewed active problem list, medication list, allergies, family history, social history with the patient/caregiver today.   ROS  Ten systems reviewed and is negative except as mentioned in HPI   Objective  Virtual encounter, vitals not obtained.  Body mass index is 27.46 kg/m.  Physical Exam  Awake, alert and oriented Eczematous patches on face   PHQ2/9: Depression screen [facility] 2/9 04/19/2019 02/28/2019 02/07/2019 01/12/2019 09/22/2018  Decreased Interest 0 0 0 0 0  Down, Depressed, Hopeless 0 0 0 0 0  PHQ - 2 Score 0 0 0 0 0  Altered sleeping 0 1 0 3 -  Tired, decreased energy 0 0 0 2 -  Change in appetite 0 0 0 0 -  Feeling bad or failure about yourself  0 0 0 0 -  Trouble concentrating 0 0 0 1 -  Moving slowly or fidgety/restless 0 0 0 0 -  Suicidal thoughts 0 0 0 0 -  PHQ-9 Score 0 1 0 6 -  Difficult doing work/chores Not difficult at all Not difficult at all Not difficult at all Somewhat difficult -   PHQ-2/9 Result is negative.     Fall Risk: Fall Risk  04/19/2019 02/28/2019 02/07/2019 09/22/2018 07/19/2018  Falls in the past year? 0 0 0 0 0  Number falls in past yr: 0 0 0 0 0  Injury with Fall? 0 0 0 0 -  Follow up - Falls evaluation completed - - -      Assessment & Plan  1. Gastroesophageal reflux disease without esophagitis  - pantoprazole (PROTONIX) 40 MG tablet; Take 1 tablet (40 mg total) by mouth daily.  Dispense: 90 tablet; Refill: 1  2. Muscle spasm  - cyclobenzaprine (FLEXERIL) 10 MG tablet; Take 1 tablet (10 mg total) by mouth 3 (three) times daily as needed for muscle spasms.  Dispense: 60 tablet; Refill: 0  3. Perennial allergic rhinitis with seasonal variation  - Azelastine HCl 0.15 % SOLN; Place 1 spray into the nose daily.  Dispense: 90 mL; Refill: 1  4. Multiple allergies  - montelukast (SINGULAIR) 10 MG tablet; Take 1 tablet (10 mg total) by mouth daily.  Dispense: 90 tablet; Refill: 1  5. Allergic rhinitis, unspecified seasonality, unspecified trigger  - fluticasone (FLONASE) 50 MCG/ACT nasal spray; Place 2 sprays into both nostrils daily.  Dispense: 48 g; Refill: 1  6. Mild intermittent asthma without complication  - fluticasone furoate-vilanterol (BREO ELLIPTA) 100-25 MCG/INH AEPB; Inhale 1 puff by mouth once daily  Dispense: 60 each; Refill: 5 - albuterol (VENTOLIN HFA) 108 (90 Base) MCG/ACT inhaler; Inhale 2 puffs into the lungs every 6 (six) hours as needed for wheezing or shortness of breath.  Dispense: 18 g; Refill: 1  7. Eczema, unspecified type  - triamcinolone cream (KENALOG) 0.1 %; Apply topically 2 (two) times daily.  Dispense: 453.6 g; Refill: 0 - desonide (DESOWEN) 0.05 % cream; Apply topically 2 (two) times daily. On face  Dispense: 30 g; Refill: 0  I discussed the assessment and treatment plan with the patient. The patient was provided an opportunity to ask questions and all were  answered. The patient agreed with the plan and demonstrated an understanding of the instructions.  The patient was advised to call back or seek an in-person evaluation if the symptoms worsen or if the condition fails to improve as anticipated.  I provided 25  minutes of non-face-to-face time during this  encounter.

## 2019-05-03 ENCOUNTER — Encounter: Payer: Self-pay | Admitting: Family Medicine

## 2019-05-04 ENCOUNTER — Other Ambulatory Visit: Payer: Self-pay | Admitting: *Deleted

## 2019-05-04 ENCOUNTER — Ambulatory Visit: Payer: Managed Care, Other (non HMO) | Admitting: Family Medicine

## 2019-05-04 DIAGNOSIS — Z20822 Contact with and (suspected) exposure to covid-19: Secondary | ICD-10-CM

## 2019-05-04 NOTE — Telephone Encounter (Signed)
Pt called in to schedule an appt. I asked the Covid questions and pt stated she had random chest pains, cough and congestion. Pt was advised to go to the ER while it was still early. Also, due to pt having cough and congestion according to [facility] policy she cannot come inside the office. Pt was frustrated and stated she was not going to the ER due to the cost. I apologized to the pt and the call ended.

## 2019-05-05 LAB — NOVEL CORONAVIRUS, NAA: SARS-CoV-2, NAA: NOT DETECTED

## 2019-05-06 ENCOUNTER — Other Ambulatory Visit: Payer: Self-pay | Admitting: Family Medicine

## 2019-05-06 DIAGNOSIS — L309 Dermatitis, unspecified: Secondary | ICD-10-CM

## 2019-05-25 ENCOUNTER — Telehealth: Payer: Self-pay

## 2019-05-25 NOTE — Telephone Encounter (Signed)
Copied from [place] [id number]. Topic: General - Other >> May 25, 2019 12:07 PM Mathis Bud wrote: Reason for CRM: DR.Bell from [facility], patient got a OCT and he would like to discuss this with PCP .  Call back [phone]

## 2019-05-26 ENCOUNTER — Telehealth: Payer: Self-pay | Admitting: Family Medicine

## 2019-05-26 ENCOUNTER — Encounter: Payer: Self-pay | Admitting: Family Medicine

## 2019-05-26 NOTE — Telephone Encounter (Signed)
Copied from [place] [id number]. Topic: General - Other >> May 26, 2019 12:33 PM Keene Breath wrote: Reason for CRM: Patient called to ask that the doctor call her regarding a referral.  CB# [phone]

## 2019-05-26 NOTE — Telephone Encounter (Signed)
Pt called and stated that she had a appointment with dr bell and he had suggested that pt may need to see a neurologist. Dr Gloriann Loan had said she may have cerebral hypertension. Please advise

## 2019-05-27 ENCOUNTER — Other Ambulatory Visit: Payer: Self-pay | Admitting: *Deleted

## 2019-05-27 DIAGNOSIS — IMO0002 Reserved for concepts with insufficient information to code with codable children: Secondary | ICD-10-CM

## 2019-05-27 DIAGNOSIS — R102 Pelvic and perineal pain: Secondary | ICD-10-CM

## 2019-05-27 DIAGNOSIS — N803 Endometriosis of pelvic peritoneum: Secondary | ICD-10-CM

## 2019-05-27 MED ORDER — IBUPROFEN 800 MG PO TABS: 800.0000 mg | ORAL_TABLET | Freq: Three times a day (TID) | ORAL | 1 refills | Status: AC | PRN

## 2019-05-27 NOTE — Telephone Encounter (Signed)
Patient informed to keep appt Tuesday and if she gets worst per Dr. Gloriann Loan to go to ER. Asked her to sign medical release so Dr. Ancil Boozer can have her notes and she will make referral during her appt.

## 2019-05-27 NOTE — Telephone Encounter (Signed)
Copied from [place] [id number]. Topic: General - Other >> May 26, 2019  5:00 PM Rainey Pines A wrote: Patient is requesting a callback in regards to a referral to neuro. Patient stated that Dr. Marlan Palau office has been waiting to hear from Dr .Ancil Boozer.

## 2019-05-31 ENCOUNTER — Ambulatory Visit: Payer: Managed Care, Other (non HMO) | Admitting: Family Medicine

## 2019-05-31 ENCOUNTER — Encounter: Payer: Self-pay | Admitting: Family Medicine

## 2019-05-31 ENCOUNTER — Other Ambulatory Visit: Payer: Self-pay

## 2019-05-31 ENCOUNTER — Telehealth: Payer: Self-pay

## 2019-05-31 ENCOUNTER — Ambulatory Visit
Admission: RE | Admit: 2019-05-31 | Discharge: 2019-05-31 | Disposition: A | Discharge status: Home / Self Care | Payer: Managed Care, Other (non HMO) | Source: Ambulatory Visit | Attending: Family Medicine | Admitting: Family Medicine

## 2019-05-31 VITALS — BP 122/74 | HR 89 | Temp 97.1°F | Resp 16 | Ht 63.0 in | Wt 158.9 lb

## 2019-05-31 DIAGNOSIS — R519 Headache, unspecified: Secondary | ICD-10-CM | POA: Diagnosis present

## 2019-05-31 DIAGNOSIS — H4710 Unspecified papilledema: Secondary | ICD-10-CM | POA: Diagnosis present

## 2019-05-31 DIAGNOSIS — Z23 Encounter for immunization: Secondary | ICD-10-CM

## 2019-05-31 MED ORDER — GADOBUTROL 1 MMOL/ML IV SOLN
7.0000 mL | Freq: Once | INTRAVENOUS | Status: AC | PRN
  Administered 2019-05-31: 7 mL via INTRAVENOUS

## 2019-05-31 NOTE — Progress Notes (Signed)
Name: [name] [name]   MRN: [id number]    DOB: [birth year]   Date:05/31/2019       Progress Note  Subjective  Chief Complaint  Chief Complaint  Patient presents with  . Referral    Dr. Alvester Morin performed a OCT, has headaches and he recommended evaluation with neurologist for possible pseudotumor cerebri. It is increase in size bilaterally  . Headache    HPI  Acute onset of headache: she states symptoms started about 2 weeks ago. Initially felt some nausea when looking down on her phone, it resolved but for a few days had recurrence of photophobia, and dizziness when looking into a screen. A few days later she noticed occipital headache, associated with vomiting, she took ibuprofen and went to bed. The following morning she woke up symptom free but two hours later symptoms returned and were associated with dizziness. She felt it could be secondary to eye problems. She tried to go to [facility] and they were booked. She saw Dr. Alvester Morin on October 13 th and had a ONH and RNFL analysis and found the optic disc to be slightly abnormal and may have increase pressure. We placed a referral to neurologist , appointment next week. She states she is avoiding screen time and seems to help with symptoms. She has been wearing a shades. She states dizziness still triggered by screen use. Occipital headache still present daily, worse when she lays flat and also with head movement.  No neuro deficit    Patient Active Problem List   Diagnosis Date Noted  . Perennial allergic rhinitis with seasonal variation 04/19/2019  . Asthma, mild intermittent 07/19/2018  . 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 Post-traumatic osteoarthritis of right foot 09/08/2016  . Endometriosis of pelvis 01/05/2013    Past Surgical History:  Procedure Laterality Date  . ANKLE SURGERY    . ANKLE SURGERY Right 06/11/2017  . APPENDECTOMY  2008  . CESAREAN SECTION  1995  . CHOLECYSTECTOMY  2000  .  LAPAROSCOPIC ENDOMETRIOSIS FULGURATION    . LAPAROSCOPY     for endometriosis  . WISDOM TOOTH EXTRACTION     x2    Family History  Problem Relation Age of Onset  . COPD Mother   . Emphysema Mother   . Cancer Father        unknown  . Cancer Paternal Aunt        ovarian    Social History   Socioeconomic History  . Marital status: Single    Spouse name: Not on file  . Number of children: 1  . Years of education: Not on file  . Highest education level: Bachelor's degree (e.g., BA, AB, BS)  Occupational History  . Not on file  Social Needs  . Financial resource strain: Not hard at all  . Food insecurity    Worry: Never true    Inability: Never true  . Transportation needs    Medical: No    Non-medical: No  Tobacco Use  . Smoking status: Never Smoker  . Smokeless tobacco: Never Used  Substance and Sexual Activity  . Alcohol use: No    Alcohol/week: 0.0 standard drinks  . Drug use: No  . Sexual activity: Yes    Birth control/protection: Inserts    Comment: Nuva ring  Lifestyle  . Physical activity    Days per week: 0 days    Minutes per session: 0 min  . Stress: Only a little  Relationships  .  Social connections    Talks on phone: More than three times a week    Gets together: More than three times a week    Attends religious service: More than 4 times per year    Active member of club or organization: No    Attends meetings of clubs or organizations: Never    Relationship status: Never married  . Intimate partner violence    Fear of current or ex partner: No    Emotionally abused: No    Physically abused: No    Forced sexual activity: No  Other Topics Concern  . Not on file  Social History Narrative   Raised by her paternal aunt. She died in March 7353 from PE , complication of knee surgery      Current Outpatient Medications:  .  albuterol (VENTOLIN HFA) 108 (90 Base) MCG/ACT inhaler, Inhale 2 puffs into the lungs every 6 (six) hours as needed for  wheezing or shortness of breath., Disp: 18 g, Rfl: 1 .  Azelastine HCl 0.15 % SOLN, Place 1 spray into the nose daily., Disp: 90 mL, Rfl: 1 .  cyclobenzaprine (FLEXERIL) 10 MG tablet, Take 1 tablet (10 mg total) by mouth 3 (three) times daily as needed for muscle spasms., Disp: 60 tablet, Rfl: 0 .  desonide (DESOWEN) 0.05 % cream, APPLY ON FACE TOPICALLY TWICE DAILY, Disp: 30 g, Rfl: 0 .  fluticasone (FLONASE) 50 MCG/ACT nasal spray, Place 2 sprays into both nostrils daily., Disp: 48 g, Rfl: 1 .  fluticasone furoate-vilanterol (BREO ELLIPTA) 100-25 MCG/INH AEPB, Inhale 1 puff by mouth once daily, Disp: 60 each, Rfl: 5 .  ibuprofen (ADVIL) 800 MG tablet, Take 1 tablet (800 mg total) by mouth every 8 (eight) hours as needed., Disp: 60 tablet, Rfl: 1 .  levocetirizine (XYZAL) 5 MG tablet, Take 1 tablet (5 mg total) by mouth daily as needed for allergies. (Patient taking differently: Take 5 mg by mouth at bedtime. ), Disp: 30 tablet, Rfl: 11 .  loratadine (CLARITIN) 10 MG tablet, Take 1 tablet (10 mg total) by mouth every morning., Disp: 90 tablet, Rfl: 1 .  montelukast (SINGULAIR) 10 MG tablet, Take 1 tablet (10 mg total) by mouth daily., Disp: 90 tablet, Rfl: 1 .  NUVARING 0.12-0.015 MG/24HR vaginal ring, INSERT ONE RING VAGINALLY EVERY 28 DAYS. USE IN CONTINUOUS FASHION FOR ENDOMETRIOSIS., Disp: 3 each, Rfl: 12 .  pantoprazole (PROTONIX) 40 MG tablet, Take 1 tablet by mouth once daily, Disp: 90 tablet, Rfl: 0 .  pantoprazole (PROTONIX) 40 MG tablet, Take 1 tablet (40 mg total) by mouth daily., Disp: 90 tablet, Rfl: 1 .  Spacer/Aero-Holding Chambers DEVI, 1 each by Does not apply route daily., Disp: 1 each, Rfl: 0 .  triamcinolone cream (KENALOG) 0.1 %, Apply topically 2 (two) times daily., Disp: 453.6 g, Rfl: 0  Allergies  Allergen Reactions  . Symbicort [Budesonide-Formoterol Fumarate] Itching  . Mosquito (Diagnostic) Swelling    I personally reviewed active problem list, medication list,  allergies, family history, social history, health maintenance with the patient/caregiver today.   ROS  Ten systems reviewed and is negative except as mentioned in HPI   Objective  Vitals:   05/31/19 1100  BP: 122/74  Pulse: 89  Resp: 16  Temp: (!) 97.1 F (36.2 C)  TempSrc: Temporal  SpO2: 98%  Weight: 158 lb 14.4 oz (72.1 kg)  Height: 5\' 3"  (1.6 m)    Body mass index is 28.15 kg/m.  Physical Exam  Constitutional: Patient  appears well-developed and well-nourished.  No distress.  HEENT: head atraumatic, normocephalic, pupils equal and reactive to light, neck slightly tender posteriorly  Cardiovascular: Normal rate, regular rhythm and normal heart sounds.  No murmur heard. No BLE edema. Pulmonary/Chest: Effort normal and breath sounds normal. No respiratory distress. Abdominal: Soft.  There is no tenderness. Neurological: normal cranial nerves, romberg negative, normal strength and gait, normal sensation  Psychiatric: Patient has a normal mood and affect. behavior is normal. Judgment and thought content normal.   Recent Results (from the past 2160 hour(s))  Cytology - PAP     Status: Abnormal   Collection Time: 03/03/19 12:00 AM  Result Value Ref Range   Adequacy      Satisfactory for evaluation  endocervical/transformation zone component PRESENT.   Diagnosis      NEGATIVE FOR INTRAEPITHELIAL LESIONS OR MALIGNANCY.   Chlamydia Negative     Comment: Normal Reference Range - Negative   Neisseria Gonorrhea Negative     Comment: Normal Reference Range - Negative   HPV DETECTED (A)     Comment: Normal Reference Range - NOT Detected   HPV 16/18/45 genotyping POSITIVE for HPV 18/45 (A)     Comment: Normal Reference Range - Negative   Trichomonas Negative     Comment: Normal Reference Range - Negative   Material Submitted CervicoVaginal Pap [ThinPrep Imaged]    CYTOLOGY - PAP PAP RESULT   CBC     Status: None   Collection Time: 03/03/19  9:42 AM  Result Value Ref Range    WBC 6.2 3.4 - 10.8 x10E3/uL   RBC 4.92 3.77 - 5.28 x10E6/uL   Hemoglobin 14.0 11.1 - 15.9 g/dL   Hematocrit 42.7 06.2 - 46.6 %   MCV 85 79 - 97 fL   MCH 28.5 26.6 - 33.0 pg   MCHC 33.6 31.5 - 35.7 g/dL   RDW 37.6 28.3 - 15.1 %   Platelets 314 150 - 450 x10E3/uL  TSH     Status: None   Collection Time: 03/03/19  9:42 AM  Result Value Ref Range   TSH 1.740 0.450 - 4.500 uIU/mL  Hemoglobin A1c     Status: None   Collection Time: 03/03/19  9:42 AM  Result Value Ref Range   Hgb A1c MFr Bld 5.3 4.8 - 5.6 %    Comment:          Prediabetes: 5.7 - 6.4          Diabetes: >6.4          Glycemic control for adults with diabetes: <7.0    Est. average glucose Bld gHb Est-mCnc 105 mg/dL  Lipid panel     Status: None   Collection Time: 03/03/19  9:42 AM  Result Value Ref Range   Cholesterol, Total 172 100 - 199 mg/dL   Triglycerides 76 0 - 149 mg/dL   HDL 66 >76 mg/dL   VLDL Cholesterol Cal 15 5 - 40 mg/dL   LDL Calculated 91 0 - 99 mg/dL   Chol/HDL Ratio 2.6 0.0 - 4.4 ratio    Comment:                                   T. Chol/HDL Ratio  Men  Women                               1/2 Avg.Risk  3.4    3.3                                   Avg.Risk  5.0    4.4                                2X Avg.Risk  9.6    7.1                                3X Avg.Risk 23.4   11.0   Comprehensive metabolic panel     Status: None   Collection Time: 03/03/19  9:42 AM  Result Value Ref Range   Glucose 83 65 - 99 mg/dL   BUN 13 6 - 24 mg/dL   Creatinine, Ser 8.29 0.57 - 1.00 mg/dL   GFR calc non Af Amer 108 >59 mL/min/1.73   GFR calc Af Amer 124 >59 mL/min/1.73   BUN/Creatinine Ratio 21 9 - 23   Sodium 140 134 - 144 mmol/L   Potassium 4.6 3.5 - 5.2 mmol/L   Chloride 104 96 - 106 mmol/L   CO2 22 20 - 29 mmol/L   Calcium 9.0 8.7 - 10.2 mg/dL   Total Protein 6.3 6.0 - 8.5 g/dL   Albumin 4.0 3.8 - 4.8 g/dL   Globulin, Total 2.3 1.5 - 4.5 g/dL    Albumin/Globulin Ratio 1.7 1.2 - 2.2   Bilirubin Total 0.3 0.0 - 1.2 mg/dL   Alkaline Phosphatase 79 39 - 117 IU/L   AST 13 0 - 40 IU/L   ALT 10 0 - 32 IU/L  Hepatitis B surface antigen     Status: None   Collection Time: 03/03/19  9:42 AM  Result Value Ref Range   Hepatitis B Surface Ag Negative Negative  Hepatitis C antibody     Status: None   Collection Time: 03/03/19  9:42 AM  Result Value Ref Range   Hep C Virus Ab 0.1 0.0 - 0.9 s/co ratio    Comment:                                   Negative:     < 0.8                              Indeterminate: 0.8 - 0.9                                   Positive:     > 0.9  The CDC recommends that a positive HCV antibody result  be followed up with a HCV Nucleic Acid Amplification  test (562130).   HIV Antibody (routine testing w rflx)     Status: None   Collection Time: 03/03/19  9:42 AM  Result Value Ref Range   HIV Screen 4th Generation wRfx Non Reactive Non Reactive  RPR     Status: None  Collection Time: 03/03/19  9:42 AM  Result Value Ref Range   RPR Ser Ql Non Reactive Non Reactive  VITAMIN D 25 Hydroxy (Vit-D Deficiency, Fractures)     Status: None   Collection Time: 03/03/19  9:42 AM  Result Value Ref Range   Vit D, 25-Hydroxy 67.9 30.0 - 100.0 ng/mL    Comment: Vitamin D deficiency has been defined by the Institute of Medicine and an Endocrine Society practice guideline as a level of serum 25-OH vitamin D less than 20 ng/mL (1,2). The Endocrine Society went on to further define vitamin D insufficiency as a level between 21 and 29 ng/mL (2). 1. IOM (Institute of Medicine). 2010. Dietary reference    intakes for calcium and D. [place]: The    [employer]. 2. Holick MF, Binkley [place], Bischoff-Ferrari HA, et al.    Evaluation, treatment, and prevention of vitamin D    deficiency: an Endocrine Society clinical practice    guideline. JCEM. 2011 Jul; 96(7):1911-30.   Specimen status report     Status: None    Collection Time: 03/03/19  9:42 AM  Result Value Ref Range   specimen status report Comment     Comment: Written Authorization Written Authorization Written Authorization Received. [facility]2019 Logged by Diona Stutts   Novel Coronavirus, NAA ([facility])     Status: None   Collection Time: 05/04/19 10:16 AM   Specimen: Oropharyngeal(OP) collection in vial transport medium   OROPHARYNGEA  TESTING  Result Value Ref Range   SARS-CoV-2, NAA Not Detected Not Detected    Comment: This nucleic acid amplification test was developed and its performance characteristics determined by [facility]. Nucleic acid amplification tests include PCR and TMA. This test has not been FDA cleared or approved. This test has been authorized by FDA under an Emergency Use Authorization (EUA). This test is only authorized for the duration of time the declaration that circumstances exist justifying the authorization of the emergency use of in vitro diagnostic tests for detection of SARS-CoV-2 virus and/or diagnosis of COVID-19 infection under section 564(b)(1) of the Act, 21 U.S.C. 161WRU-0(A360bbb-3(b) (1), unless the authorization is terminated or revoked sooner. When diagnostic testing is negative, the possibility of a false negative result should be considered in the context of a patient's recent exposures and the presence of clinical signs and symptoms consistent with COVID-19. An individual without symptoms of COVID-19 and who is not shedding SARS-CoV-2 virus would  expect to have a negative (not detected) result in this assay.       PHQ2/9: Depression screen [facility] 05/31/2019 04/19/2019 02/28/2019 02/07/2019 01/12/2019  Decreased Interest 0 0 0 0 0  Down, Depressed, Hopeless - 0 0 0 0  PHQ - 2 Score 0 0 0 0 0  Altered sleeping 0 0 1 0 3  Tired, decreased energy 0 0 0 0 2  Change in appetite 0 0 0 0 0  Feeling bad or failure about yourself  0 0 0 0 0  Trouble  concentrating 0 0 0 0 1  Moving slowly or fidgety/restless 0 0 0 0 0  Suicidal thoughts 0 0 0 0 0  PHQ-9 Score 0 0 1 0 6  Difficult doing work/chores Not difficult at all Not difficult at all Not difficult at all Not difficult at all Somewhat difficult    phq 9 is negative   Fall Risk: Fall Risk  05/31/2019 04/19/2019 02/28/2019 02/07/2019 09/22/2018  Falls in the past year? 0  0 0 0 0  Number falls in past yr: 0 0 0 0 0  Injury with Fall? 0 0 0 0 0  Follow up - - Falls evaluation completed - -    Functional Status Survey: Is the patient deaf or have difficulty hearing?: No Does the patient have difficulty seeing, even when wearing glasses/contacts?: Yes Does the patient have difficulty concentrating, remembering, or making decisions?: No Does the patient have difficulty walking or climbing stairs?: No Does the patient have difficulty dressing or bathing?: No Does the patient have difficulty doing errands alone such as visiting a doctor's office or shopping?: No    Assessment & Plan  1. New onset headache  - MR BRAIN W WO CONTRAST; Future  2. Need for immunization against influenza  - Flu Vaccine QUAD 36+ mos IM  3. Papilledema  - MR BRAIN W WO CONTRAST; Future

## 2019-05-31 NOTE — Telephone Encounter (Signed)
Copied from [place] [id number]. Topic: General - Other >> May 31, 2019  3:33 PM Yvette Rack wrote: Reason for CRM: Pt stated that she needs a doctor's note for her part time job on the weekends. Pt stated currently the dates that she has missed work are 10/10/-10/11 and 10/17-10/18, but she is not sure when she will be able to return because she is scheduled to have a MRI. Pt requests call back once the doctor note is ready

## 2019-06-01 ENCOUNTER — Encounter: Payer: Self-pay | Admitting: Family Medicine

## 2019-06-01 NOTE — Telephone Encounter (Signed)
Entered excuse from work from May 21, 2019 through November 2 and send to patient through [place].

## 2019-06-02 ENCOUNTER — Encounter: Payer: Self-pay | Admitting: Family Medicine

## 2019-06-06 ENCOUNTER — Other Ambulatory Visit: Payer: Self-pay

## 2019-06-06 ENCOUNTER — Encounter: Payer: Self-pay | Admitting: Family Medicine

## 2019-06-06 ENCOUNTER — Ambulatory Visit: Payer: Managed Care, Other (non HMO) | Admitting: Family Medicine

## 2019-06-06 VITALS — BP 110/64 | HR 89 | Temp 97.1°F | Resp 16 | Ht 63.0 in | Wt 158.8 lb

## 2019-06-06 DIAGNOSIS — H4710 Unspecified papilledema: Secondary | ICD-10-CM | POA: Diagnosis not present

## 2019-06-06 DIAGNOSIS — R519 Headache, unspecified: Secondary | ICD-10-CM

## 2019-06-06 MED ORDER — NORTRIPTYLINE HCL 10 MG PO CAPS: 10.0000 mg | ORAL_CAPSULE | Freq: Every day | ORAL | 0 refills | Status: DC

## 2019-06-06 NOTE — Progress Notes (Signed)
Name: [name] [name]   MRN: [id number]    DOB: Sep 01, 1970   Date:06/06/2019       Progress Note  Subjective  Chief Complaint  Chief Complaint  Patient presents with  . FMLA    Seeing Dr. Sherryll Burger, [facility] on Wednesday at 3:15 p.m. 06/08/2019 and had MRI    HPI   Acute onset of headache: she states symptoms started about 3  weeks ago. Initially felt some nausea when looking down on her phone, it resolved but for a few days had recurrence of photophobia, and dizziness when looking into a screen. A few days later she noticed occipital headache, associated with vomiting, she took ibuprofen and went to bed. The following morning she woke up symptom free but two hours later symptoms returned and were associated with dizziness. She felt it could be secondary to eye problems. She tried to go to [employer] and they were booked. She saw Dr. Alvester Morin on October 13 th and had a ONH and RNFL analysis and found the optic disc to be slightly abnormal and may have increase pressure. We placed a referral to neurologist , appointment this week.  She states she is avoiding screen time and seems to help with symptoms. She has been wearing a shades. She states dizziness still triggered by screen use. Occipital headache still present daily, worse when she lays flat and also with head movement.  No neuro deficit  . She had a normal MRI last week. She told me today she was seen by neurologist a few years ago for migraine episodes and was given Nortriptyline. We will resume medications and fill out papers for her part time job. Explained that when ophthalmologist  called me he did not call it papilledema but a slight change in disk size, he wanted her to be evaluated because of new onset headaches, but now with a history of migraines it may not be a significant finding   Patient Active Problem List   Diagnosis Date Noted  . Perennial allergic rhinitis with seasonal variation 04/19/2019  . Asthma, mild intermittent  07/19/2018  . 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 Post-traumatic osteoarthritis of right foot 09/08/2016  . Endometriosis of pelvis 01/05/2013    Past Surgical History:  Procedure Laterality Date  . ANKLE SURGERY    . ANKLE SURGERY Right 06/11/2017  . APPENDECTOMY  2008  . CESAREAN SECTION  1995  . CHOLECYSTECTOMY  2000  . LAPAROSCOPIC ENDOMETRIOSIS FULGURATION    . LAPAROSCOPY     for endometriosis  . WISDOM TOOTH EXTRACTION     x2    Family History  Problem Relation Age of Onset  . COPD Mother   . Emphysema Mother   . Cancer Father        unknown  . Cancer Paternal Aunt        ovarian    Social History   Socioeconomic History  . Marital status: Single    Spouse name: Not on file  . Number of children: 1  . Years of education: Not on file  . Highest education level: Bachelor's degree (e.g., BA, AB, BS)  Occupational History  . Not on file  Social Needs  . Financial resource strain: Not hard at all  . Food insecurity    Worry: Never true    Inability: Never true  . Transportation needs    Medical: No    Non-medical: No  Tobacco Use  . Smoking status: Never  Smoker  . Smokeless tobacco: Never Used  Substance and Sexual Activity  . Alcohol use: No    Alcohol/week: 0.0 standard drinks  . Drug use: No  . Sexual activity: Yes    Birth control/protection: Inserts    Comment: Nuva ring  Lifestyle  . Physical activity    Days per week: 0 days    Minutes per session: 0 min  . Stress: Only a little  Relationships  . Social connections    Talks on phone: More than three times a week    Gets together: More than three times a week    Attends religious service: More than 4 times per year    Active member of club or organization: No    Attends meetings of clubs or organizations: Never    Relationship status: Never married  . Intimate partner violence    Fear of current or ex partner: No    Emotionally abused: No     Physically abused: No    Forced sexual activity: No  Other Topics Concern  . Not on file  Social History Narrative   Raised by her paternal aunt. She died in March 7782 from PE , complication of knee surgery      Current Outpatient Medications:  .  albuterol (VENTOLIN HFA) 108 (90 Base) MCG/ACT inhaler, Inhale 2 puffs into the lungs every 6 (six) hours as needed for wheezing or shortness of breath., Disp: 18 g, Rfl: 1 .  Azelastine HCl 0.15 % SOLN, Place 1 spray into the nose daily., Disp: 90 mL, Rfl: 1 .  cyclobenzaprine (FLEXERIL) 10 MG tablet, Take 1 tablet (10 mg total) by mouth 3 (three) times daily as needed for muscle spasms., Disp: 60 tablet, Rfl: 0 .  desonide (DESOWEN) 0.05 % cream, APPLY ON FACE TOPICALLY TWICE DAILY, Disp: 30 g, Rfl: 0 .  fluticasone (FLONASE) 50 MCG/ACT nasal spray, Place 2 sprays into both nostrils daily., Disp: 48 g, Rfl: 1 .  fluticasone furoate-vilanterol (BREO ELLIPTA) 100-25 MCG/INH AEPB, Inhale 1 puff by mouth once daily, Disp: 60 each, Rfl: 5 .  ibuprofen (ADVIL) 800 MG tablet, Take 1 tablet (800 mg total) by mouth every 8 (eight) hours as needed., Disp: 60 tablet, Rfl: 1 .  levocetirizine (XYZAL) 5 MG tablet, Take 1 tablet (5 mg total) by mouth daily as needed for allergies. (Patient taking differently: Take 5 mg by mouth at bedtime. ), Disp: 30 tablet, Rfl: 11 .  loratadine (CLARITIN) 10 MG tablet, Take 1 tablet (10 mg total) by mouth every morning., Disp: 90 tablet, Rfl: 1 .  montelukast (SINGULAIR) 10 MG tablet, Take 1 tablet (10 mg total) by mouth daily., Disp: 90 tablet, Rfl: 1 .  NUVARING 0.12-0.015 MG/24HR vaginal ring, INSERT ONE RING VAGINALLY EVERY 28 DAYS. USE IN CONTINUOUS FASHION FOR ENDOMETRIOSIS., Disp: 3 each, Rfl: 12 .  pantoprazole (PROTONIX) 40 MG tablet, Take 1 tablet (40 mg total) by mouth daily., Disp: 90 tablet, Rfl: 1 .  Spacer/Aero-Holding Chambers DEVI, 1 each by Does not apply route daily., Disp: 1 each, Rfl: 0 .  triamcinolone  cream (KENALOG) 0.1 %, Apply topically 2 (two) times daily., Disp: 453.6 g, Rfl: 0  Allergies  Allergen Reactions  . Symbicort [Budesonide-Formoterol Fumarate] Itching  . Mosquito (Diagnostic) Swelling    I personally reviewed active problem list, medication list, allergies, family history, social history, health maintenance with the patient/caregiver today.   ROS  Constitutional: Negative for fever or weight change.  Respiratory: Negative for cough  and shortness of breath.   Cardiovascular: Negative for chest pain or palpitations.  Gastrointestinal: Negative for abdominal pain, no bowel changes.  Musculoskeletal: Negative for gait problem or joint swelling.  Skin: Negative for rash.  Neurological: Positive  for dizziness or headache.  No other specific complaints in a complete review of systems (except as listed in HPI above).  Objective  Vitals:   06/06/19 0934  BP: 110/64  Pulse: 89  Resp: 16  Temp: (!) 97.1 F (36.2 C)  TempSrc: Temporal  SpO2: 98%  Weight: 158 lb 12.8 oz (72 kg)  Height: 5\' 3"  (1.6 m)    Body mass index is 28.13 kg/m.  Physical Exam  Constitutional: Patient appears well-developed and well-nourished. Overweight. No distress.  HEENT: head atraumatic, normocephalic, pupils equal and reactive to light Cardiovascular: Normal rate, regular rhythm and normal heart sounds.  No murmur heard. No BLE edema. Pulmonary/Chest: Effort normal and breath sounds normal. No respiratory distress. Abdominal: Soft.  There is no tenderness. Psychiatric: Patient has a normal mood and affect. behavior is normal. Judgment and thought content normal. Neurological: no focal findings, photophobia wearing shades  Recent Results (from the past 2160 hour(s))  Novel Coronavirus, NAA ([facility])     Status: None   Collection Time: 05/04/19 10:16 AM   Specimen: Oropharyngeal(OP) collection in vial transport medium   OROPHARYNGEA  TESTING  Result Value Ref Range   SARS-CoV-2,  NAA Not Detected Not Detected    Comment: This nucleic acid amplification test was developed and its performance characteristics determined by [facility]. Nucleic acid amplification tests include PCR and TMA. This test has not been FDA cleared or approved. This test has been authorized by FDA under an Emergency Use Authorization (EUA). This test is only authorized for the duration of time the declaration that circumstances exist justifying the authorization of the emergency use of in vitro diagnostic tests for detection of SARS-CoV-2 virus and/or diagnosis of COVID-19 infection under section 564(b)(1) of the Act, 21 U.S.C. 191YNW-2(N360bbb-3(b) (1), unless the authorization is terminated or revoked sooner. When diagnostic testing is negative, the possibility of a false negative result should be considered in the context of a patient's recent exposures and the presence of clinical signs and symptoms consistent with COVID-19. An individual without symptoms of COVID-19 and who is not shedding SARS-CoV-2 virus would  expect to have a negative (not detected) result in this assay.       PHQ2/9: Depression screen [facility] 2/9 06/06/2019 05/31/2019 04/19/2019 02/28/2019 02/07/2019  Decreased Interest 0 0 0 0 0  Down, Depressed, Hopeless 0 - 0 0 0  PHQ - 2 Score 0 0 0 0 0  Altered sleeping 0 0 0 1 0  Tired, decreased energy 0 0 0 0 0  Change in appetite 0 0 0 0 0  Feeling bad or failure about yourself  0 0 0 0 0  Trouble concentrating 0 0 0 0 0  Moving slowly or fidgety/restless 0 0 0 0 0  Suicidal thoughts 0 0 0 0 0  PHQ-9 Score 0 0 0 1 0  Difficult doing work/chores - Not difficult at all Not difficult at all Not difficult at all Not difficult at all    phq 9 is negative   Fall Risk: Fall Risk  05/31/2019 04/19/2019 02/28/2019 02/07/2019 09/22/2018  Falls in the past year? 0 0 0 0 0  Number falls in past yr: 0 0 0 0 0  Injury with Fall? 0 0 0 0 0  Follow up - - Falls evaluation completed -  -     Assessment & Plan  1. New onset headache  FMLA papers filled out for her part time job at [employer] today Time off from 05/21/2019 till 06/21/2019 pending evaluation from neurologist this week   She has a remote history of migraine, and it may be a recurrence   2. Papilledema  Borderline

## 2019-06-28 ENCOUNTER — Other Ambulatory Visit: Payer: Self-pay | Admitting: Neurology

## 2019-06-28 DIAGNOSIS — G932 Benign intracranial hypertension: Secondary | ICD-10-CM

## 2019-07-01 ENCOUNTER — Ambulatory Visit
Admission: RE | Admit: 2019-07-01 | Discharge: 2019-07-01 | Disposition: A | Discharge status: Home / Self Care | Payer: Managed Care, Other (non HMO) | Source: Ambulatory Visit | Attending: Neurology | Admitting: Neurology

## 2019-07-01 ENCOUNTER — Other Ambulatory Visit: Payer: Self-pay

## 2019-07-01 DIAGNOSIS — G932 Benign intracranial hypertension: Secondary | ICD-10-CM

## 2019-07-01 LAB — CBC
HCT: 42.6 % (ref 36.0–46.0)
Hemoglobin: 14.4 g/dL (ref 12.0–15.0)
MCH: 28.7 pg (ref 26.0–34.0)
MCHC: 33.8 g/dL (ref 30.0–36.0)
MCV: 85 fL (ref 80.0–100.0)
Platelets: 318 10*3/uL (ref 150–400)
RBC: 5.01 MIL/uL (ref 3.87–5.11)
RDW: 13.8 % (ref 11.5–15.5)
WBC: 6.3 10*3/uL (ref 4.0–10.5)
nRBC: 0 % (ref 0.0–0.2)

## 2019-07-01 LAB — POCT PREGNANCY, URINE
Preg Test, Ur: NEGATIVE
Preg Test, Ur: NEGATIVE

## 2019-07-01 LAB — PROTIME-INR
INR: 1 (ref 0.8–1.2)
Prothrombin Time: 12.6 seconds (ref 11.4–15.2)

## 2019-07-01 LAB — APTT: aPTT: 25 seconds (ref 24–36)

## 2019-07-01 MED ORDER — LIDOCAINE HCL (PF) 1 % IJ SOLN
5.0000 mL | Freq: Once | INTRAMUSCULAR | Status: AC
  Administered 2019-07-01: 5 mL
  Filled 2019-07-01: qty 5

## 2019-07-01 NOTE — Progress Notes (Signed)
Urine pregnancy negative  

## 2019-07-04 NOTE — Progress Notes (Signed)
Patient called this AM with complaints of headache, nausea and vomiting over the weekend s/p lumbar puncture on Friday.  Patient states she was still having headaches as of Saturday with onset of vomiting x several episodes ; Patient reports she called the neurologist office and was given prescriptions for antiemetics on Saturday.  Patient continues to have headache upon standing or sitting; reports relief when lying flat.  Patient states she is no longer having nausea/vomiting at this time.  This RN advised the patient to call back to the neurologist office or seek immediate medical care to report to them the continued  headaches upon standing and sitting.

## 2019-07-05 ENCOUNTER — Emergency Department
Admission: EM | Admit: 2019-07-05 | Discharge: 2019-07-05 | Disposition: A | Discharge status: Home / Self Care | Payer: Managed Care, Other (non HMO) | Attending: Emergency Medicine | Admitting: Emergency Medicine

## 2019-07-05 ENCOUNTER — Other Ambulatory Visit: Payer: Self-pay

## 2019-07-05 ENCOUNTER — Encounter: Payer: Self-pay | Admitting: Medical Oncology

## 2019-07-05 DIAGNOSIS — R112 Nausea with vomiting, unspecified: Secondary | ICD-10-CM | POA: Diagnosis not present

## 2019-07-05 DIAGNOSIS — R519 Headache, unspecified: Secondary | ICD-10-CM | POA: Diagnosis present

## 2019-07-05 DIAGNOSIS — Z20828 Contact with and (suspected) exposure to other viral communicable diseases: Secondary | ICD-10-CM | POA: Insufficient documentation

## 2019-07-05 DIAGNOSIS — G971 Other reaction to spinal and lumbar puncture: Secondary | ICD-10-CM | POA: Diagnosis not present

## 2019-07-05 DIAGNOSIS — M542 Cervicalgia: Secondary | ICD-10-CM | POA: Insufficient documentation

## 2019-07-05 DIAGNOSIS — J45909 Unspecified asthma, uncomplicated: Secondary | ICD-10-CM | POA: Insufficient documentation

## 2019-07-05 LAB — COMPREHENSIVE METABOLIC PANEL
ALT: 13 U/L (ref 0–44)
AST: 13 U/L — ABNORMAL LOW (ref 15–41)
Albumin: 3.7 g/dL (ref 3.5–5.0)
Alkaline Phosphatase: 73 U/L (ref 38–126)
Anion gap: 10 (ref 5–15)
BUN: 11 mg/dL (ref 6–20)
CO2: 19 mmol/L — ABNORMAL LOW (ref 22–32)
Calcium: 8.6 mg/dL — ABNORMAL LOW (ref 8.9–10.3)
Chloride: 108 mmol/L (ref 98–111)
Creatinine, Ser: 0.61 mg/dL (ref 0.44–1.00)
GFR calc Af Amer: >60 mL/min (ref >60)
GFR calc non Af Amer: >60 mL/min (ref >60)
Glucose, Bld: 110 mg/dL — ABNORMAL HIGH (ref 70–99)
Potassium: 3.5 mmol/L (ref 3.5–5.1)
Sodium: 137 mmol/L (ref 135–145)
Total Bilirubin: 0.8 mg/dL (ref 0.3–1.2)
Total Protein: 7.3 g/dL (ref 6.5–8.1)

## 2019-07-05 LAB — CBC
HCT: 43.4 % (ref 36.0–46.0)
Hemoglobin: 14.9 g/dL (ref 12.0–15.0)
MCH: 28.4 pg (ref 26.0–34.0)
MCHC: 34.3 g/dL (ref 30.0–36.0)
MCV: 82.8 fL (ref 80.0–100.0)
Platelets: 372 10*3/uL (ref 150–400)
RBC: 5.24 MIL/uL — ABNORMAL HIGH (ref 3.87–5.11)
RDW: 13.4 % (ref 11.5–15.5)
WBC: 11.3 10*3/uL — ABNORMAL HIGH (ref 4.0–10.5)
nRBC: 0 % (ref 0.0–0.2)

## 2019-07-05 LAB — LIPASE, BLOOD: Lipase: 19 U/L (ref 11–51)

## 2019-07-05 MED ORDER — SODIUM CHLORIDE 0.9 % IV SOLN
Freq: Once | INTRAVENOUS | Status: AC
  Administered 2019-07-05: 13:00:00 via INTRAVENOUS

## 2019-07-05 MED ORDER — DIPHENHYDRAMINE HCL 50 MG/ML IJ SOLN
25.0000 mg | Freq: Once | INTRAMUSCULAR | Status: AC
  Administered 2019-07-05: 25 mg via INTRAVENOUS
  Filled 2019-07-05: qty 1

## 2019-07-05 MED ORDER — METOCLOPRAMIDE HCL 5 MG/ML IJ SOLN
10.0000 mg | Freq: Once | INTRAMUSCULAR | Status: AC
  Administered 2019-07-05: 10 mg via INTRAVENOUS
  Filled 2019-07-05: qty 2

## 2019-07-05 MED ORDER — SODIUM CHLORIDE 0.9 % IV SOLN: Freq: Once | INTRAVENOUS | Status: DC

## 2019-07-05 MED ORDER — KETOROLAC TROMETHAMINE 30 MG/ML IJ SOLN
30.0000 mg | Freq: Once | INTRAMUSCULAR | Status: AC
  Administered 2019-07-05: 30 mg via INTRAVENOUS
  Filled 2019-07-05: qty 1

## 2019-07-05 MED ORDER — SODIUM CHLORIDE 0.9 % IV SOLN
500.0000 mg | Freq: Once | INTRAVENOUS | Status: AC
  Administered 2019-07-05: 500 mg via INTRAVENOUS
  Filled 2019-07-05: qty 2

## 2019-07-05 MED ORDER — OXYCODONE-ACETAMINOPHEN 5-325 MG PO TABS: 1.0000 | ORAL_TABLET | Freq: Three times a day (TID) | ORAL | 0 refills | Status: DC | PRN

## 2019-07-05 NOTE — ED Triage Notes (Signed)
Pt reports she was seen here Friday and they did a LP to check for occular issues. States that since the LP she has been having worsening headache, neck pain and nausea and vomiting. Pt denies fever. A/O x 4. Light sensitivity.

## 2019-07-05 NOTE — ED Provider Notes (Signed)
[facility] Emergency Department Provider Note       Time seen: ----------------------------------------- 12:11 PM on 07/05/2019 -----------------------------------------   I have reviewed the triage vital signs and the nursing notes.  HISTORY   Chief Complaint Headache, Neck Pain, Nausea, and Emesis    HPI [name] [name] is a 48 y.o. female with a history of allergies, arthritis, asthma, constipation who presents to the ED for headache, neck pain, nausea and vomiting.  Patient denies any fever.  Patient states she was here Friday and had an LP done.  Since then she has been having worsening symptoms.  She has not thrown up in several days but complains of diffuse headache that is different than the headache she had prior to the LP.  Discomfort is 10 out of 10 in her head.  Past Medical History:  Diagnosis Date  . Allergy   . Arthritis   . Asthma   . Constipation   . Endometriosis    Uses Nuvaring continuously  . Heart murmur   . Ulcer     Patient Active Problem List   Diagnosis Date Noted  . Perennial allergic rhinitis with seasonal variation 04/19/2019  . Asthma, mild intermittent 07/19/2018  . 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 Post-traumatic osteoarthritis of right foot 09/08/2016  . Endometriosis of pelvis 01/05/2013    Past Surgical History:  Procedure Laterality Date  . ANKLE SURGERY    . ANKLE SURGERY Right 06/11/2017  . APPENDECTOMY  2008  . CESAREAN SECTION  1995  . CHOLECYSTECTOMY  2000  . LAPAROSCOPIC ENDOMETRIOSIS FULGURATION    . LAPAROSCOPY     for endometriosis  . WISDOM TOOTH EXTRACTION     x2    Allergies Symbicort [budesonide-formoterol fumarate] and Mosquito (diagnostic)  Social History Social History   Tobacco Use  . Smoking status: Never Smoker  . Smokeless tobacco: Never Used  Substance Use Topics  . Alcohol use: No    Alcohol/week: 0.0 standard drinks  . Drug use:  No   Review of Systems Constitutional: Negative for fever. Cardiovascular: Negative for chest pain. Respiratory: Negative for shortness of breath. Gastrointestinal: Negative for abdominal pain, positive for recent nausea and vomiting Musculoskeletal: Negative for back pain. Skin: Negative for rash. Neurological: Positive for headache  All systems negative/normal/unremarkable except as stated in the HPI  ____________________________________________   PHYSICAL EXAM:  VITAL SIGNS: ED Triage Vitals  Enc Vitals Group     BP 07/05/19 1049 (!) 155/84     Pulse Rate 07/05/19 1049 100     Resp 07/05/19 1049 20     Temp 07/05/19 1049 98 F (36.7 C)     Temp Source 07/05/19 1049 Oral     SpO2 07/05/19 1049 100 %     Weight 07/05/19 1050 158 lb (71.7 kg)     Height 07/05/19 1050 5\' 3"  (1.6 m)     Head Circumference --      Peak Flow --      Pain Score 07/05/19 1050 10     Pain Loc --      Pain Edu? --      Excl. in [place]? --    Constitutional: Alert and oriented.  Mild distress from pain Eyes: Conjunctivae are normal. Normal extraocular movements.  Mild photophobia ENT      Head: Normocephalic and atraumatic.      Nose: No congestion/rhinnorhea.      Mouth/Throat: Mucous membranes are moist.  Neck: No stridor. No meningismus Cardiovascular: Normal rate, regular rhythm. No murmurs, rubs, or gallops. Respiratory: Normal respiratory effort without tachypnea nor retractions. Breath sounds are clear and equal bilaterally. No wheezes/rales/rhonchi. Gastrointestinal: Soft and nontender. Normal bowel sounds Musculoskeletal: Nontender with normal range of motion in extremities. No lower extremity tenderness nor edema. Neurologic:  Normal speech and language. No gross focal neurologic deficits are appreciated.  Skin:  Skin is warm, dry and intact. No rash noted. Psychiatric: Mood and affect are normal. Speech and behavior are normal.  ___________________________________________  ED  COURSE:  As part of my medical decision making, I reviewed the following data within the electronic MEDICAL RECORD NUMBER History obtained from family if available, nursing notes, old chart and ekg, as well as notes from prior ED visits. Patient presented for headache as well as nausea vomiting after lumbar puncture, we will assess with labs and imaging as indicated at this time. Clinical Course as of Jul 05 1543  Tue Jul 05, 2019  1455 Patient reports she if feeling much better    [JW]    Clinical Course User Index [JW] [name], MD   Procedures  Tania Steinhauser Mapp was evaluated in Emergency Department on 07/05/2019 for the symptoms described in the history of present illness. She was evaluated in the context of the global COVID-19 pandemic, which necessitated consideration that the patient might be at risk for infection with the SARS-CoV-2 virus that causes COVID-19. Institutional protocols and algorithms that pertain to the evaluation of patients at risk for COVID-19 are in a state of rapid change based on information released by regulatory bodies including the CDC and federal and state organizations. These policies and algorithms were followed during the patient's care in the ED.  ____________________________________________   LABS (pertinent positives/negatives)  Labs Reviewed  COMPREHENSIVE METABOLIC PANEL - Abnormal; Notable for the following components:      Result Value   CO2 19 (*)    Glucose, Bld 110 (*)    Calcium 8.6 (*)    AST 13 (*)    All other components within normal limits  CBC - Abnormal; Notable for the following components:   WBC 11.3 (*)    RBC 5.24 (*)    All other components within normal limits  LIPASE, BLOOD  URINALYSIS, COMPLETE (UACMP) WITH MICROSCOPIC   ____________________________________________   DIFFERENTIAL DIAGNOSIS   Post LP headache, pseudotumor, meningitis unlikely, dehydration, migraine, tension headache  FINAL ASSESSMENT AND  PLAN  Post LP headache   Plan: The patient had presented for worsening headache and vomiting after recent LP. Patient's labs were overall reassuring.  She had resolution in her symptoms after fluids, Reglan Toradol and Benadryl.  We gave her additional IV caffeine as well.  I attempted to arrange a blood patch for her but was unsuccessful.  I will refer her to the pain clinic should discontinue.    Ulice Dash, MD    Note: This note was generated in part or whole with voice recognition software. Voice recognition is usually quite accurate but there are transcription errors that can and very often do occur. I apologize for any typographical errors that were not detected and corrected.     [name], MD 07/05/19 [phone]

## 2019-07-06 LAB — SARS CORONAVIRUS 2 (TAT 6-24 HRS): SARS Coronavirus 2: NEGATIVE

## 2019-07-11 ENCOUNTER — Encounter: Payer: Self-pay | Admitting: Family Medicine

## 2019-07-11 ENCOUNTER — Other Ambulatory Visit: Payer: Self-pay

## 2019-07-11 ENCOUNTER — Ambulatory Visit (INDEPENDENT_AMBULATORY_CARE_PROVIDER_SITE_OTHER): Payer: Managed Care, Other (non HMO) | Admitting: Family Medicine

## 2019-07-11 VITALS — Temp 98.0°F | Ht 63.0 in | Wt 158.0 lb

## 2019-07-11 DIAGNOSIS — J069 Acute upper respiratory infection, unspecified: Secondary | ICD-10-CM

## 2019-07-11 MED ORDER — DOXYCYCLINE HYCLATE 100 MG PO TABS: 100.0000 mg | ORAL_TABLET | Freq: Two times a day (BID) | ORAL | 0 refills | Status: AC

## 2019-07-11 MED ORDER — BENZONATATE 100 MG PO CAPS: 100.0000 mg | ORAL_CAPSULE | Freq: Three times a day (TID) | ORAL | 0 refills | Status: DC | PRN

## 2019-07-11 MED ORDER — PREDNISONE 20 MG PO TABS: 40.0000 mg | ORAL_TABLET | Freq: Every day | ORAL | 0 refills | Status: DC

## 2019-07-11 NOTE — Progress Notes (Signed)
Name: [name] SallesJessie S [name]   MRN: [id number]    DOB: 1970-11-09   Date:07/11/2019       Progress Note  Subjective:    Chief Complaint  Chief Complaint  Patient presents with   sore thorat    stuffy nose, and slight cough.  Onset 3 days    I connected with  [name] [name]  on 07/11/19 at  2:40 PM EST by a video enabled telemedicine application and verified that I am speaking with the correct person using two identifiers.  I discussed the limitations of evaluation and management by telemedicine and the availability of in person appointments. The patient expressed understanding and agreed to proceed. Staff also discussed with the patient that there may be a patient responsible charge related to this service. Patient Location: home Provider [facility] Additional Individuals present: none  URI  This is a new problem. Episode onset: 2 days ago. The problem has been gradually worsening. Associated symptoms include congestion, coughing, headaches, nausea, neck pain, rhinorrhea, sinus pain and a sore throat. Pertinent negatives include no abdominal pain, chest pain, diarrhea, dysuria, ear pain, joint pain, joint swelling, sneezing, swollen glands or wheezing.  She denies any productive sputum, chest pain, chest tightness, wheezing, shortness of breath, sweats, chills.  She does have some neck pain and slight right shoulder pain but she denies any diffuse myalgias.  She did recently have a spinal tap to try and diagnose her headache -suspected to have idiopathic intracranial hypertension the tap was negative and afterward she did have a severe headache which led her to the ER last week.  Her headache symptoms have gradually gotten better when they were severe she could barely lift her head up and she did have associated neck pain headache, nausea decreased appetite.  Her headache is much better she is able to look at me on her phone she still has some nausea and decreased appetite but overall her  headache is gradually improving.   Boyfriend is sick, and is still sick, his sx started last Wednesday and she has not been around anyone else.  She has gone to the hospital for HA, and did have Neg COVID test 6 days ago (one day before he developed sx). Boyfriend is sick and going to get covid tested  She does not think she has Covid she thinks is just a cold she has a little bit of tenderness in her maxillary sinuses bilaterally but it is not severe and she again denies fever.  She does need a work note   Patient Active Problem List   Diagnosis Date Noted   Perennial allergic rhinitis with seasonal variation 04/19/2019   Asthma, mild intermittent 07/19/2018   Cervical high risk HPV (human papillomavirus) test positive 02/27/2017   Post-traumatic osteoarthritis of right foot 09/08/2016   Endometriosis of pelvis 01/05/2013    Social History   Tobacco Use   Smoking status: Never Smoker   Smokeless tobacco: Never Used  Substance Use Topics   Alcohol use: No    Alcohol/week: 0.0 standard drinks     Current Outpatient Medications:    albuterol (VENTOLIN HFA) 108 (90 Base) MCG/ACT inhaler, Inhale 2 puffs into the lungs every 6 (six) hours as needed for wheezing or shortness of breath., Disp: 18 g, Rfl: 1   Azelastine HCl 0.15 % SOLN, Place 1 spray into the nose daily., Disp: 90 mL, Rfl: 1   cyclobenzaprine (FLEXERIL) 10 MG tablet, Take 1 tablet (10 mg total) by mouth  3 (three) times daily as needed for muscle spasms., Disp: 60 tablet, Rfl: 0   desonide (DESOWEN) 0.05 % cream, APPLY ON FACE TOPICALLY TWICE DAILY, Disp: 30 g, Rfl: 0   fluticasone (FLONASE) 50 MCG/ACT nasal spray, Place 2 sprays into both nostrils daily., Disp: 48 g, Rfl: 1   fluticasone furoate-vilanterol (BREO ELLIPTA) 100-25 MCG/INH AEPB, Inhale 1 puff by mouth once daily, Disp: 60 each, Rfl: 5   ibuprofen (ADVIL) 800 MG tablet, Take 1 tablet (800 mg total) by mouth every 8 (eight) hours as needed., Disp:  60 tablet, Rfl: 1   levocetirizine (XYZAL) 5 MG tablet, Take 1 tablet (5 mg total) by mouth daily as needed for allergies. (Patient taking differently: Take 5 mg by mouth at bedtime. ), Disp: 30 tablet, Rfl: 11   loratadine (CLARITIN) 10 MG tablet, Take 1 tablet (10 mg total) by mouth every morning., Disp: 90 tablet, Rfl: 1   montelukast (SINGULAIR) 10 MG tablet, Take 1 tablet (10 mg total) by mouth daily., Disp: 90 tablet, Rfl: 1   nortriptyline (PAMELOR) 10 MG capsule, Take 1-2 capsules (10-20 mg total) by mouth at bedtime., Disp: 60 capsule, Rfl: 0   NUVARING 0.12-0.015 MG/24HR vaginal ring, INSERT ONE RING VAGINALLY EVERY 28 DAYS. USE IN CONTINUOUS FASHION FOR ENDOMETRIOSIS., Disp: 3 each, Rfl: 12   oxyCODONE-acetaminophen (PERCOCET) 5-325 MG tablet, Take 1 tablet by mouth every 8 (eight) hours as needed., Disp: 20 tablet, Rfl: 0   pantoprazole (PROTONIX) 40 MG tablet, Take 1 tablet (40 mg total) by mouth daily., Disp: 90 tablet, Rfl: 1   SUMAtriptan (IMITREX) 100 MG tablet, Take 100 mg by mouth 2 (two) times daily as needed., Disp: , Rfl:    triamcinolone cream (KENALOG) 0.1 %, Apply topically 2 (two) times daily., Disp: 453.6 g, Rfl: 0   Spacer/Aero-Holding Chambers DEVI, 1 each by Does not apply route daily., Disp: 1 each, Rfl: 0  Allergies  Allergen Reactions   Symbicort [Budesonide-Formoterol Fumarate] Itching   Mosquito (Culex Pipiens) Allergy Skin Test Swelling   Mosquito (Diagnostic) Swelling    I personally reviewed active problem list, medication list, allergies, family history, social history, health maintenance, notes from last encounter, lab results with the patient/caregiver today.  Review of Systems  HENT: Positive for congestion, rhinorrhea, sinus pain and sore throat. Negative for ear pain and sneezing.   Respiratory: Positive for cough. Negative for wheezing.   Cardiovascular: Negative for chest pain.  Gastrointestinal: Positive for nausea. Negative for  abdominal pain and diarrhea.  Genitourinary: Negative for dysuria.  Musculoskeletal: Positive for neck pain. Negative for joint pain.  Neurological: Positive for headaches.     Objective:   Virtual encounter, vitals limited, only able to obtain the following Today's Vitals   07/11/19 1458  Temp: 98 F (36.7 C)  Weight: 158 lb (71.7 kg)  Height: 5\' 3"  (1.6 m)   Body mass index is 27.99 kg/m. Nursing Note and Vital Signs reviewed.  Physical Exam Vitals signs and nursing note reviewed.  Constitutional:      General: She is not in acute distress.    Appearance: Normal appearance. She is well-developed. She is not ill-appearing, toxic-appearing or diaphoretic.  HENT:     Head: Normocephalic and atraumatic.     Nose: Nose normal. No rhinorrhea.  Eyes:     General:        Right eye: No discharge.        Left eye: No discharge.     Conjunctiva/sclera: Conjunctivae normal.  Neck:     Trachea: No tracheal deviation.  Pulmonary:     Effort: Pulmonary effort is normal. No respiratory distress.     Breath sounds: No stridor.  Musculoskeletal: Normal range of motion.  Skin:    General: Skin is dry.     Coloration: Skin is not jaundiced or pale.     Findings: No rash.  Neurological:     Mental Status: She is alert.     Motor: No abnormal muscle tone.     Coordination: Coordination normal.  Psychiatric:        Mood and Affect: Mood normal.        Behavior: Behavior normal.     PE limited by telephone encounter  No results found for this or any previous visit (from the past 72 hour(s)).  Assessment and Plan:     ICD-10-CM   1. Upper respiratory tract infection, unspecified type  J06.9     Isolate at home in light of COVID pandemic supportive and symptomatic treatment - push fluids, take tylenol and ibuprofen as directed on bottle/box for fever and pain Can continue over the counter cold and cough medicines Encouraged to seek follow up if any wheeze or SOB or severe  fatigue or CP  I did offer to have her go test for Covid again but her boyfriend is getting tested we can use that as a test for the both of them, unlikely given that she was tested 1 day before the onset of his symptoms.  Patient understands isolation in light of Covid that she should remain at home not go on public until 10 days after the start of symptoms onset with more than 24 hours of fever free and improving respiratory symptoms.  Patient does have a history of asthma I did send in a steroid burst in case she develops asthma exacerbation she currently denies any wheeze or chest tightness She does also have a history of severe seasonal allergies and she is maxed out on Xyzal Claritin Singulair Flonase and did give her a prescription for doxycycline to cover both her sinuses and her chest if she should have any worsening symptoms such as severe acute sinus pain with fever or symptoms lasting longer than 10 to 12 days   -Red flags and when to present for emergency care or RTC including fever >101.59F, chest pain, shortness of breath, new/worsening/un-resolving symptoms,  reviewed with patient at time of visit. Follow up and care instructions discussed and provided in AVS. - I discussed the assessment and treatment plan with the patient. The patient was provided an opportunity to ask questions and all were answered. The patient agreed with the plan and demonstrated an understanding of the instructions.  I provided 15 minutes of non-face-to-face time during this encounter.  Danelle Berry, PA-C 07/11/19 3:13 PM

## 2019-07-11 NOTE — Patient Instructions (Addendum)
I did send in a steroid prescription that I want you to hold and only use if you are having any asthma exacerbation symptoms such as severe coughing fits, wheeze, chest tightness  I sent in a post dated antibiotic prescription for if you do have sudden onset of severe pain or sinus congestion the last longer than 10 to 12 days you could get and start the doxycycline antibiotic at this point I do not think you need it    This information is directly available on the CDC website: RunningShows.co.za.html    Source:CDC Reference to specific commercial products, manufacturers, companies, or trademarks does not constitute its endorsement or recommendation by the [place], [place], or Centers for [employer] and Prevention. Viral Respiratory Infection A viral respiratory infection is an illness that affects parts of the body that are used for breathing. These include the lungs, nose, and throat. It is caused by a germ called a virus. Some examples of this kind of infection are:  A cold.  The flu (influenza).  A respiratory syncytial virus (RSV) infection. A person who gets this illness may have the following symptoms:  A stuffy or runny nose.  Yellow or green fluid in the nose.  A cough.  Sneezing.  Tiredness (fatigue).  Achy muscles.  A sore throat.  Sweating or chills.  A fever.  A headache. Follow these instructions at home: Managing pain and congestion  Take over-the-counter and prescription medicines only as told by your doctor.  If you have a sore throat, gargle with salt water. Do this 3-4 times per day or as needed. To make a salt-water mixture, dissolve -1 tsp of salt in 1 cup of warm water. Make sure that all the salt dissolves.  Use nose drops made from salt water. This helps with stuffiness (congestion). It also helps soften the skin around your nose.  Drink  enough fluid to keep your pee (urine) pale yellow. General instructions   Rest as much as possible.  Do not drink alcohol.  Do not use any products that have nicotine or tobacco, such as cigarettes and e-cigarettes. If you need help quitting, ask your doctor.  Keep all follow-up visits as told by your doctor. This is important. How is this prevented?   Get a flu shot every year. Ask your doctor when you should get your flu shot.  Do not let other people get your germs. If you are sick: ? Stay home from work or school. ? Wash your hands with soap and water often. Wash your hands after you cough or sneeze. If soap and water are not available, use hand sanitizer.  Avoid contact with people who are sick during cold and flu season. This is in fall and winter. Get help if:  Your symptoms last for 10 days or longer.  Your symptoms get worse over time.  You have a fever.  You have very bad pain in your face or forehead.  Parts of your jaw or neck become very swollen. Get help right away if:  You feel pain or pressure in your chest.  You have shortness of breath.  You faint or feel like you will faint.  You keep throwing up (vomiting).  You feel confused. Summary  A viral respiratory infection is an illness that affects parts of the body that are used for breathing.  Examples of this illness include a cold, the flu, and respiratory syncytial virus (RSV) infection.  The infection can  cause a runny nose, cough, sneezing, sore throat, and fever.  Follow what your doctor tells you about taking medicines, drinking lots of fluid, washing your hands, resting at home, and avoiding people who are sick. This information is not intended to replace advice given to you by your health care provider. Make sure you discuss any questions you have with your health care provider. Document Released: 07/10/2008 Document Revised: 08/05/2018 Document Reviewed: 09/07/2017 Elsevier Patient  Education  2020 [employer].

## 2019-07-12 ENCOUNTER — Ambulatory Visit (INDEPENDENT_AMBULATORY_CARE_PROVIDER_SITE_OTHER): Payer: Managed Care, Other (non HMO) | Admitting: Family Medicine

## 2019-07-12 ENCOUNTER — Other Ambulatory Visit: Payer: Self-pay

## 2019-07-12 ENCOUNTER — Encounter: Payer: Self-pay | Admitting: Family Medicine

## 2019-07-12 VITALS — Temp 97.7°F | Ht 63.0 in | Wt 158.0 lb

## 2019-07-12 DIAGNOSIS — G43019 Migraine without aura, intractable, without status migrainosus: Secondary | ICD-10-CM | POA: Diagnosis not present

## 2019-07-12 DIAGNOSIS — F321 Major depressive disorder, single episode, moderate: Secondary | ICD-10-CM

## 2019-07-12 MED ORDER — DULOXETINE HCL 30 MG PO CPEP: 30.0000 mg | ORAL_CAPSULE | Freq: Every day | ORAL | 0 refills | Status: DC

## 2019-07-12 NOTE — Progress Notes (Signed)
Name: [name] [name]   MRN: [id number]    DOB: 01-01-1971   Date:07/12/2019       Progress Note  Subjective  Chief Complaint  Chief Complaint  Patient presents with  . Form Completion    fmla    I connected with  Sylvie Mifsud Mapp  on 07/12/19 at 11:40 AM EST by a video enabled telemedicine application and verified that I am speaking with the correct person using two identifiers.  I discussed the limitations of evaluation and management by telemedicine and the availability of in person appointments. The patient expressed understanding and agreed to proceed. Staff also discussed with the patient that there may be a patient responsible charge related to this service. Patient Location: at home  Provider Location: [facility]   HPI  Headaches: she developed daily headaches 05/18/2019. She had a remote history of migraines but no episodes since 2014. Pain is described as pressure and aching. She had eye exam that showed possible increase in intracranial pressure,had MRI brain,  seen by neurologist Dr. Sherryll Burger and spinal tap showed normal pressure ( done 07/01/2019 ) on the 24 th she had to go to [facility] because headache got worse and she developed vomiting. She is now taking Elavil, topamax. She continues to have daily headache, difficulty concentration, has photophobia. Dr. Sherryll Burger has excused her from work until 08/15/2019 and we will fill out FMLA today  Grieving: lost multiple family members over the past 12 months, first sister , mother and her cousin, she is still  struggling, she feels that her headache could have escalated because of it. Now she is also concerned about her health. She is seeing a therapist but not taking medication for depression at this time, but willing to try it .  Patient Active Problem List   Diagnosis Date Noted  . Perennial allergic rhinitis with seasonal variation 04/19/2019  . Asthma, mild intermittent 07/19/2018  . Cervical high risk HPV (human  papillomavirus) test positive 02/27/2017  . Post-traumatic osteoarthritis of right foot 09/08/2016  . Endometriosis of pelvis 01/05/2013    Past Surgical History:  Procedure Laterality Date  . ANKLE SURGERY    . ANKLE SURGERY Right 06/11/2017  . APPENDECTOMY  2008  . CESAREAN SECTION  1995  . CHOLECYSTECTOMY  2000  . LAPAROSCOPIC ENDOMETRIOSIS FULGURATION    . LAPAROSCOPY     for endometriosis  . spinal tap    . WISDOM TOOTH EXTRACTION     x2    Family History  Problem Relation Age of Onset  . COPD Mother   . Emphysema Mother   . Cancer Father        unknown  . Cancer Paternal Aunt        ovarian    Social History   Socioeconomic History  . Marital status: Single    Spouse name: Not on file  . Number of children: 1  . Years of education: Not on file  . Highest education level: Bachelor's degree (e.g., BA, AB, BS)  Occupational History  . Not on file  Social Needs  . Financial resource strain: Not hard at all  . Food insecurity    Worry: Never true    Inability: Never true  . Transportation needs    Medical: No    Non-medical: No  Tobacco Use  . Smoking status: Never Smoker  . Smokeless tobacco: Never Used  Substance and Sexual Activity  . Alcohol use: No    Alcohol/week: 0.0 standard  drinks  . Drug use: No  . Sexual activity: Yes    Birth control/protection: Inserts    Comment: Nuva ring  Lifestyle  . Physical activity    Days per week: 0 days    Minutes per session: 0 min  . Stress: Only a little  Relationships  . Social connections    Talks on phone: More than three times a week    Gets together: More than three times a week    Attends religious service: More than 4 times per year    Active member of club or organization: No    Attends meetings of clubs or organizations: Never    Relationship status: Never married  . Intimate partner violence    Fear of current or ex partner: No    Emotionally abused: No    Physically abused: No    Forced  sexual activity: No  Other Topics Concern  . Not on file  Social History Narrative   Raised by her paternal aunt. She died in March 2020 from PE , complication of knee surgery      Current Outpatient Medications:  .  albuterol (VENTOLIN HFA) 108 (90 Base) MCG/ACT inhaler, Inhale 2 puffs into the lungs every 6 (six) hours as needed for wheezing or shortness of breath., Disp: 18 g, Rfl: 1 .  Azelastine HCl 0.15 % SOLN, Place 1 spray into the nose daily., Disp: 90 mL, Rfl: 1 .  benzonatate (TESSALON) 100 MG capsule, Take 1 capsule (100 mg total) by mouth 3 (three) times daily as needed for cough., Disp: 30 capsule, Rfl: 0 .  cyclobenzaprine (FLEXERIL) 10 MG tablet, Take 1 tablet (10 mg total) by mouth 3 (three) times daily as needed for muscle spasms., Disp: 60 tablet, Rfl: 0 .  desonide (DESOWEN) 0.05 % cream, APPLY ON FACE TOPICALLY TWICE DAILY, Disp: 30 g, Rfl: 0 .  doxycycline (VIBRA-TABS) 100 MG tablet, Take 1 tablet (100 mg total) by mouth 2 (two) times daily for 7 days., Disp: 14 tablet, Rfl: 0 .  fluticasone (FLONASE) 50 MCG/ACT nasal spray, Place 2 sprays into both nostrils daily., Disp: 48 g, Rfl: 1 .  fluticasone furoate-vilanterol (BREO ELLIPTA) 100-25 MCG/INH AEPB, Inhale 1 puff by mouth once daily, Disp: 60 each, Rfl: 5 .  ibuprofen (ADVIL) 800 MG tablet, Take 1 tablet (800 mg total) by mouth every 8 (eight) hours as needed., Disp: 60 tablet, Rfl: 1 .  levocetirizine (XYZAL) 5 MG tablet, Take 1 tablet (5 mg total) by mouth daily as needed for allergies. (Patient taking differently: Take 5 mg by mouth at bedtime. ), Disp: 30 tablet, Rfl: 11 .  loratadine (CLARITIN) 10 MG tablet, Take 1 tablet (10 mg total) by mouth every morning., Disp: 90 tablet, Rfl: 1 .  montelukast (SINGULAIR) 10 MG tablet, Take 1 tablet (10 mg total) by mouth daily., Disp: 90 tablet, Rfl: 1 .  nortriptyline (PAMELOR) 25 MG capsule, Take by mouth., Disp: , Rfl:  .  NUVARING 0.12-0.015 MG/24HR vaginal ring, INSERT  ONE RING VAGINALLY EVERY 28 DAYS. USE IN CONTINUOUS FASHION FOR ENDOMETRIOSIS., Disp: 3 each, Rfl: 12 .  pantoprazole (PROTONIX) 40 MG tablet, Take 1 tablet (40 mg total) by mouth daily., Disp: 90 tablet, Rfl: 1 .  predniSONE (DELTASONE) 10 MG tablet, Take 60mg  day 1, 50mg  day 2, 40mg  day 3, 30mg  day 4, 20mg  day 5, 10mg  day 6, then stop, Disp: , Rfl:  .  promethazine (PHENERGAN) 25 MG tablet, Take by mouth., Disp: ,  Rfl:  .  Spacer/Aero-Holding Chambers DEVI, 1 each by Does not apply route daily., Disp: 1 each, Rfl: 0 .  SUMAtriptan (IMITREX) 100 MG tablet, Take 100 mg by mouth 2 (two) times daily as needed., Disp: , Rfl:  .  triamcinolone cream (KENALOG) 0.1 %, Apply topically 2 (two) times daily., Disp: 453.6 g, Rfl: 0  Allergies  Allergen Reactions  . Symbicort [Budesonide-Formoterol Fumarate] Itching  . Mosquito (Culex Pipiens) Allergy Skin Test Swelling  . Mosquito (Diagnostic) Swelling    I personally reviewed active problem list, medication list, allergies, family history, social history, health maintenance with the patient/caregiver today.   ROS  Ten systems reviewed and is negative except as mentioned in HPI   Objective  Virtual encounter, vitals not obtained.  Body mass index is 27.99 kg/m.  Physical Exam  Awake, alert and oriented  PHQ2/9: Depression screen [facility] 2/9 07/12/2019 07/11/2019 06/06/2019 05/31/2019 04/19/2019  Decreased Interest 2 1 0 0 0  Down, Depressed, Hopeless 3 1 0 - 0  PHQ - 2 Score 5 2 0 0 0  Altered sleeping 3 0 0 0 0  Tired, decreased energy 3 0 0 0 0  Change in appetite 0 0 0 0 0  Feeling bad or failure about yourself  0 0 0 0 0  Trouble concentrating 1 0 0 0 0  Moving slowly or fidgety/restless 0 0 0 0 0  Suicidal thoughts 0 0 0 0 0  PHQ-9 Score 12 2 0 0 0  Difficult doing work/chores Somewhat difficult Not difficult at all - Not difficult at all Not difficult at all  Some recent data might be hidden   PHQ-2/9 Result is positive.     Fall Risk: Fall Risk  07/12/2019 07/11/2019 05/31/2019 04/19/2019 02/28/2019  Falls in the past year? 0 0 0 0 0  Number falls in past yr: 0 0 0 0 0  Injury with Fall? 0 0 0 0 0  Follow up - - - - Falls evaluation completed     Assessment & Plan  1. Migraine without aura, intractable  Continue follow up with Dr. Manuella Ghazi , and medications   2. Current moderate episode of major depressive disorder without prior episode ([place])  Discussed possible side effects of medication  - DULoxetine (CYMBALTA) 30 MG capsule; Take 1 capsule (30 mg total) by mouth daily.  Dispense: 30 capsule; Refill: 0  I discussed the assessment and treatment plan with the patient. The patient was provided an opportunity to ask questions and all were answered. The patient agreed with the plan and demonstrated an understanding of the instructions.  The patient was advised to call back or seek an in-person evaluation if the symptoms worsen or if the condition fails to improve as anticipated.  I provided 25  minutes of non-face-to-face time during this encounter.

## 2019-07-19 ENCOUNTER — Encounter: Payer: Self-pay | Admitting: Family Medicine

## 2019-07-19 ENCOUNTER — Telehealth: Payer: Self-pay | Admitting: *Deleted

## 2019-07-19 MED ORDER — FLUCONAZOLE 150 MG PO TABS: 150.0000 mg | ORAL_TABLET | Freq: Once | ORAL | 1 refills | Status: AC

## 2019-07-19 NOTE — Telephone Encounter (Signed)
-----   Message from Allena Earing, NT sent at 07/19/2019  9:41 AM EST ----- Regarding: diflucan rx request Patient is on antibiotics, and has gotten a yeast infection, can you please send if diflucan [facility] on [address]

## 2019-07-21 ENCOUNTER — Encounter: Payer: Self-pay | Admitting: Family Medicine

## 2019-07-21 DIAGNOSIS — H47239 Glaucomatous optic atrophy, unspecified eye: Secondary | ICD-10-CM | POA: Insufficient documentation

## 2019-07-21 DIAGNOSIS — G43909 Migraine, unspecified, not intractable, without status migrainosus: Secondary | ICD-10-CM | POA: Insufficient documentation

## 2019-07-21 DIAGNOSIS — F4321 Adjustment disorder with depressed mood: Secondary | ICD-10-CM | POA: Insufficient documentation

## 2019-07-25 ENCOUNTER — Telehealth: Payer: Self-pay

## 2019-07-25 NOTE — Telephone Encounter (Signed)
Copied from [place] [id number]. Topic: General - Inquiry >> Jul 25, 2019  8:59 AM Reyne Dumas L wrote: Reason for CRM:  Pt states that she needs to provider fax number for disability paperwork to be returned to. Fax number is:  [phone]

## 2019-07-27 ENCOUNTER — Encounter: Payer: Self-pay | Admitting: Family Medicine

## 2019-09-24 ENCOUNTER — Other Ambulatory Visit: Payer: Self-pay | Admitting: Family Medicine

## 2019-09-24 DIAGNOSIS — F321 Major depressive disorder, single episode, moderate: Secondary | ICD-10-CM

## 2019-09-24 NOTE — Telephone Encounter (Signed)
Requested Prescriptions  Pending Prescriptions Disp Refills  . DULoxetine (CYMBALTA) 30 MG capsule [Pharmacy Med Name: DULOXETINE DR 30MG  CAPSULES] 30 capsule 0    Sig: TAKE 1 CAPSULE(30 MG) BY MOUTH DAILY     Psychiatry: Antidepressants - SNRI Failed - 09/24/2019  7:33 AM      Failed - Last BP in normal range    BP Readings from Last 1 Encounters:  07/05/19 (!) 144/72         Passed - Valid encounter within last 6 months    Recent Outpatient Visits          2 months ago Migraine without aura, intractable   [facility], [place]   2 months ago Upper respiratory tract infection, unspecified type   [facility], PA-C   3 months ago New onset headache   [facility], MD   3 months ago New onset headache   [facility] [place] [place], [place]   5 months ago Gastroesophageal reflux disease without esophagitis   [facility], MD

## 2019-09-27 ENCOUNTER — Ambulatory Visit: Payer: Managed Care, Other (non HMO) | Admitting: Student in an Organized Health Care Education/Training Program

## 2019-10-07 ENCOUNTER — Encounter: Payer: Self-pay | Admitting: Family Medicine

## 2019-10-17 ENCOUNTER — Encounter: Payer: Self-pay | Admitting: Family Medicine

## 2019-10-18 ENCOUNTER — Ambulatory Visit: Payer: Managed Care, Other (non HMO) | Admitting: Internal Medicine

## 2019-11-14 ENCOUNTER — Encounter: Payer: Self-pay | Admitting: Family Medicine

## 2019-11-15 ENCOUNTER — Other Ambulatory Visit: Payer: Self-pay

## 2019-11-15 MED ORDER — FLUCONAZOLE 150 MG PO TABS: 150.0000 mg | ORAL_TABLET | Freq: Once | ORAL | 1 refills | Status: AC

## 2019-11-15 NOTE — Telephone Encounter (Signed)
Received call from patient she is requesting a diflucan be called into her pharmacy.

## 2019-11-29 ENCOUNTER — Encounter: Payer: Self-pay | Admitting: Family Medicine

## 2019-11-29 ENCOUNTER — Other Ambulatory Visit: Payer: Self-pay

## 2019-11-29 ENCOUNTER — Ambulatory Visit: Payer: Managed Care, Other (non HMO) | Admitting: Family Medicine

## 2019-11-29 VITALS — BP 130/82 | HR 103 | Temp 97.7°F | Resp 16 | Ht 63.0 in | Wt 155.5 lb

## 2019-11-29 DIAGNOSIS — L309 Dermatitis, unspecified: Secondary | ICD-10-CM | POA: Diagnosis not present

## 2019-11-29 DIAGNOSIS — L304 Erythema intertrigo: Secondary | ICD-10-CM

## 2019-11-29 DIAGNOSIS — D72829 Elevated white blood cell count, unspecified: Secondary | ICD-10-CM | POA: Diagnosis not present

## 2019-11-29 MED ORDER — DESONIDE 0.05 % EX CREA: TOPICAL_CREAM | Freq: Two times a day (BID) | CUTANEOUS | 0 refills | Status: AC

## 2019-11-29 NOTE — Progress Notes (Signed)
Name: [name] [name]   MRN: [id number]    DOB: February 14, 1971   Date:11/29/2019       Progress Note  Subjective  Chief Complaint  Chief Complaint  Patient presents with  . Rash    She has rash on her face, neck and underneath her breast x 2 months that comes and goes. She has been treating rash with Triamcinolone Cream and Gold Bond Powder. She has had rash underneath her breast before and it was treated with Nystatin cream.    HPI  She saw Dermatologist two weeks ago and was given [place] but she states it burns her skin, dermatologist told that it will improve with time. She had her brows done last Thursday and noticed bumps on her forehead, some stinging.   Intertrigo: under breast and between breast, not present when she saw dermatologist, rash in recurrent, itchy and sometimes it burns  Patient Active Problem List   Diagnosis Date Noted  . Migraine syndrome 07/21/2019  . 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 Grieving 07/21/2019  . Perennial allergic rhinitis with seasonal variation 04/19/2019  . Asthma, mild intermittent 07/19/2018  . 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 Post-traumatic osteoarthritis of right foot 09/08/2016  . Endometriosis of pelvis 01/05/2013    Past Surgical History:  Procedure Laterality Date  . ANKLE SURGERY    . ANKLE SURGERY Right 06/11/2017  . APPENDECTOMY  2008  . CESAREAN SECTION  1995  . CHOLECYSTECTOMY  2000  . LAPAROSCOPIC ENDOMETRIOSIS FULGURATION    . LAPAROSCOPY     for endometriosis  . spinal tap    . WISDOM TOOTH EXTRACTION     x2    Family History  Problem Relation Age of Onset  . COPD Mother   . Emphysema Mother   . Cancer Father        unknown  . Cancer Paternal Aunt        ovarian    Social History   Tobacco Use  . Smoking status: Never Smoker  . Smokeless tobacco: Never Used  Substance Use Topics  . Alcohol use: No    Alcohol/week: 0.0 standard drinks      Current Outpatient Medications:  .  albuterol (VENTOLIN HFA) 108 (90 Base) MCG/ACT inhaler, Inhale 2 puffs into the lungs every 6 (six) hours as needed for wheezing or shortness of breath., Disp: 18 g, Rfl: 1 .  Azelastine HCl 0.15 % SOLN, Place 1 spray into the nose daily., Disp: 90 mL, Rfl: 1 .  cyclobenzaprine (FLEXERIL) 10 MG tablet, Take 1 tablet (10 mg total) by mouth 3 (three) times daily as needed for muscle spasms., Disp: 60 tablet, Rfl: 0 .  fluticasone (FLONASE) 50 MCG/ACT nasal spray, Place 2 sprays into both nostrils daily., Disp: 48 g, Rfl: 1 .  fluticasone furoate-vilanterol (BREO ELLIPTA) 100-25 MCG/INH AEPB, Inhale 1 puff by mouth once daily, Disp: 60 each, Rfl: 5 .  ibuprofen (ADVIL) 800 MG tablet, Take 1 tablet (800 mg total) by mouth every 8 (eight) hours as needed., Disp: 60 tablet, Rfl: 1 .  loratadine (CLARITIN) 10 MG tablet, Take 1 tablet (10 mg total) by mouth every morning., Disp: 90 tablet, Rfl: 1 .  montelukast (SINGULAIR) 10 MG tablet, Take 1 tablet (10 mg total) by mouth daily., Disp: 90 tablet, Rfl: 1 .  nortriptyline (PAMELOR) 25 MG capsule, Take by mouth., Disp: , Rfl:  .  NUVARING 0.12-0.015 MG/24HR vaginal ring, INSERT ONE RING VAGINALLY  EVERY 28 DAYS. USE IN CONTINUOUS FASHION FOR ENDOMETRIOSIS., Disp: 3 each, Rfl: 12 .  Spacer/Aero-Holding Chambers DEVI, 1 each by Does not apply route daily., Disp: 1 each, Rfl: 0 .  triamcinolone cream (KENALOG) 0.1 %, Apply topically 2 (two) times daily., Disp: 453.6 g, Rfl: 0 .  desonide (DESOWEN) 0.05 % cream, Apply topically 2 (two) times daily., Disp: 60 g, Rfl: 0 .  EUCRISA 2 % OINT, APPLY TO THE AFFECTED AREAS TWICE DAILY AS NEEDED UNTIL SMOOTH CLEAR, Disp: , Rfl:  .  pantoprazole (PROTONIX) 40 MG tablet, Take 1 tablet (40 mg total) by mouth daily., Disp: 90 tablet, Rfl: 1 .  SUMAtriptan (IMITREX) 100 MG tablet, Take 100 mg by mouth 2 (two) times daily as needed., Disp: , Rfl:   Allergies  Allergen Reactions  .  Symbicort [Budesonide-Formoterol Fumarate] Itching  . Mosquito (Culex Pipiens) Allergy Skin Test Swelling  . Mosquito (Diagnostic) Swelling    I personally reviewed active problem list, medication list, allergies, family history, social history, health maintenance with the patient/caregiver today.   ROS  Constitutional: Negative for fever or weight change.  Respiratory: Negative for cough and shortness of breath.   Cardiovascular: Negative for chest pain or palpitations.  Gastrointestinal: Negative for abdominal pain, no bowel changes.  Musculoskeletal: Negative for gait problem or joint swelling.  Skin: positive for rash.  Neurological: Negative for dizziness or headache.  No other specific complaints in a complete review of systems (except as listed in HPI above).  Objective  Vitals:   11/29/19 1452  BP: 130/82  Pulse: (!) 103  Resp: 16  Temp: 97.7 F (36.5 C)  TempSrc: Temporal  SpO2: 98%  Weight: 155 lb 8 oz (70.5 kg)  Height: 5\' 3"  (1.6 m)    Body mass index is 27.55 kg/m.  Physical Exam  Constitutional: Patient appears well-developed and well-nourished. Overweight.  No distress.  HEENT: head atraumatic, normocephalic, pupils equal and reactive to light Cardiovascular: Normal rate, regular rhythm and normal heart sounds.  No murmur heard. No BLE edema. Pulmonary/Chest: Effort normal and breath sounds normal. No respiratory distress. Abdominal: Soft.  There is no tenderness. Skin: some erythematous papules on the area that she had brows threaded last week. Also has some erythema under breast, well demarcated rash  Psychiatric: Patient has a normal mood and affect. behavior is normal. Judgment and thought content normal.  PHQ2/9: Depression screen [facility] 2/9 11/29/2019 07/12/2019 07/11/2019 06/06/2019 05/31/2019  Decreased Interest 0 2 1 0 0  Down, Depressed, Hopeless 0 3 1 0 -  PHQ - 2 Score 0 5 2 0 0  Altered sleeping 0 3 0 0 0  Tired, decreased energy 0 3 0 0 0   Change in appetite 0 0 0 0 0  Feeling bad or failure about yourself  0 0 0 0 0  Trouble concentrating 0 1 0 0 0  Moving slowly or fidgety/restless 0 0 0 0 0  Suicidal thoughts 0 0 0 0 0  PHQ-9 Score 0 12 2 0 0  Difficult doing work/chores - Somewhat difficult Not difficult at all - Not difficult at all  Some recent data might be hidden    phq 9 is negative   Fall Risk: Fall Risk  11/29/2019 07/12/2019 07/11/2019 05/31/2019 04/19/2019  Falls in the past year? 0 0 0 0 0  Number falls in past yr: 0 0 0 0 0  Injury with Fall? 0 0 0 0 0  Follow up - - - - -  Functional Status Survey: Is the patient deaf or have difficulty hearing?: No Does the patient have difficulty seeing, even when wearing glasses/contacts?: No Does the patient have difficulty concentrating, remembering, or making decisions?: No Does the patient have difficulty walking or climbing stairs?: No Does the patient have difficulty dressing or bathing?: No Does the patient have difficulty doing errands alone such as visiting a doctor's office or shopping?: No    Assessment & Plan  1. Eczema, unspecified type  - desonide (DESOWEN) 0.05 % cream; Apply topically 2 (two) times daily.  Dispense: 60 g; Refill: 0  2. Intertrigo  Discussed otc medication, hydrocortisone 10 and clotrimazole

## 2019-11-29 NOTE — Patient Instructions (Signed)
Mix desonide and Eucrisa for your face/neck  Back of neck and other parts triamcinolone Use otc cortizone 10 mixed with Monistat ( clotrimazole cream) between breast or under breast

## 2019-11-30 LAB — CBC
HCT: 41.1 % (ref 35.0–45.0)
Hemoglobin: 13.7 g/dL (ref 11.7–15.5)
MCH: 28.9 pg (ref 27.0–33.0)
MCHC: 33.3 g/dL (ref 32.0–36.0)
MCV: 86.7 fL (ref 80.0–100.0)
MPV: 11.4 fL (ref 7.5–12.5)
Platelets: 332 10*3/uL (ref 140–400)
RBC: 4.74 10*6/uL (ref 3.80–5.10)
RDW: 13.1 % (ref 11.0–15.0)
WBC: 8.1 10*3/uL (ref 3.8–10.8)

## 2019-12-29 ENCOUNTER — Encounter: Payer: Self-pay | Admitting: Family Medicine

## 2019-12-29 ENCOUNTER — Ambulatory Visit (INDEPENDENT_AMBULATORY_CARE_PROVIDER_SITE_OTHER): Payer: Managed Care, Other (non HMO) | Admitting: Family Medicine

## 2019-12-29 ENCOUNTER — Other Ambulatory Visit: Payer: Self-pay

## 2019-12-29 VITALS — Temp 98.3°F

## 2019-12-29 DIAGNOSIS — J4521 Mild intermittent asthma with (acute) exacerbation: Secondary | ICD-10-CM

## 2019-12-29 DIAGNOSIS — R058 Other specified cough: Secondary | ICD-10-CM

## 2019-12-29 MED ORDER — AZITHROMYCIN 250 MG PO TABS: ORAL_TABLET | ORAL | 0 refills | Status: DC

## 2019-12-29 MED ORDER — PREDNISONE 10 MG PO TABS: 10.0000 mg | ORAL_TABLET | Freq: Every day | ORAL | 0 refills | Status: DC

## 2019-12-29 MED ORDER — BENZONATATE 100 MG PO CAPS: 100.0000 mg | ORAL_CAPSULE | Freq: Two times a day (BID) | ORAL | 0 refills | Status: DC | PRN

## 2019-12-29 NOTE — Progress Notes (Signed)
Name: [name] [name]   MRN: [id number]    DOB: 1971/01/27   Date:12/29/2019       Progress Note  Subjective  Chief Complaint  Chief Complaint  Patient presents with  . Cough    Symptoms started Sunday. She has a productive cough with yellow mucous that she noticed last night. She has been taking Severe Tylenol Cold and Flu.   . Scrathy Throat  . Nasal Congestion    I connected with  Jamese Trauger Mapp on 12/29/19 at  9:00 AM EDT by telephone and verified that I am speaking with the correct person using two identifiers.  I discussed the limitations, risks, security and privacy concerns of performing an evaluation and management service by telephone and the availability of in person appointments. Staff also discussed with the patient that there may be a patient responsible charge related to this service. Patient Location: parking lot in front of [place]  Provider Location: [facility]   HPI  Productive cough: she states she woke up 4 days ago with a frontal headache and right nostril was closed . She used a dose of Flonase. She states she continues to feel congested despite using nasal steroid. She also has scratchy throat, a productive cough that is yellow in color , she is getting progressively worse she now has chest congestion, mild wheezing . Denies fever, chills or change in appetite . She had both doses of Pfizer  COVID-19 vaccine back in March 2021 . She denies sick contacts. She has asthma, she states she resumed Breo daily when symptoms started . She has not used rescue inhaler . She has not taken antibiotics in about one year.    Patient Active Problem List   Diagnosis Date Noted  . Migraine syndrome 07/21/2019  . 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 Grieving 07/21/2019  . Perennial allergic rhinitis with seasonal variation 04/19/2019  . Asthma, mild intermittent 07/19/2018  . 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  Post-traumatic osteoarthritis of right foot 09/08/2016  . Endometriosis of pelvis 01/05/2013    Past Surgical History:  Procedure Laterality Date  . ANKLE SURGERY    . ANKLE SURGERY Right 06/11/2017  . APPENDECTOMY  2008  . CESAREAN SECTION  1995  . CHOLECYSTECTOMY  2000  . LAPAROSCOPIC ENDOMETRIOSIS FULGURATION    . LAPAROSCOPY     for endometriosis  . spinal tap    . WISDOM TOOTH EXTRACTION     x2    Family History  Problem Relation Age of Onset  . COPD Mother   . Emphysema Mother   . Cancer Father        unknown  . Cancer Paternal Aunt        ovarian     Current Outpatient Medications:  .  albuterol (VENTOLIN HFA) 108 (90 Base) MCG/ACT inhaler, Inhale 2 puffs into the lungs every 6 (six) hours as needed for wheezing or shortness of breath., Disp: 18 g, Rfl: 1 .  Azelastine HCl 0.15 % SOLN, Place 1 spray into the nose daily., Disp: 90 mL, Rfl: 1 .  cyclobenzaprine (FLEXERIL) 10 MG tablet, Take 1 tablet (10 mg total) by mouth 3 (three) times daily as needed for muscle spasms., Disp: 60 tablet, Rfl: 0 .  desonide (DESOWEN) 0.05 % cream, Apply topically 2 (two) times daily., Disp: 60 g, Rfl: 0 .  EUCRISA 2 % OINT, APPLY TO THE AFFECTED AREAS TWICE DAILY AS NEEDED UNTIL  SMOOTH CLEAR, Disp: , Rfl:  .  fluticasone (FLONASE) 50 MCG/ACT nasal spray, Place 2 sprays into both nostrils daily., Disp: 48 g, Rfl: 1 .  fluticasone furoate-vilanterol (BREO ELLIPTA) 100-25 MCG/INH AEPB, Inhale 1 puff by mouth once daily, Disp: 60 each, Rfl: 5 .  ibuprofen (ADVIL) 800 MG tablet, Take 1 tablet (800 mg total) by mouth every 8 (eight) hours as needed., Disp: 60 tablet, Rfl: 1 .  loratadine (CLARITIN) 10 MG tablet, Take 1 tablet (10 mg total) by mouth every morning., Disp: 90 tablet, Rfl: 1 .  montelukast (SINGULAIR) 10 MG tablet, Take 1 tablet (10 mg total) by mouth daily., Disp: 90 tablet, Rfl: 1 .  nortriptyline (PAMELOR) 25 MG capsule, Take by mouth., Disp: , Rfl:  .  NUVARING 0.12-0.015  MG/24HR vaginal ring, INSERT ONE RING VAGINALLY EVERY 28 DAYS. USE IN CONTINUOUS FASHION FOR ENDOMETRIOSIS., Disp: 3 each, Rfl: 12 .  Spacer/Aero-Holding Chambers DEVI, 1 each by Does not apply route daily., Disp: 1 each, Rfl: 0 .  SUMAtriptan (IMITREX) 100 MG tablet, Take 100 mg by mouth 2 (two) times daily as needed., Disp: , Rfl:  .  triamcinolone cream (KENALOG) 0.1 %, Apply topically 2 (two) times daily., Disp: 453.6 g, Rfl: 0 .  pantoprazole (PROTONIX) 40 MG tablet, Take 1 tablet (40 mg total) by mouth daily., Disp: 90 tablet, Rfl: 1  Allergies  Allergen Reactions  . Symbicort [Budesonide-Formoterol Fumarate] Itching  . Mosquito (Culex Pipiens) Allergy Skin Test Swelling  . Mosquito (Diagnostic) Swelling    I personally reviewed active problem list, medication list, allergies, family history, social history, health maintenance with the patient/caregiver today.   ROS  Ten systems reviewed and is negative except as mentioned in HPI   Objective  Virtual encounter, vitals not obtained.  There is no height or weight on file to calculate BMI.  Physical Exam  Awake, alert and oriented   PHQ2/9: Depression screen [facility] 2/9 12/29/2019 11/29/2019 07/12/2019 07/11/2019 06/06/2019  Decreased Interest 0 0 2 1 0  Down, Depressed, Hopeless 0 0 3 1 0  PHQ - 2 Score 0 0 5 2 0  Altered sleeping 0 0 3 0 0  Tired, decreased energy 0 0 3 0 0  Change in appetite 0 0 0 0 0  Feeling bad or failure about yourself  0 0 0 0 0  Trouble concentrating 0 0 1 0 0  Moving slowly or fidgety/restless 0 0 0 0 0  Suicidal thoughts 0 0 0 0 0  PHQ-9 Score 0 0 12 2 0  Difficult doing work/chores - - Somewhat difficult Not difficult at all -  Some recent data might be hidden   PHQ-2/9 Result is negative.    Fall Risk: Fall Risk  12/29/2019 11/29/2019 07/12/2019 07/11/2019 05/31/2019  Falls in the past year? 0 0 0 0 0  Number falls in past yr: 0 0 0 0 0  Injury with Fall? 0 0 0 0 0  Follow up - - - - -      Assessment & Plan  1. Productive cough  - azithromycin (ZITHROMAX) 250 MG tablet; Take as directed  Dispense: 6 tablet; Refill: 0 - predniSONE (DELTASONE) 10 MG tablet; Take 1 tablet (10 mg total) by mouth daily with breakfast.  Dispense: 10 tablet; Refill: 0 - benzonatate (TESSALON) 100 MG capsule; Take 1-2 capsules (100-200 mg total) by mouth 2 (two) times daily as needed.  Dispense: 40 capsule; Refill: 0  Offered CXR but we decided to  try empirical treatment first   Discussed red flags and when to contact us or go to [facility]  2. Mild intermittent asthma with acute exacerbation  - predniSONE (DELTASONE) 10 MG tablet; Take 1 tablet (10 mg total) by mouth daily with breakfast.  Dispense: 10 tablet; Refill: 0 Take with food   I discussed the assessment and treatment plan with the patient. The patient was provided an opportunity to ask questions and all were answered. The patient agreed with the plan and demonstrated an understanding of the instructions.   The patient was advised to call back or seek an in-person evaluation if the symptoms worsen or if the condition fails to improve as anticipated.  I provided 25  minutes of non-face-to-face time during this encounter.  Ruel Favors, MD

## 2020-02-09 ENCOUNTER — Ambulatory Visit (INDEPENDENT_AMBULATORY_CARE_PROVIDER_SITE_OTHER): Payer: Managed Care, Other (non HMO) | Admitting: Obstetrics & Gynecology

## 2020-02-09 ENCOUNTER — Other Ambulatory Visit: Payer: Self-pay

## 2020-02-09 ENCOUNTER — Other Ambulatory Visit (HOSPITAL_COMMUNITY)
Admission: RE | Admit: 2020-02-09 | Discharge: 2020-02-09 | Disposition: A | Discharge status: Home / Self Care | Payer: Managed Care, Other (non HMO) | Source: Ambulatory Visit | Attending: Obstetrics & Gynecology | Admitting: Obstetrics & Gynecology

## 2020-02-09 ENCOUNTER — Encounter: Payer: Self-pay | Admitting: Obstetrics & Gynecology

## 2020-02-09 VITALS — BP 135/83 | HR 72 | Wt 153.0 lb

## 2020-02-09 DIAGNOSIS — N941 Unspecified dyspareunia: Secondary | ICD-10-CM

## 2020-02-09 DIAGNOSIS — Z01411 Encounter for gynecological examination (general) (routine) with abnormal findings: Secondary | ICD-10-CM | POA: Diagnosis not present

## 2020-02-09 DIAGNOSIS — Z01419 Encounter for gynecological examination (general) (routine) without abnormal findings: Secondary | ICD-10-CM | POA: Diagnosis present

## 2020-02-09 DIAGNOSIS — Z1151 Encounter for screening for human papillomavirus (HPV): Secondary | ICD-10-CM | POA: Diagnosis not present

## 2020-02-09 DIAGNOSIS — R87810 Cervical high risk human papillomavirus (HPV) DNA test positive: Secondary | ICD-10-CM

## 2020-02-09 DIAGNOSIS — B3731 Acute candidiasis of vulva and vagina: Secondary | ICD-10-CM

## 2020-02-09 DIAGNOSIS — B373 Candidiasis of vulva and vagina: Secondary | ICD-10-CM

## 2020-02-09 DIAGNOSIS — R87610 Atypical squamous cells of undetermined significance on cytologic smear of cervix (ASC-US): Secondary | ICD-10-CM | POA: Insufficient documentation

## 2020-02-09 DIAGNOSIS — Z1231 Encounter for screening mammogram for malignant neoplasm of breast: Secondary | ICD-10-CM

## 2020-02-09 DIAGNOSIS — B372 Candidiasis of skin and nail: Secondary | ICD-10-CM | POA: Diagnosis not present

## 2020-02-09 MED ORDER — NYSTATIN 100000 UNIT/GM EX CREA: 1.0000 "application " | TOPICAL_CREAM | Freq: Two times a day (BID) | CUTANEOUS | 2 refills | Status: DC

## 2020-02-09 NOTE — Progress Notes (Signed)
GYNECOLOGY ANNUAL PREVENTATIVE CARE ENCOUNTER NOTE  History:     [name] [name] is a 49 y.o. G1P1 female here for a routine annual gynecologic exam.  Current complaints: some vulvar and vaginal irritation during intercourse during past three weeks.  Also has rash under breasts, had this before and was successfully treated with Nystatin cream.    Denies abnormal vaginal bleeding, discharge, pelvic pain, problems with intercourse or other gynecologic concerns.    Gynecologic History No LMP recorded. (Menstrual status: Other). Contraception: none Last Pap: 03/03/2019. Results were: normal with positive HPV Last mammogram: 04/13/2019. Results were: normal  Obstetric History OB History  Gravida Para Term Preterm AB Living  1 1       1   SAB TAB Ectopic Multiple Live Births          1    # Outcome Date GA Lbr Len/2nd Weight Sex Delivery Anes PTL Lv  1 Para 1995    M CS-LTranv   LIV    Past Medical History:  Diagnosis Date  . Allergy   . Arthritis   . Asthma   . Constipation   . Endometriosis    Uses Nuvaring continuously  . Headache   . Heart murmur   . Ulcer     Past Surgical History:  Procedure Laterality Date  . ANKLE SURGERY Right 06/11/2017  . APPENDECTOMY  2008  . CESAREAN SECTION  1995  . CHOLECYSTECTOMY  2000  . LAPAROSCOPIC ENDOMETRIOSIS FULGURATION    . spinal tap    . WISDOM TOOTH EXTRACTION     x2    Current Outpatient Medications on File Prior to Visit  Medication Sig Dispense Refill  . albuterol (VENTOLIN HFA) 108 (90 Base) MCG/ACT inhaler Inhale 2 puffs into the lungs every 6 (six) hours as needed for wheezing or shortness of breath. 18 g 1  . Azelastine HCl 0.15 % SOLN Place 1 spray into the nose daily. 90 mL 1  . azithromycin (ZITHROMAX) 250 MG tablet Take as directed 6 tablet 0  . benzonatate (TESSALON) 100 MG capsule Take 1-2 capsules (100-200 mg total) by mouth 2 (two) times daily as needed. 40 capsule 0  . cyclobenzaprine (FLEXERIL) 10 MG tablet  Take 1 tablet (10 mg total) by mouth 3 (three) times daily as needed for muscle spasms. 60 tablet 0  . desonide (DESOWEN) 0.05 % cream Apply topically 2 (two) times daily. 60 g 0  . EUCRISA 2 % OINT APPLY TO THE AFFECTED AREAS TWICE DAILY AS NEEDED UNTIL SMOOTH CLEAR    . fluticasone (FLONASE) 50 MCG/ACT nasal spray Place 2 sprays into both nostrils daily. 48 g 1  . fluticasone furoate-vilanterol (BREO ELLIPTA) 100-25 MCG/INH AEPB Inhale 1 puff by mouth once daily 60 each 5  . ibuprofen (ADVIL) 800 MG tablet Take 1 tablet (800 mg total) by mouth every 8 (eight) hours as needed. 60 tablet 1  . loratadine (CLARITIN) 10 MG tablet Take 1 tablet (10 mg total) by mouth every morning. 90 tablet 1  . montelukast (SINGULAIR) 10 MG tablet Take 1 tablet (10 mg total) by mouth daily. 90 tablet 1  . nortriptyline (PAMELOR) 25 MG capsule Take by mouth.    2009 NUVARING 0.12-0.015 MG/24HR vaginal ring INSERT ONE RING VAGINALLY EVERY 28 DAYS. USE IN CONTINUOUS FASHION FOR ENDOMETRIOSIS. 3 each 12  . pantoprazole (PROTONIX) 40 MG tablet Take 1 tablet (40 mg total) by mouth daily. 90 tablet 1  . predniSONE (DELTASONE) 10 MG tablet Take  1 tablet (10 mg total) by mouth daily with breakfast. 10 tablet 0  . Spacer/Aero-Holding Chambers DEVI 1 each by Does not apply route daily. 1 each 0  . SUMAtriptan (IMITREX) 100 MG tablet Take 100 mg by mouth 2 (two) times daily as needed.    . triamcinolone cream (KENALOG) 0.1 % Apply topically 2 (two) times daily. 453.6 g 0   No current facility-administered medications on file prior to visit.    Allergies  Allergen Reactions  . Symbicort [Budesonide-Formoterol Fumarate] Itching  . Mosquito (Culex Pipiens) Allergy Skin Test Swelling  . Mosquito (Diagnostic) Swelling    Social History:  reports that she has never smoked. She has never used smokeless tobacco. She reports that she does not drink alcohol and does not use drugs.  Family History  Problem Relation Age of Onset  .  COPD Mother   . Emphysema Mother   . Cancer Father        unknown  . Cancer Paternal Aunt        ovarian    The following portions of the patient's history were reviewed and updated as appropriate: allergies, current medications, past family history, past medical history, past social history, past surgical history and problem list.  Review of Systems Pertinent items noted in HPI and remainder of comprehensive ROS otherwise negative.  Physical Exam:  BP 135/83   Pulse 72   Wt 153 lb (69.4 kg)   BMI 27.10 kg/m  CONSTITUTIONAL: Well-developed, well-nourished female in no acute distress.  HENT:  Normocephalic, atraumatic, External right and left ear normal. Oropharynx is clear and moist EYES: Conjunctivae and EOM are normal. Pupils are equal, round, and reactive to light. No scleral icterus.  NECK: Normal range of motion, supple, no masses.  Normal thyroid.  MUSCULOSKELETAL: Normal range of motion. No tenderness.  No cyanosis, clubbing, or edema.  2+ distal pulses. NEUROLOGIC: Alert and oriented to person, place, and time. Normal reflexes, muscle tone coordination.  PSYCHIATRIC: Normal mood and affect. Normal behavior. Normal judgment and thought content. CARDIOVASCULAR: Normal heart rate noted, regular rhythm RESPIRATORY: Clear to auscultation bilaterally. Effort and breath sounds normal, no problems with respiration noted. BREASTS: Symmetric in size. Erythematous rash noted underneath both breasts concerning for yeast infection.  No masses, tenderness, nipple drainage, or lymphadenopathy bilaterally. Performed in the presence of a chaperone. ABDOMEN: Soft, no distention noted.  No tenderness, rebound or guarding.  PELVIC: Normal appearing external genitalia without lesions or inflammation and normal urethral meatus; normal appearing vaginal mucosa and cervix.  Scant white discharge noted, testing sample.  Pap smear obtained, some bleeding noted on pap.  Normal uterine size, no other  palpable masses, no uterine or adnexal tenderness.  Performed in the presence of a chaperone.   Assessment and Plan:    1. Pain in female genitalia on intercourse Likely secondary to inflammation or infection. Will follow up testing.  Advised to do trial of hydrocortisone cream externally to see if this helps.  - Cervicovaginal ancillary only( [place])  2. Yeast infection of the skin underneath breasts Nystatin cream prescribed. Will monitor response. - nystatin cream (MYCOSTATIN); Apply 1 application topically 2 (two) times daily. To affected area  Dispense: 30 g; Refill: 2  3. Breast cancer screening by mammogram Mammogram scheduled - MM 3D SCREEN BREAST BILATERAL; Future  4. 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 As per Dr. Andrey Farmer (GYN ONC), if this persists, can do colposcopy every 3-5  years. Last colposcopy was in 02/2018.  - Cytology - PAP( [place])  5. Well woman exam with routine gynecological exam - Cytology - PAP( [place]) - HIV Antibody (routine testing w rflx) - [facility] - Hep C Antibody - Cervicovaginal ancillary only( [place]) Will follow up results of pap smear and annual STI screen and manage accordingly. Routine preventative health maintenance measures emphasized. Please refer to After Visit Summary for other counseling recommendations.      Jaynie Collins, MD, FACOG Obstetrician & Gynecologist, [facility] for [employer], [facility]

## 2020-02-09 NOTE — Patient Instructions (Signed)
Dyspareunia, Female Dyspareunia is pain that is associated with sexual activity. This can affect any part of the genitals or lower abdomen. There are many possible causes of this condition. In some cases, diagnosing the cause of dyspareunia can be difficult. This condition can be mild, moderate, or severe. Depending on the cause, dyspareunia may get better with treatment, but may return (recur) over time. What are the causes?  The cause of this condition is not always known. However, problems that affect the vulva, vagina, uterus, and other organs may cause dyspareunia. Common causes of this condition include:  Vaginal dryness.  Giving birth.  Infection.  Skin changes or conditions.  Side effects of medicines.  Endometriosis. This is when tissue that is like the lining of the uterus grows on the outside of the uterus.  Psychological conditions. These include depression, anxiety, or traumatic experiences.  Allergic reaction. What increases the risk? The following factors may make you more likely to develop this condition:  History of physical or sexual trauma.  Some medicines.  No longer having a monthly period (menopause).  Having recently given birth.  Taking baths using soaps that have perfumes. These can cause irritation.  Douching. What are the signs or symptoms? The main symptom of this condition is pain in any part of your genitals or lower abdomen during or after sex. This may include:  Irritation, burning, or stinging sensations in your vulva.  Discomfort when your vulva or surrounding area is touched.  Aching and throbbing pain that may be constant.  Pain that gets worse when something is inserted into your vagina. How is this diagnosed? This condition may be diagnosed based on:  Your symptoms, including where and when your pain occurs.  Your medical history.  A physical exam. A pelvic exam will most likely be done.  Tests that include ultrasound,  blood tests, and tests that check the body for infection.  Imaging tests, such as X-ray, MRI, and CT scan. You may be referred to a health care provider who specializes in women's health (gynecologist). How is this treated? Treatment depends on the cause of your condition and your symptoms. In most cases, you may need to stop sexual activity until your symptoms go away or get better. Treatment may include:  Lubricants, ointments, and creams.  Physical therapy.  Massage therapy.  Hormonal therapy.  Medicines to: ? Prevent or fight infection. ? Relieve pain. ? Help numb the area. ? Treat depression (antidepressants).  Counseling, which may include sex therapy.  Surgery. Follow these instructions at home: Lifestyle  Wear cotton underwear.  Use water-based lubricants as needed during sex. Avoid oil-based lubricants.  Do not use any products that can cause irritation. This may include certain condoms, spermicides, lubricants, soaps, tampons, vaginal sprays, or douches.  Always practice safe sex. Use a condom to prevent sexually transmitted infections (STIs).  Talk freely with your partner about your condition. General instructions  Take or apply over-the-counter and prescription medicines only as told by your health care provider.  Urinate before you have sex.  Consider joining a support group.  Get the results of any tests you have done. Ask your health care provider, or the department that is doing the procedure, when your results will be ready.  Keep all follow-up visits as told by your health care provider. This is important. Contact a health care provider if:  You have vaginal bleeding after having sex.  You develop a lump at the opening of your vagina even if the  lump is painless.  You have: ? Abnormal discharge from your vagina. ? Vaginal dryness. ? Itchiness or irritation of your vulva or vagina. ? A new rash. ? Symptoms that get worse or do not improve  with treatment. ? A fever. ? Pain when you urinate. ? Blood in your urine. Get help right away if:  You have severe pain in your abdomen during or shortly after sex.  You pass out after sex. Summary  Dyspareunia is pain that is associated with sexual activity. This can affect any part of the genitals or lower abdomen.  There are many causes of this condition. Treatment depends on the cause and your symptoms. In most cases, you may need to stop sexual activity until your symptoms improve.  Take or apply over-the-counter and prescription medicines only as told by your health care provider.  Contact a health care provider if your symptoms get worse or do not improve with treatment.  Keep all follow-up visits as told by your health care provider. This is important. This information is not intended to replace advice given to you by your health care provider. Make sure you discuss any questions you have with your health care provider. Document Revised: 10/04/2018 Document Reviewed: 10/04/2018 Elsevier Patient Education  2020 Elsevier Inc. Preventive Care 49-3 Years Old, Female Preventive care refers to visits with your health care provider and lifestyle choices that can promote health and wellness. This includes:  A yearly physical exam. This may also be called an annual well check.  Regular dental visits and eye exams.  Immunizations.  Screening for certain conditions.  Healthy lifestyle choices, such as eating a healthy diet, getting regular exercise, not using drugs or products that contain nicotine and tobacco, and limiting alcohol use. What can I expect for my preventive care visit? Physical exam Your health care provider will check your:  Height and weight. This may be used to calculate body mass index (BMI), which tells if you are at a healthy weight.  Heart rate and blood pressure.  Skin for abnormal spots. Counseling Your health care provider may ask you questions  about your:  Alcohol, tobacco, and drug use.  Emotional well-being.  Home and relationship well-being.  Sexual activity.  Eating habits.  Work and work Statistician.  Method of birth control.  Menstrual cycle.  Pregnancy history. What immunizations do I need?  Influenza (flu) vaccine  This is recommended every year. Tetanus, diphtheria, and pertussis (Tdap) vaccine  You may need a Td booster every 10 years. Varicella (chickenpox) vaccine  You may need this if you have not been vaccinated. Zoster (shingles) vaccine  You may need this after age 48. Measles, mumps, and rubella (MMR) vaccine  You may need at least one dose of MMR if you were born in 1957 or later. You may also need a second dose. Pneumococcal conjugate (PCV13) vaccine  You may need this if you have certain conditions and were not previously vaccinated. Pneumococcal polysaccharide (PPSV23) vaccine  You may need one or two doses if you smoke cigarettes or if you have certain conditions. Meningococcal conjugate (MenACWY) vaccine  You may need this if you have certain conditions. Hepatitis A vaccine  You may need this if you have certain conditions or if you travel or work in places where you may be exposed to hepatitis A. Hepatitis B vaccine  You may need this if you have certain conditions or if you travel or work in places where you may be exposed to hepatitis  B. Haemophilus influenzae type b (Hib) vaccine  You may need this if you have certain conditions. Human papillomavirus (HPV) vaccine  If recommended by your health care provider, you may need three doses over 6 months. You may receive vaccines as individual doses or as more than one vaccine together in one shot (combination vaccines). Talk with your health care provider about the risks and benefits of combination vaccines. What tests do I need? Blood tests  Lipid and cholesterol levels. These may be checked every 5 years, or more  frequently if you are over 72 years old.  Hepatitis C test.  Hepatitis B test. Screening  Lung cancer screening. You may have this screening every year starting at age 15 if you have a 30-pack-year history of smoking and currently smoke or have quit within the past 15 years.  Colorectal cancer screening. All adults should have this screening starting at age 50 and continuing until age 19. Your health care provider may recommend screening at age 47 if you are at increased risk. You will have tests every 1-10 years, depending on your results and the type of screening test.  Diabetes screening. This is done by checking your blood sugar (glucose) after you have not eaten for a while (fasting). You may have this done every 1-3 years.  Mammogram. This may be done every 1-2 years. Talk with your health care provider about when you should start having regular mammograms. This may depend on whether you have a family history of breast cancer.  BRCA-related cancer screening. This may be done if you have a family history of breast, ovarian, tubal, or peritoneal cancers.  Pelvic exam and Pap test. This may be done every 3 years starting at age 24. Starting at age 24, this may be done every 5 years if you have a Pap test in combination with an HPV test. Other tests  Sexually transmitted disease (STD) testing.  Bone density scan. This is done to screen for osteoporosis. You may have this scan if you are at high risk for osteoporosis. Follow these instructions at home: Eating and drinking  Eat a diet that includes fresh fruits and vegetables, whole grains, lean protein, and low-fat dairy.  Take vitamin and mineral supplements as recommended by your health care provider.  Do not drink alcohol if: ? Your health care provider tells you not to drink. ? You are pregnant, may be pregnant, or are planning to become pregnant.  If you drink alcohol: ? Limit how much you have to 0-1 drink a day. ? Be aware  of how much alcohol is in your drink. In the [place] one drink equals one 12 oz bottle of beer (355 mL), one 5 oz glass of wine (148 mL), or one 1 oz glass of hard liquor (44 mL). Lifestyle  Take daily care of your teeth and gums.  Stay active. Exercise for at least 30 minutes on 5 or more days each week.  Do not use any products that contain nicotine or tobacco, such as cigarettes, e-cigarettes, and chewing tobacco. If you need help quitting, ask your health care provider.  If you are sexually active, practice safe sex. Use a condom or other form of birth control (contraception) in order to prevent pregnancy and STIs (sexually transmitted infections).  If told by your health care provider, take low-dose aspirin daily starting at age 64. What's next?  Visit your health care provider once a year for a well check visit.  Ask your health care  provider how often you should have your eyes and teeth checked.  Stay up to date on all vaccines. This information is not intended to replace advice given to you by your health care provider. Make sure you discuss any questions you have with your health care provider. Document Revised: 04/08/2018 Document Reviewed: 04/08/2018 Elsevier Patient Education  2020 [employer].

## 2020-02-10 LAB — HEPATITIS C ANTIBODY: Hep C Virus Ab: <0.1 s/co ratio (ref 0.0–0.9)

## 2020-02-10 LAB — HIV ANTIBODY (ROUTINE TESTING W REFLEX): HIV Screen 4th Generation wRfx: NONREACTIVE

## 2020-02-10 LAB — RPR: RPR Ser Ql: NONREACTIVE

## 2020-02-14 LAB — CERVICOVAGINAL ANCILLARY ONLY
Bacterial Vaginitis (gardnerella): NEGATIVE
Candida Glabrata: NEGATIVE
Candida Vaginitis: POSITIVE — AB
Chlamydia: NEGATIVE
Comment: NEGATIVE
Comment: NEGATIVE
Comment: NEGATIVE
Comment: NEGATIVE
Comment: NEGATIVE
Comment: NORMAL
Neisseria Gonorrhea: NEGATIVE
Trichomonas: NEGATIVE

## 2020-02-14 MED ORDER — FLUCONAZOLE 150 MG PO TABS: 150.0000 mg | ORAL_TABLET | ORAL | 3 refills | Status: DC

## 2020-02-14 NOTE — Addendum Note (Signed)
Addended by: Jaynie Collins A on: 02/14/2020 02:06 PM   Modules accepted: Orders

## 2020-02-17 LAB — CYTOLOGY - PAP
Comment: NEGATIVE
Comment: NEGATIVE
Diagnosis: UNDETERMINED — AB
HPV 16: NEGATIVE
HPV 18 / 45: POSITIVE — AB
High risk HPV: POSITIVE — AB

## 2020-02-20 ENCOUNTER — Telehealth: Payer: Self-pay

## 2020-02-20 NOTE — Telephone Encounter (Signed)
Call patient to inform her of test result. Patient had already review her results and schedule her colposcopy using my-chart.

## 2020-02-20 NOTE — Telephone Encounter (Signed)
-----   Message from Tereso Newcomer, MD sent at 02/20/2020  9:05 AM EDT ----- Please schedule patient for colposcopy for ASCUS +HRHPV 18/45 pap done on 02/09/2020. Please call to inform patient of results and need for appointment.

## 2020-03-05 ENCOUNTER — Ambulatory Visit (INDEPENDENT_AMBULATORY_CARE_PROVIDER_SITE_OTHER): Payer: Managed Care, Other (non HMO) | Admitting: Obstetrics & Gynecology

## 2020-03-05 ENCOUNTER — Other Ambulatory Visit: Payer: Self-pay | Admitting: Obstetrics & Gynecology

## 2020-03-05 ENCOUNTER — Encounter: Payer: Self-pay | Admitting: Obstetrics & Gynecology

## 2020-03-05 ENCOUNTER — Other Ambulatory Visit: Payer: Self-pay

## 2020-03-05 VITALS — BP 125/83 | HR 88

## 2020-03-05 DIAGNOSIS — R87610 Atypical squamous cells of undetermined significance on cytologic smear of cervix (ASC-US): Secondary | ICD-10-CM | POA: Diagnosis not present

## 2020-03-05 DIAGNOSIS — R87810 Cervical high risk human papillomavirus (HPV) DNA test positive: Secondary | ICD-10-CM | POA: Diagnosis not present

## 2020-03-05 DIAGNOSIS — Z01812 Encounter for preprocedural laboratory examination: Secondary | ICD-10-CM | POA: Diagnosis not present

## 2020-03-05 LAB — POCT URINE PREGNANCY: Preg Test, Ur: NEGATIVE

## 2020-03-05 NOTE — Patient Instructions (Signed)
COLPOSCOPY POST-PROCEDURE INSTRUCTIONS  1. You may take Ibuprofen, Aleve or Tylenol for cramping if needed.  2. If Monsel's solution was used, you will have a black discharge.  3. Light bleeding is normal.  If bleeding is heavier than your period, please call.  4. Put nothing in your vagina until the bleeding or discharge stops (usually 2 or 3 days).  5. We will call you within one week with biopsy results or discuss the results at your follow-up appointment if needed.  

## 2020-03-05 NOTE — Progress Notes (Signed)
    GYNECOLOGY CLINIC COLPOSCOPY PROCEDURE NOTE  49 y.o. 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  Had discussed with Dr. Adolphus Birchwood (GYN ONC) who recommended surveillance colposcopies as needed as long as she has negative cytology. This was scheduled this year as she now has ASCUS. Discussed role for HPV in cervical dysplasia, need for surveillance.  Patient given informed consent, signed copy in the chart, time out was performed.  Placed in lithotomy position. Cervix viewed with speculum and colposcope after application of acetic acid.   Colposcopy adequate? Yes No visible lesions.  ECC specimen obtained,labeled and sent to pathology.  Patient was given post procedure instructions.  Will follow up pathology and manage accordingly; patient will be contacted with results and recommendations.  Routine preventative health maintenance measures emphasized.    Jaynie Collins, MD, FACOG Obstetrician & Gynecologist, [facility] for [employer], [facility]

## 2020-04-03 ENCOUNTER — Ambulatory Visit (INDEPENDENT_AMBULATORY_CARE_PROVIDER_SITE_OTHER): Payer: Managed Care, Other (non HMO) | Admitting: *Deleted

## 2020-04-03 ENCOUNTER — Other Ambulatory Visit (HOSPITAL_COMMUNITY)
Admission: RE | Admit: 2020-04-03 | Discharge: 2020-04-03 | Disposition: A | Discharge status: Home / Self Care | Payer: Managed Care, Other (non HMO) | Source: Ambulatory Visit | Attending: Obstetrics and Gynecology | Admitting: Obstetrics and Gynecology

## 2020-04-03 ENCOUNTER — Other Ambulatory Visit: Payer: Self-pay

## 2020-04-03 VITALS — BP 106/73 | HR 78 | Ht 63.0 in | Wt 151.0 lb

## 2020-04-03 DIAGNOSIS — N898 Other specified noninflammatory disorders of vagina: Secondary | ICD-10-CM

## 2020-04-03 DIAGNOSIS — R300 Dysuria: Secondary | ICD-10-CM | POA: Diagnosis not present

## 2020-04-03 DIAGNOSIS — N3000 Acute cystitis without hematuria: Secondary | ICD-10-CM

## 2020-04-03 LAB — POCT URINALYSIS DIPSTICK OB
Bilirubin, UA: NEGATIVE
Blood, UA: NEGATIVE
Glucose, UA: NEGATIVE
Ketones, UA: NEGATIVE
Nitrite, UA: NEGATIVE
POC,PROTEIN,UA: NEGATIVE
Spec Grav, UA: 1.01 (ref 1.010–1.025)
Urobilinogen, UA: 1 E.U./dL
pH, UA: 5 (ref 5.0–8.0)

## 2020-04-03 MED ORDER — SULFAMETHOXAZOLE-TRIMETHOPRIM 800-160 MG PO TABS: 1.0000 | ORAL_TABLET | Freq: Two times a day (BID) | ORAL | 0 refills | Status: AC

## 2020-04-03 NOTE — Progress Notes (Signed)
Here today for uti symptoms.  She is having burning with urination and lower pelvic pain and lower back pain.  Patient is having vaginal discharge with burning.  She was sent in a diflucan yesterday but is still having symptoms.  She denies any odor. Will collect self swab and send for testing.

## 2020-04-04 LAB — CERVICOVAGINAL ANCILLARY ONLY
Bacterial Vaginitis (gardnerella): NEGATIVE
Candida Glabrata: NEGATIVE
Candida Vaginitis: POSITIVE — AB
Comment: NEGATIVE
Comment: NEGATIVE
Comment: NEGATIVE

## 2020-04-04 LAB — URINE CULTURE

## 2020-04-12 ENCOUNTER — Encounter: Payer: Self-pay | Admitting: Obstetrics & Gynecology

## 2020-04-12 ENCOUNTER — Ambulatory Visit (INDEPENDENT_AMBULATORY_CARE_PROVIDER_SITE_OTHER): Payer: Managed Care, Other (non HMO) | Admitting: Obstetrics & Gynecology

## 2020-04-12 ENCOUNTER — Other Ambulatory Visit: Payer: Self-pay

## 2020-04-12 VITALS — BP 132/81 | HR 87 | Temp 98.1°F | Wt 153.0 lb

## 2020-04-12 DIAGNOSIS — M545 Low back pain, unspecified: Secondary | ICD-10-CM

## 2020-04-12 DIAGNOSIS — R102 Pelvic and perineal pain: Secondary | ICD-10-CM

## 2020-04-12 MED ORDER — CYCLOBENZAPRINE HCL 10 MG PO TABS: 10.0000 mg | ORAL_TABLET | Freq: Three times a day (TID) | ORAL | 1 refills | Status: DC | PRN

## 2020-04-12 MED ORDER — NAPROXEN 500 MG PO TABS: 500.0000 mg | ORAL_TABLET | Freq: Two times a day (BID) | ORAL | 2 refills | Status: DC

## 2020-04-12 NOTE — Patient Instructions (Signed)
Pelvic Pain, Female Pelvic pain is pain in your lower abdomen, below your belly button and between your hips. The pain may start suddenly (be acute), keep coming back (be recurring), or last a long time (become chronic). Pelvic pain that lasts longer than 6 months is considered chronic. Pelvic pain may affect your:  Reproductive organs.  Urinary system.  Digestive tract.  Musculoskeletal system. There are many potential causes of pelvic pain. Sometimes, the pain can be a result of digestive or urinary conditions, strained muscles or ligaments, or reproductive conditions. Sometimes the cause of pelvic pain is not known. Follow these instructions at home:   Take over-the-counter and prescription medicines only as told by your health care provider.  Rest as told by your health care provider.  Do not have sex if it hurts.  Keep a journal of your pelvic pain. Write down: ? When the pain started. ? Where the pain is located. ? What seems to make the pain better or worse, such as food or your period (menstrual cycle). ? Any symptoms you have along with the pain.  Keep all follow-up visits as told by your health care provider. This is important. Contact a health care provider if:  Medicine does not help your pain.  Your pain comes back.  You have new symptoms.  You have abnormal vaginal discharge or bleeding, including bleeding after menopause.  You have a fever or chills.  You are constipated.  You have blood in your urine or stool.  You have foul-smelling urine.  You feel weak or light-headed. Get help right away if:  You have sudden severe pain.  Your pain gets steadily worse.  You have severe pain along with fever, nausea, vomiting, or excessive sweating.  You lose consciousness. Summary  Pelvic pain is pain in your lower abdomen, below your belly button and between your hips.  There are many potential causes of pelvic pain.  Keep a journal of your pelvic  pain. This information is not intended to replace advice given to you by your health care provider. Make sure you discuss any questions you have with your health care provider. Document Revised: 01/13/2018 Document Reviewed: 01/13/2018 Elsevier Patient Education  2020 Elsevier Inc.  

## 2020-04-12 NOTE — Addendum Note (Signed)
Addended by: Scheryl Marten on: 04/12/2020 03:19 PM   Modules accepted: Orders

## 2020-04-12 NOTE — Progress Notes (Signed)
GYNECOLOGY OFFICE VISIT NOTE  History:   [name] [name] is a 49 y.o. G1P1 here today for evaluation of constant, moderate-severe left sided pelvic pain x 2 weeks. Was treated for UTI recently, thought the pain was related. UTI symptoms were alleviated, but pain has worsened. On her left side, radiates to flank and lower back.  Worse when lying down, and also when moving in certain positions. Tylenol does not help. She denies any abnormal vaginal discharge, bleeding, nausea, change in bowel habits, urinary symptoms, fevers or other concerns.    Past Medical History:  Diagnosis Date  . Allergy   . Arthritis   . Asthma   . Constipation   . Endometriosis    Uses Nuvaring continuously  . Headache   . Heart murmur   . Ulcer     Past Surgical History:  Procedure Laterality Date  . ANKLE SURGERY Right 06/11/2017  . APPENDECTOMY  2008  . CESAREAN SECTION  1995  . CHOLECYSTECTOMY  2000  . LAPAROSCOPIC ENDOMETRIOSIS FULGURATION    . spinal tap    . WISDOM TOOTH EXTRACTION     x2    The following portions of the patient's history were reviewed and updated as appropriate: allergies, current medications, past family history, past medical history, past social history, past surgical history and problem list.   Review of Systems:  Pertinent items noted in HPI and remainder of comprehensive ROS otherwise negative.  Physical Exam:  BP 132/81   Pulse 87   Temp 98.1 F (36.7 C)   Wt 153 lb (69.4 kg)   BMI 27.10 kg/m  CONSTITUTIONAL: Well-developed, well-nourished female in mild distress due to pain.  HEENT:  Normocephalic, atraumatic. External right and left ear normal. No scleral icterus.  NECK: Normal range of motion, supple, no masses noted on observation SKIN: No rash noted. Not diaphoretic. No erythema. No pallor. MUSCULOSKELETAL: Normal range of motion. No edema noted. NEUROLOGIC: Alert and oriented to person, place, and time. Normal muscle tone coordination. No cranial nerve  deficit noted. PSYCHIATRIC: Normal mood and affect. Normal behavior. Normal judgment and thought content. CARDIOVASCULAR: Normal heart rate noted RESPIRATORY: Effort and breath sounds normal, no problems with respiration noted ABDOMEN: LLQ tenderness on palpation, moderate. Voluntary guarding, no rebound. No masses noted. No other overt distention noted.   BACK: Mild left CVAT PELVIC: Normal appearing external genitalia; normal urethral meatus; normal appearing distal vaginal mucosa and discharge.  Moderate tenderness of left adnexa, unable to palpate due to voluntary guarding.  Minimal uterine and left adnexal tenderness. Performed in the presence of a chaperone.     Assessment and Plan:      1. Pelvic pain in female 2. Acute left-sided low back pain without sciatica Unsure etiology of her pain. Concerned about left ovary, left kidney, musculoskeletal. No GI symptoms.  Will get imaging of her ovary and kidney,will follow up results and manage accordingly. Naproxen and Flexeril ordered, will take in addition to Tylenol. Told to go to ER for fevers or worsening symptoms.  - US PELVIC COMPLETE WITH TRANSVAGINAL; Future - US RENAL; Future - naproxen (NAPROSYN) 500 MG tablet; Take 1 tablet (500 mg total) by mouth 2 (two) times daily with a meal. As needed for pain  Dispense: 30 tablet; Refill: 2 - cyclobenzaprine (FLEXERIL) 10 MG tablet; Take 1 tablet (10 mg total) by mouth every 8 (eight) hours as needed for muscle spasms.  Dispense: 30 tablet; Refill: 1  Routine preventative health maintenance measures emphasized. Please refer  to After Visit Summary for other counseling recommendations.   Return for any gynecologic concerns.    Total face-to-face time with patient: 20 minutes.  Over 50% of encounter was spent on counseling and coordination of care.   Jaynie Collins, MD, FACOG Obstetrician & Gynecologist, [facility] for [employer], [facility]

## 2020-04-13 ENCOUNTER — Ambulatory Visit
Admission: RE | Admit: 2020-04-13 | Discharge: 2020-04-13 | Disposition: A | Discharge status: Home / Self Care | Payer: Managed Care, Other (non HMO) | Source: Ambulatory Visit | Attending: Obstetrics & Gynecology | Admitting: Obstetrics & Gynecology

## 2020-04-13 ENCOUNTER — Other Ambulatory Visit: Payer: Self-pay

## 2020-04-13 DIAGNOSIS — Z1231 Encounter for screening mammogram for malignant neoplasm of breast: Secondary | ICD-10-CM | POA: Diagnosis present

## 2020-04-14 LAB — URINE CULTURE

## 2020-04-19 ENCOUNTER — Ambulatory Visit
Admission: RE | Admit: 2020-04-19 | Discharge: 2020-04-19 | Disposition: A | Discharge status: Home / Self Care | Payer: Managed Care, Other (non HMO) | Source: Ambulatory Visit | Attending: Obstetrics & Gynecology | Admitting: Obstetrics & Gynecology

## 2020-04-19 ENCOUNTER — Other Ambulatory Visit: Payer: Self-pay

## 2020-04-19 DIAGNOSIS — R102 Pelvic and perineal pain: Secondary | ICD-10-CM

## 2020-04-19 DIAGNOSIS — M545 Low back pain, unspecified: Secondary | ICD-10-CM

## 2020-05-14 ENCOUNTER — Ambulatory Visit: Payer: Self-pay | Admitting: *Deleted

## 2020-05-14 NOTE — Telephone Encounter (Signed)
Patient called [facility] Covid Line to ask if she could get COVID vaccine.  Patient had COVID-19 a couple of months ago.  Patient is now symptom free and is wanting to get the vaccine.  Her niece was told that she had to wait 90 days after having COVID before she could get the vaccine.  I explained to the patient that if you have had an antibody infusion for COVID then you would need to wait 90 days before getting the vaccine.  Patient did not receive an antibody infusion. Again, patient has no symptoms and it has been two months since patient had COVID.  Patient thanked me for the information, but did not want to schedule a vaccine at this time.

## 2020-06-05 ENCOUNTER — Ambulatory Visit (LOCAL_COMMUNITY_HEALTH_CENTER): Payer: Managed Care, Other (non HMO)

## 2020-06-05 ENCOUNTER — Other Ambulatory Visit: Payer: Self-pay

## 2020-06-05 DIAGNOSIS — Z23 Encounter for immunization: Secondary | ICD-10-CM

## 2020-06-05 NOTE — Progress Notes (Signed)
Client uses Nuva-Ring for birth control and uses continuous dosing. Jossie Ng, RN

## 2020-06-08 ENCOUNTER — Other Ambulatory Visit: Payer: Self-pay | Admitting: Obstetrics & Gynecology

## 2020-06-08 DIAGNOSIS — N803 Endometriosis of pelvic peritoneum: Secondary | ICD-10-CM

## 2020-06-08 DIAGNOSIS — IMO0002 Reserved for concepts with insufficient information to code with codable children: Secondary | ICD-10-CM

## 2020-06-24 ENCOUNTER — Other Ambulatory Visit: Payer: Self-pay | Admitting: Family Medicine

## 2020-06-24 DIAGNOSIS — Z889 Allergy status to unspecified drugs, medicaments and biological substances status: Secondary | ICD-10-CM

## 2020-06-24 NOTE — Telephone Encounter (Signed)
Requested Prescriptions  Pending Prescriptions Disp Refills   montelukast (SINGULAIR) 10 MG tablet [Pharmacy Med Name: Montelukast Sodium 10 MG Oral Tablet] 90 tablet 0    Sig: Take 1 tablet by mouth once daily     Pulmonology:  Leukotriene Inhibitors Passed - 06/24/2020  9:40 AM      Passed - Valid encounter within last 12 months    Recent Outpatient Visits          5 months ago Productive cough   [facility] Alba Cory, MD   6 months ago Eczema, unspecified type   [facility] Alba Cory, MD   11 months ago Migraine without aura, intractable   [facility] Alba Cory, MD   11 months ago Upper respiratory tract infection, unspecified type   [facility] Danelle Berry, PA-C   1 year ago New onset headache   [facility] Alba Cory, MD

## 2020-09-10 ENCOUNTER — Other Ambulatory Visit: Payer: Self-pay | Admitting: *Deleted

## 2020-09-10 DIAGNOSIS — N803 Endometriosis of pelvic peritoneum: Secondary | ICD-10-CM

## 2020-09-10 DIAGNOSIS — IMO0002 Reserved for concepts with insufficient information to code with codable children: Secondary | ICD-10-CM

## 2020-09-10 MED ORDER — NUVARING 0.12-0.015 MG/24HR VA RING: VAGINAL_RING | VAGINAL | 12 refills | Status: DC

## 2020-09-24 ENCOUNTER — Other Ambulatory Visit: Payer: Self-pay

## 2020-09-24 ENCOUNTER — Encounter: Payer: Self-pay | Admitting: Obstetrics & Gynecology

## 2020-09-24 ENCOUNTER — Ambulatory Visit (INDEPENDENT_AMBULATORY_CARE_PROVIDER_SITE_OTHER): Payer: Managed Care, Other (non HMO) | Admitting: Obstetrics & Gynecology

## 2020-09-24 VITALS — BP 140/83 | HR 88 | Wt 156.0 lb

## 2020-09-24 DIAGNOSIS — Z3049 Encounter for surveillance of other contraceptives: Secondary | ICD-10-CM

## 2020-09-24 MED ORDER — ETONOGESTREL-ETHINYL ESTRADIOL 0.12-0.015 MG/24HR VA RING: VAGINAL_RING | VAGINAL | 12 refills | Status: DC

## 2020-09-24 NOTE — Patient Instructions (Signed)
Annovera -> Yearly ring.  Research this!!!  [facility] - patch can be alternative to Nuvaring

## 2020-09-24 NOTE — Progress Notes (Signed)
   GYNECOLOGY OFFICE VISIT NOTE  History:   [name] [name] is a 50 y.o. G1P1 here today for request of alternative to Nuvaring.  She has used this for years in a continuous fashion for endometriosis, but her insurance just changed their formulary and she was the last prescription of Nuvaring will cost 464$ for 3 month supply! She denies any abnormal vaginal discharge, bleeding, pelvic pain or other concerns.    Past Medical History:  Diagnosis Date  . Allergy   . Arthritis   . Asthma   . Constipation   . Endometriosis    Uses Nuvaring continuously  . Headache   . Heart murmur   . Ulcer     Past Surgical History:  Procedure Laterality Date  . ANKLE SURGERY Right 06/11/2017  . APPENDECTOMY  2008  . CESAREAN SECTION  1995  . CHOLECYSTECTOMY  2000  . LAPAROSCOPIC ENDOMETRIOSIS FULGURATION    . spinal tap    . WISDOM TOOTH EXTRACTION     x2    The following portions of the patient's history were reviewed and updated as appropriate: allergies, current medications, past family history, past medical history, past social history, past surgical history and problem list.   Health Maintenance:  ASCUS pap and positive HRHPV on 03/05/2020, benign colposcopy .  Normal mammogram on 04/13/2020.   Review of Systems:  Pertinent items noted in HPI and remainder of comprehensive ROS otherwise negative.  Physical Exam:  BP 140/83   Pulse 88   Wt 156 lb (70.8 kg)   BMI 27.63 kg/m  CONSTITUTIONAL: Well-developed, well-nourished female in no acute distress.  HEENT:  Normocephalic, atraumatic. External right and left ear normal. No scleral icterus.  NECK: Normal range of motion, supple, no masses noted on observation SKIN: No rash noted. Not diaphoretic. No erythema. No pallor. MUSCULOSKELETAL: Normal range of motion. No edema noted. NEUROLOGIC: Alert and oriented to person, place, and time. Normal muscle tone coordination. No cranial nerve deficit noted. PSYCHIATRIC: Normal mood and affect.  Normal behavior. Normal judgment and thought content. CARDIOVASCULAR: Normal heart rate noted RESPIRATORY: Effort and breath sounds normal, no problems with respiration noted ABDOMEN: No masses noted. No other overt distention noted.   PELVIC: Deferred     Assessment and Plan:    1. Encounter for surveillance of nuvaring On review of prescription, the brand name was prescribed. Changed this to generic equivalent, patient assured this was same medication. If she still has issues with cost, offered alternatives of Xulane patch vs Annovera (new yearly ring with a patient's savings card).  She does not want oral pills.  She will try again and let us know if she is able to make the insurance pay for this, if not she will let us know if she wants to go with the alternatives. - etonogestrel-ethinyl estradiol (NUVARING) 0.12-0.015 MG/24HR vaginal ring; INSERT ONE RING VAGINALLY EVERY 28 DAYS. USE IN CONTINUOUS FASHION FOR ENDOMETRIOSIS.  Dispense: 3 each; Refill: 12 Routine preventative health maintenance measures emphasized. Please refer to After Visit Summary for other counseling recommendations.   Return for any gynecologic concerns.    I spent 25 minutes dedicated to the care of this patient including pre-visit review of records, face to face time with the patient discussing her conditions and treatments and post visit ordering of testing.    Jaynie Collins, MD, FACOG Obstetrician & Gynecologist, [facility] for [employer], [facility]

## 2020-09-27 ENCOUNTER — Encounter: Payer: Self-pay | Admitting: *Deleted

## 2021-01-29 ENCOUNTER — Ambulatory Visit: Payer: Managed Care, Other (non HMO) | Admitting: Podiatry

## 2021-02-09 ENCOUNTER — Other Ambulatory Visit: Payer: Self-pay | Admitting: Obstetrics & Gynecology

## 2021-02-09 DIAGNOSIS — B372 Candidiasis of skin and nail: Secondary | ICD-10-CM

## 2021-02-20 ENCOUNTER — Other Ambulatory Visit
Admission: RE | Admit: 2021-02-20 | Discharge: 2021-02-20 | Disposition: A | Discharge status: Home / Self Care | Payer: Managed Care, Other (non HMO) | Source: Ambulatory Visit | Attending: Pulmonary Disease | Admitting: Pulmonary Disease

## 2021-02-20 ENCOUNTER — Other Ambulatory Visit: Payer: Self-pay

## 2021-02-20 ENCOUNTER — Telehealth: Payer: Self-pay | Admitting: Pulmonary Disease

## 2021-02-20 ENCOUNTER — Encounter: Payer: Self-pay | Admitting: Pulmonary Disease

## 2021-02-20 ENCOUNTER — Ambulatory Visit (INDEPENDENT_AMBULATORY_CARE_PROVIDER_SITE_OTHER): Payer: Managed Care, Other (non HMO) | Admitting: Pulmonary Disease

## 2021-02-20 VITALS — BP 128/74 | HR 100 | Temp 97.9°F | Ht 63.0 in | Wt 152.4 lb

## 2021-02-20 DIAGNOSIS — R058 Other specified cough: Secondary | ICD-10-CM

## 2021-02-20 DIAGNOSIS — R0981 Nasal congestion: Secondary | ICD-10-CM | POA: Diagnosis present

## 2021-02-20 DIAGNOSIS — L209 Atopic dermatitis, unspecified: Secondary | ICD-10-CM

## 2021-02-20 DIAGNOSIS — R0982 Postnasal drip: Secondary | ICD-10-CM

## 2021-02-20 DIAGNOSIS — J3089 Other allergic rhinitis: Secondary | ICD-10-CM | POA: Diagnosis not present

## 2021-02-20 DIAGNOSIS — J4521 Mild intermittent asthma with (acute) exacerbation: Secondary | ICD-10-CM

## 2021-02-20 DIAGNOSIS — J302 Other seasonal allergic rhinitis: Secondary | ICD-10-CM

## 2021-02-20 LAB — CBC WITH DIFFERENTIAL/PLATELET
Abs Immature Granulocytes: 0.04 10*3/uL (ref 0.00–0.07)
Basophils Absolute: 0.1 10*3/uL (ref 0.0–0.1)
Basophils Relative: 1 %
Eosinophils Absolute: 0.3 10*3/uL (ref 0.0–0.5)
Eosinophils Relative: 3 %
HCT: 42.3 % (ref 36.0–46.0)
Hemoglobin: 14.8 g/dL (ref 12.0–15.0)
Immature Granulocytes: 0 %
Lymphocytes Relative: 15 %
Lymphs Abs: 1.6 10*3/uL (ref 0.7–4.0)
MCH: 29.7 pg (ref 26.0–34.0)
MCHC: 35 g/dL (ref 30.0–36.0)
MCV: 84.8 fL (ref 80.0–100.0)
Monocytes Absolute: 1.1 10*3/uL — ABNORMAL HIGH (ref 0.1–1.0)
Monocytes Relative: 10 %
Neutro Abs: 7.4 10*3/uL (ref 1.7–7.7)
Neutrophils Relative %: 71 %
Platelets: 394 10*3/uL (ref 150–400)
RBC: 4.99 MIL/uL (ref 3.87–5.11)
RDW: 13.6 % (ref 11.5–15.5)
Smear Review: NORMAL
WBC: 10.4 10*3/uL (ref 4.0–10.5)
nRBC: 0 % (ref 0.0–0.2)

## 2021-02-20 MED ORDER — SPIRIVA RESPIMAT 1.25 MCG/ACT IN AERS: 2.0000 | INHALATION_SPRAY | Freq: Every day | RESPIRATORY_TRACT | 0 refills | Status: DC

## 2021-02-20 NOTE — Patient Instructions (Signed)
We are adding an inhaler called Spiriva 1.25 mcg, 2 inhalations 1 time a day.  You can do this after your Breo.  We are getting breathing tests, a sinus CT and some blood tests to check on the issues you are having.  We will see her in follow-up in 4 to 6 weeks time call sooner should any new problems arise.  You may see me or my nurse practitioner in follow-up.

## 2021-02-20 NOTE — Telephone Encounter (Signed)
ATC patient--mailbox is full. Will call back.  ?

## 2021-02-20 NOTE — Progress Notes (Signed)
Subjective:    Patient ID: [name] [name], female    DOB: Nov 12, 1970, 50 y.o.   MRN: [id number] Chief Complaint  Patient presents with   Follow-up    C/o dry cough, chest congestion and sob with exertion and coughing spells x24mo   HPI Patient is a 50 year old lifelong never smoker with a history of mild intermittent asthma who presents for reevaluation due to dry cough chest congestion and increasing shortness of breath over 1 month.  We last saw the patient on 05 October 2018 and she was lost to follow-up since then.  She has been maintained on Breo Ellipta 100/25, 1 inhalation daily as well as as needed albuterol.  She developed the issues with cough which are worse at nighttime when she lays down.  Nothing in particular makes it better according to her.  She has noted issues with eczema and perennial allergic rhinitis with seasonal variation.  Her speech today appears to be quite nasal and she has constant sniffling.  In the past she had issues with reflux that required PPI however she is not currently on this medication.  She does not endorse much issues with reflux at present.  She has not had any fevers, chills or sweats.  Chest x-ray on 30 June performed at [facility] he states did not show any acute process.  I cannot see the films.  He also had a negative COVID test on 30 June.  She has been treated with antibiotics without improvement.  She has been on Zyrtec nocturnally, has taken several preparations over-the-counter such as Mucinex DM without much relief.  She continues to use [employer] as noted above, as needed albuterol and Singulair.  None of these things have provided lasting effect.  She has not used the albuterol when she gets into coughing spells.  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  He does not endorse any other issues nor symptomatology.   Review of Systems A 10 point  review of systems was performed and it is as noted above otherwise negative.  Past Medical History:  Diagnosis Date   Allergy    Arthritis    Asthma    Constipation    Endometriosis    Uses Nuvaring continuously   Headache    Heart murmur    Ulcer    Past Surgical History:  Procedure Laterality Date   ANKLE SURGERY Right 06/11/2017   APPENDECTOMY  2008   CESAREAN SECTION  1995   CHOLECYSTECTOMY  2000   LAPAROSCOPIC ENDOMETRIOSIS FULGURATION     spinal tap     WISDOM TOOTH EXTRACTION     x2   Family History  Problem Relation Age of Onset   COPD Mother    Emphysema Mother    Cancer Father        unknown   Cancer Paternal Aunt        ovarian   Breast cancer Neg Hx    Social History   Tobacco Use   Smoking status: Never   Smokeless tobacco: Never  Substance Use Topics   Alcohol use: No    Alcohol/week: 0.0 standard drinks   Allergies  Allergen Reactions   Symbicort [Budesonide-Formoterol Fumarate] Itching   Mosquito (Culex Pipiens) Allergy Skin Test Swelling   Mosquito (Diagnostic) Swelling   Current Meds  Medication Sig   albuterol (VENTOLIN HFA) 108 (90 Base) MCG/ACT inhaler Inhale  2 puffs into the lungs every 6 (six) hours as needed for wheezing or shortness of breath.   Azelastine HCl 0.15 % SOLN Place 1 spray into the nose daily.   cyclobenzaprine (FLEXERIL) 10 MG tablet Take 1 tablet (10 mg total) by mouth every 8 (eight) hours as needed for muscle spasms.   desonide (DESOWEN) 0.05 % cream Apply topically 2 (two) times daily.   etonogestrel-ethinyl estradiol (NUVARING) 0.12-0.015 MG/24HR vaginal ring INSERT ONE RING VAGINALLY EVERY 28 DAYS. USE IN CONTINUOUS FASHION FOR ENDOMETRIOSIS.   EUCRISA 2 % OINT APPLY TO THE AFFECTED AREAS TWICE DAILY AS NEEDED UNTIL SMOOTH CLEAR   fluticasone (FLONASE) 50 MCG/ACT nasal spray Place 2 sprays into both nostrils daily.   fluticasone furoate-vilanterol (BREO ELLIPTA) 100-25 MCG/INH AEPB Inhale 1 puff by mouth once  daily   ibuprofen (ADVIL) 800 MG tablet Take 1 tablet (800 mg total) by mouth every 8 (eight) hours as needed.   montelukast (SINGULAIR) 10 MG tablet Take 1 tablet by mouth once daily   naproxen (NAPROSYN) 500 MG tablet Take 1 tablet (500 mg total) by mouth 2 (two) times daily with a meal. As needed for pain   nystatin cream (MYCOSTATIN) APPLY  CREAM TOPICALLY TWICE DAILY TO AFFECTED AREA   Spacer/Aero-Holding Chambers DEVI 1 each by Does not apply route daily.   SUMAtriptan (IMITREX) 100 MG tablet Take 100 mg by mouth 2 (two) times daily as needed.   triamcinolone cream (KENALOG) 0.1 % Apply topically 2 (two) times daily.   [DISCONTINUED] fluconazole (DIFLUCAN) 150 MG tablet Take 1 tablet (150 mg total) by mouth every 3 (three) days. For three doses   [DISCONTINUED] loratadine (CLARITIN) 10 MG tablet Take 1 tablet (10 mg total) by mouth every morning.   Immunization History  Administered Date(s) Administered   Influenza,inj,Quad PF,6+ Mos 05/31/2019, 06/05/2020   Influenza-Unspecified 06/03/2018, 05/31/2019, 06/05/2020   PFIZER(Purple Top)SARS-COV-2 Vaccination 10/13/2019, 11/03/2019, 08/07/2020   Tdap 09/27/2018       Objective:   Physical Exam BP 128/74 (BP Location: Left Arm, Cuff Size: Normal)   Pulse 100   Temp 97.9 F (36.6 C) (Temporal)   Ht 5\' 3"  (1.6 m)   Wt 152 lb 6.4 oz (69.1 kg)   SpO2 96%   BMI 27.00 kg/m  GENERAL: Well-developed well-nourished woman, no acute distress, fully ambulatory.  No conversational dyspnea.  Does almost continuous sniffling and clearing of the throat during the examination HEAD: Normocephalic, atraumatic.  Some tenderness along the maxillary sinuses area. EYES: Pupils equal, round, reactive to light.  No scleral icterus.  MOUTH: Nose/mouth/throat not examined due to masking requirements for COVID 19. NECK: Supple. No thyromegaly. Trachea midline. No JVD.  No adenopathy. PULMONARY: Good air entry bilaterally.  No adventitious  sounds. CARDIOVASCULAR: S1 and S2. Regular rate and rhythm.  No rubs, murmurs or gallops heard. ABDOMEN: Benign. MUSCULOSKELETAL: No joint deformity, no clubbing, no edema.  NEUROLOGIC: No focal deficit, no gait disturbance, speech is fluent. SKIN: Intact,warm,dry. PSYCH: Mood and behavior normal    Assessment & Plan:     ICD-10-CM   1. Mild intermittent asthma with acute exacerbation  J45.21    She appears to have an exacerbation She is sensitive to systemic steroids so we will try to avoid Trial of adding Spiriva 1.25 mcg 2 puffs day    2. Cough with congestion of paranasal sinus  R05.8 CT MAXILLOFACIAL WO CONTRAST   R09.81 CBC w/Diff    Allergen Panel (27) + IGE    Pulmonary  Function Test [facility] Only    CANCELED: Pulmonary Function Test [facility] Only   Physical exam consistent with likely sinus disease Suspect upper airway cough syndrome related to sinus disease CT sinuses for evaluation     3. PND (post-nasal drip)  R09.82    Continue nasal hygiene Continue azelastine    4. Perennial allergic rhinitis with seasonal variation  J30.89    J30.2    Continue nasal hygiene    5. Atopic dermatitis, unspecified type  L20.9    This issue adds complexity to her management      Orders Placed This Encounter  Procedures   CT MAXILLOFACIAL WO CONTRAST    Standing Status:   Future    Standing Expiration Date:   02/20/2022    Order Specific Question:   Is patient pregnant?    Answer:   No    Order Specific Question:   Preferred imaging location?    Answer:   [facility]   CBC w/Diff    Standing Status:   Future    Standing Expiration Date:   02/20/2022   Allergen Panel (27) + IGE    Standing Status:   Future    Standing Expiration Date:   02/20/2022   Pulmonary Function Test [facility] Only    Standing Status:   Future    Standing Expiration Date:   02/20/2022    Order Specific Question:   Full PFT: includes the following: basic spirometry, spirometry pre & post bronchodilator,  diffusion capacity (DLCO), lung volumes    Answer:   Full PFT   Meds ordered this encounter  Medications   Tiotropium Bromide Monohydrate (SPIRIVA RESPIMAT) 1.25 MCG/ACT AERS    Sig: Inhale 2 puffs into the lungs daily.    Dispense:  4 g    Refill:  0    Order Specific Question:   Lot Number?    Answer:   [id number]    Order Specific Question:   Expiration Date?    Answer:   03/11/2022    Order Specific Question:   Quantity    Answer:   2   Tiotropium Bromide Monohydrate (SPIRIVA RESPIMAT) 1.25 MCG/ACT AERS    Sig: Inhale 2 puffs into the lungs daily.    Dispense:  4 g    Refill:  0    Order Specific Question:   Lot Number?    Answer:   [id number]    Order Specific Question:   Expiration Date?    Answer:   03/11/2022    Order Specific Question:   Quantity    Answer:   1   Discussion:  Her symptoms appear driven by sinus disease.  We will proceed with obtaining CT sinuses to evaluate this.  She may well need referral to ENT.  We will give her a trial of Spiriva to see if this helps control her cough.  Will obtain pulmonary function testing to reassess her asthma, CBC with differential and allergen panel to reevaluate potential allergies.  She may require biologics for asthma control.  Gailen Shelter, MD [facility]   *This note was dictated using voice recognition software/Dragon.  Despite best efforts to proofread, errors can occur which can change the meaning.  Any change was purely unintentional.

## 2021-02-21 NOTE — Telephone Encounter (Signed)
ATC patient x2--mailbox is full.  Will close encounter per office protocol.

## 2021-02-23 LAB — ALLERGEN PANEL (27) + IGE
Alternaria Alternata IgE: <0.1 kU/L
Aspergillus Fumigatus IgE: <0.1 kU/L
Bahia Grass IgE: <0.1 kU/L
Bermuda Grass IgE: <0.1 kU/L
Cat Dander IgE: 0.47 kU/L — AB
Cedar, Mountain IgE: <0.1 kU/L
Cladosporium Herbarum IgE: <0.1 kU/L
Cocklebur IgE: <0.1 kU/L
Cockroach, American IgE: <0.1 kU/L
Common Silver Birch IgE: <0.1 kU/L
D Farinae IgE: 2.91 kU/L — AB
D Pteronyssinus IgE: 3.36 kU/L — AB
Dog Dander IgE: 0.12 kU/L — AB
Elm, American IgE: <0.1 kU/L
Hickory, White IgE: <0.1 kU/L
IgE (Immunoglobulin E), Serum: 15 IU/mL (ref 6–495)
Johnson Grass IgE: <0.1 kU/L
Kentucky Bluegrass IgE: <0.1 kU/L
Maple/Box Elder IgE: <0.1 kU/L
Mucor Racemosus IgE: <0.1 kU/L
Oak, White IgE: <0.1 kU/L
Penicillium Chrysogen IgE: <0.1 kU/L
Pigweed, Rough IgE: <0.1 kU/L
Plantain, English IgE: <0.1 kU/L
Ragweed, Short IgE: 0.14 kU/L — AB
Setomelanomma Rostrat: <0.1 kU/L
Timothy Grass IgE: <0.1 kU/L
White Mulberry IgE: <0.1 kU/L

## 2021-02-27 ENCOUNTER — Telehealth: Payer: Self-pay

## 2021-02-27 DIAGNOSIS — J4521 Mild intermittent asthma with (acute) exacerbation: Secondary | ICD-10-CM

## 2021-02-27 NOTE — Telephone Encounter (Signed)
Spoke to patient today about test results, pt had a clear understanding.

## 2021-02-27 NOTE — Telephone Encounter (Signed)
Created in error

## 2021-02-28 ENCOUNTER — Other Ambulatory Visit: Payer: Self-pay

## 2021-02-28 ENCOUNTER — Ambulatory Visit
Admission: RE | Admit: 2021-02-28 | Discharge: 2021-02-28 | Disposition: A | Discharge status: Home / Self Care | Payer: Managed Care, Other (non HMO) | Source: Ambulatory Visit | Attending: Pulmonary Disease | Admitting: Pulmonary Disease

## 2021-02-28 DIAGNOSIS — R0981 Nasal congestion: Secondary | ICD-10-CM | POA: Insufficient documentation

## 2021-02-28 DIAGNOSIS — R058 Other specified cough: Secondary | ICD-10-CM | POA: Diagnosis present

## 2021-03-05 ENCOUNTER — Telehealth: Payer: Self-pay | Admitting: Pulmonary Disease

## 2021-03-05 NOTE — Telephone Encounter (Signed)
CT of the sinuses does not show active sinusitis however she does have significant inflammation of her nasal passages.  This in particular on the right.  Due to issues with postnasal drip that can aggravate cough.  Continuedoing nasal hygiene and using her nasal sprays.   Lm for patient.

## 2021-03-05 NOTE — Telephone Encounter (Signed)
Lm x1 for patient.  

## 2021-03-05 NOTE — Telephone Encounter (Signed)
Patient is aware of below results and voiced her understanding.  Nothing further needed.  

## 2021-03-05 NOTE — Telephone Encounter (Signed)
Lm for patient.  

## 2021-03-18 ENCOUNTER — Other Ambulatory Visit: Payer: Self-pay

## 2021-03-18 ENCOUNTER — Other Ambulatory Visit
Admission: RE | Admit: 2021-03-18 | Discharge: 2021-03-18 | Disposition: A | Discharge status: Home / Self Care | Payer: Managed Care, Other (non HMO) | Source: Ambulatory Visit | Attending: Pulmonary Disease | Admitting: Pulmonary Disease

## 2021-03-18 DIAGNOSIS — Z01812 Encounter for preprocedural laboratory examination: Secondary | ICD-10-CM | POA: Insufficient documentation

## 2021-03-18 DIAGNOSIS — Z20822 Contact with and (suspected) exposure to covid-19: Secondary | ICD-10-CM | POA: Insufficient documentation

## 2021-03-18 LAB — SARS CORONAVIRUS 2 (TAT 6-24 HRS): SARS Coronavirus 2: NEGATIVE

## 2021-03-19 ENCOUNTER — Ambulatory Visit: Payer: Managed Care, Other (non HMO) | Attending: Pulmonary Disease

## 2021-03-19 DIAGNOSIS — R0981 Nasal congestion: Secondary | ICD-10-CM | POA: Insufficient documentation

## 2021-03-19 DIAGNOSIS — R058 Other specified cough: Secondary | ICD-10-CM

## 2021-03-20 ENCOUNTER — Telehealth: Payer: Self-pay

## 2021-03-20 NOTE — Telephone Encounter (Signed)
Received this message from Ames Dura: [11:35 AM] Ames Dura  Hey can you possibly see if my 9am on 8/17 in [place] can either be a switched to televisit or rescheduled to my 1:30pm slot that same day. I have a doc apt at 8am and doubt ill make it by 9am to [place].   ATC patient, LMTCB

## 2021-03-21 NOTE — Telephone Encounter (Signed)
Melissa ATC patient left messages on cell phone and work phone.

## 2021-03-21 NOTE — Telephone Encounter (Signed)
Patient has been rescheduled for that day and is coming in at 1:30. Since there is no 1:30 slot we have double booked the 2pm slot but patient is aware to come at 1:30. Will route to [place] as FYI. Nothing further needed.

## 2021-03-22 NOTE — Telephone Encounter (Signed)
Just FYI on Wednesday this patient will be coming at 1:30

## 2021-03-27 ENCOUNTER — Ambulatory Visit (INDEPENDENT_AMBULATORY_CARE_PROVIDER_SITE_OTHER): Payer: Managed Care, Other (non HMO) | Admitting: Primary Care

## 2021-03-27 ENCOUNTER — Other Ambulatory Visit: Payer: Self-pay

## 2021-03-27 ENCOUNTER — Ambulatory Visit: Payer: Managed Care, Other (non HMO) | Admitting: Primary Care

## 2021-03-27 ENCOUNTER — Encounter: Payer: Self-pay | Admitting: Primary Care

## 2021-03-27 VITALS — BP 100/76 | HR 87 | Temp 99.6°F | Ht 63.0 in | Wt 156.4 lb

## 2021-03-27 DIAGNOSIS — J4521 Mild intermittent asthma with (acute) exacerbation: Secondary | ICD-10-CM

## 2021-03-27 NOTE — Patient Instructions (Addendum)
  CT sinuses in July 2022 did not show any active sinusitis, however, you did have significant inflammation of nasal passages especially on the right.  Pulmonary function testing in August 2022 showed no obvious evidence of obstructive airway disease or restrictive lung disease.  Allergy testing was sensitive to dust mites, dog/cat dander and ragweed.  Continue Breo 100, Spiriva Respimat 1.25 mcg, Zyrtec, Singulair at bedtime and Flonase/ Astelin nasal spray  Add saline nasal rinses (either netti pot or ocean nasal spray twice a day)  Referral: Allergy re: allergic rhinitis   Follow-up: 6 months with Dr. Jayme [name] or sooner if needed

## 2021-03-27 NOTE — Progress Notes (Signed)
@Patient  ID: , female    DOB: 1970/12/23, 50 y.o.   MRN: [id number]  No chief complaint on file.   Referring provider: [id number], MD  HPI: 49 year old female, never smoked.  Past medical history significant for mild intermittent asthma, allergic rhinitis, migraine syndrome, endometriosis.  Patient of Dr. 44, last seen in office on 02/20/2021.   Maintained on Breo, Spiriva Respimat 1.25 mcg, Singulair and Flonase.   03/27/2021 Patient presents today for 1 month follow-up. She has noticed some improvement in dyspnea symptoms and cough with addition of Spiriva Respimat 1.68mcg. Cough is still present but not as bad as it was. Her cough is congested but non-productive. She has associated post nasal drip and sinus symptoms. CT sinuses in July 2022 did not show any active sinusitis, however, she did have significant inflammation of nasal passages especially on the right.  Pulmonary function testing in August 2022 showed no obvious evidence of obstructive airway disease or restrictive lung disease.  Allergy testing was sensitive to dust mites, dog/cat dander and ragweed. She has not other acute complaints.   Testing: 03/19/2021 PFTs-FVC 2.79 (86%), FEV1 2.24 (96%), ratio 81, TLC 78%, DLCO 20.35 (73%)   Allergies  Allergen Reactions   Symbicort [Budesonide-Formoterol Fumarate] Itching   Mosquito (Culex Pipiens) Allergy Skin Test Swelling   Mosquito (Diagnostic) Swelling    Immunization History  Administered Date(s) Administered   Influenza,inj,Quad PF,6+ Mos 05/31/2019, 06/05/2020   Influenza-Unspecified 06/03/2018, 05/31/2019, 06/05/2020   PFIZER(Purple Top)SARS-COV-2 Vaccination 10/13/2019, 11/03/2019, 08/07/2020   Tdap 09/27/2018    Past Medical History:  Diagnosis Date   Allergy    Arthritis    Asthma    Constipation    Endometriosis    Uses Nuvaring continuously   Headache    Heart murmur    Ulcer     Tobacco History: Social History   Tobacco  Use  Smoking Status Never  Smokeless Tobacco Never   Counseling given: Not Answered   Outpatient Medications Prior to Visit  Medication Sig Dispense Refill   albuterol (VENTOLIN HFA) 108 (90 Base) MCG/ACT inhaler Inhale 2 puffs into the lungs every 6 (six) hours as needed for wheezing or shortness of breath. 18 g 1   Azelastine HCl 0.15 % SOLN Place 1 spray into the nose daily. 90 mL 1   Cetirizine HCl 10 MG CAPS Take 10 mg by mouth daily.     cyclobenzaprine (FLEXERIL) 10 MG tablet Take 1 tablet (10 mg total) by mouth every 8 (eight) hours as needed for muscle spasms. 30 tablet 1   desonide (DESOWEN) 0.05 % cream Apply topically 2 (two) times daily. 60 g 0   etonogestrel-ethinyl estradiol (NUVARING) 0.12-0.015 MG/24HR vaginal ring INSERT ONE RING VAGINALLY EVERY 28 DAYS. USE IN CONTINUOUS FASHION FOR ENDOMETRIOSIS. 3 each 12   EUCRISA 2 % OINT APPLY TO THE AFFECTED AREAS TWICE DAILY AS NEEDED UNTIL SMOOTH CLEAR     fluticasone (FLONASE) 50 MCG/ACT nasal spray Place 2 sprays into both nostrils daily. 48 g 1   fluticasone furoate-vilanterol (BREO ELLIPTA) 100-25 MCG/INH AEPB Inhale 1 puff by mouth once daily 60 each 5   ibuprofen (ADVIL) 800 MG tablet Take 1 tablet (800 mg total) by mouth every 8 (eight) hours as needed. 60 tablet 1   montelukast (SINGULAIR) 10 MG tablet Take 1 tablet by mouth once daily 90 tablet 0   naproxen (NAPROSYN) 500 MG tablet Take 1 tablet (500 mg total) by mouth 2 (two) times daily with a meal.  As needed for pain 30 tablet 2   nystatin cream (MYCOSTATIN) APPLY  CREAM TOPICALLY TWICE DAILY TO AFFECTED AREA 30 g 0   Spacer/Aero-Holding Chambers DEVI 1 each by Does not apply route daily. 1 each 0   SUMAtriptan (IMITREX) 100 MG tablet Take 100 mg by mouth 2 (two) times daily as needed.     Tiotropium Bromide Monohydrate (SPIRIVA RESPIMAT) 1.25 MCG/ACT AERS Inhale 2 puffs into the lungs daily. 4 g 0   Tiotropium Bromide Monohydrate (SPIRIVA RESPIMAT) 1.25 MCG/ACT AERS  Inhale 2 puffs into the lungs daily. 4 g 0   triamcinolone cream (KENALOG) 0.1 % Apply topically 2 (two) times daily. 453.6 g 0   dextromethorphan-guaiFENesin (MUCINEX DM) 30-600 MG 12hr tablet Take 1 tablet by mouth every 12 (twelve) hours.     nortriptyline (PAMELOR) 25 MG capsule Take by mouth. (Patient not taking: Reported on 04/03/2020)     No facility-administered medications prior to visit.    Review of Systems  Review of Systems  Constitutional: Negative.   HENT:  Positive for congestion.   Respiratory:  Positive for cough and shortness of breath. Negative for chest tightness and wheezing.   Psychiatric/Behavioral: Negative.      Physical Exam  BP 100/76 (BP Location: Left Arm, Patient Position: Sitting, Cuff Size: Normal)   Pulse 87   Temp 99.6 F (37.6 C) (Oral)   Ht 5\' 3"  (1.6 m)   Wt 156 lb 6.4 oz (70.9 kg)   SpO2 99%   BMI 27.71 kg/m  Physical Exam Constitutional:      Appearance: Normal appearance.  HENT:     Head: Normocephalic and atraumatic.     Mouth/Throat:     Mouth: Mucous membranes are moist.     Pharynx: Oropharynx is clear.     Comments: Nasal congestion Cardiovascular:     Rate and Rhythm: Normal rate and regular rhythm.     Comments: Trace right lower leg swelling d/t past ankle injury  Pulmonary:     Effort: Pulmonary effort is normal.     Breath sounds: Normal breath sounds. No wheezing.  Musculoskeletal:        General: Normal range of motion.  Skin:    General: Skin is warm and dry.  Neurological:     General: No focal deficit present.     Mental Status: She is alert and oriented to person, place, and time. Mental status is at baseline.  Psychiatric:        Mood and Affect: Mood normal.        Behavior: Behavior normal.        Thought Content: Thought content normal.        Judgment: Judgment normal.     Lab Results:  CBC    Component Value Date/Time   WBC 10.4 02/20/2021 1010   RBC 4.99 02/20/2021 1010   HGB 14.8 02/20/2021  1010   HGB 14.0 03/03/2019 0942   HCT 42.3 02/20/2021 1010   HCT 41.7 03/03/2019 0942   PLT 394 02/20/2021 1010   PLT 314 03/03/2019 0942   MCV 84.8 02/20/2021 1010   MCV 85 03/03/2019 0942   MCH 29.7 02/20/2021 1010   MCHC 35.0 02/20/2021 1010   RDW 13.6 02/20/2021 1010   RDW 13.4 03/03/2019 0942   LYMPHSABS 1.6 02/20/2021 1010   LYMPHSABS 0.9 09/20/2018 1200   MONOABS 1.1 (H) 02/20/2021 1010   EOSABS 0.3 02/20/2021 1010   EOSABS 0.1 09/20/2018 1200   BASOSABS 0.1 02/20/2021 1010  BASOSABS 0.0 09/20/2018 1200    BMET    Component Value Date/Time   NA 137 07/05/2019 1052   NA 140 03/03/2019 0942   K 3.5 07/05/2019 1052   CL 108 07/05/2019 1052   CO2 19 (L) 07/05/2019 1052   GLUCOSE 110 (H) 07/05/2019 1052   BUN 11 07/05/2019 1052   BUN 13 03/03/2019 0942   CREATININE 0.61 07/05/2019 1052   CREATININE 0.69 02/15/2016 0954   CALCIUM 8.6 (L) 07/05/2019 1052   GFRNONAA >60 07/05/2019 1052   GFRAA >60 07/05/2019 1052    BNP No results found for: BNP  ProBNP No results found for: PROBNP  Imaging: Pulmonary Function Test [facility] Only  Result Date: 03/19/2021 Spirometry Data Is NOT Acceptable and Reproducible No obvious evidence of Obstructive Airways disease or Restrictive Lung disease Consider outpatient follow up with Pulmonary if needed. Clinical Correlation Advised         CT MAXILLOFACIAL WO CONTRAST  Result Date: 03/01/2021 CLINICAL DATA:  Cough and sinusitis EXAM: CT MAXILLOFACIAL WITHOUT CONTRAST TECHNIQUE: Multidetector CT images of the paranasal sinuses were obtained using the standard protocol without intravenous contrast. COMPARISON:  CT head 05/03/2013 FINDINGS: Paranasal sinuses: Frontal: Normally aerated. Patent frontal sinus drainage pathways. Ethmoid: Normally aerated. Maxillary: Small area mucosal edema left maxillary sinus. Right maxillary sinus clear Sphenoid: Normally aerated. Patent sphenoethmoidal recesses. Mastoid: Clear bilaterally. Right ostiomeatal  unit: Patent. Left ostiomeatal unit: Patent. Nasal passages: Patent. Mild septal deviation to the left Anatomy: Keros type 1 olfactory recess Limited intracranial imaging negative Negative orbit bilaterally. IMPRESSION: Mild mucosal edema left maxillary sinus. Remaining sinuses clear. Nasal drainage pathways clear. Electronically Signed   By: Marlan Palau M.D.   On: 03/01/2021 08:23     Assessment & Plan:   Asthma, mild intermittent - Symptoms are driven by PND/sinusitis issues. She does reports some improvement with addition of Spiriva Respimat. CT sinuses showed inflammation of nasal passages. She has sensitivities to dust mites, dog/cat dander and ragweed. We will refer her to allergy. Adding nasal saline rinses. Continue Breo 100, Spiriva Respimat 1.25 mcg, Zyrtec, Singulair at bedtime and Flonase/ Astelin nasal spray. FU in 6 months with Dr. Jayme Cloud.      Glenford Bayley, NP 03/28/2021

## 2021-03-28 ENCOUNTER — Telehealth: Payer: Self-pay

## 2021-03-28 NOTE — Telephone Encounter (Signed)
Pt has already completed covid test and PFT. Nothing further needed.

## 2021-03-28 NOTE — Assessment & Plan Note (Signed)
-   Symptoms are driven by PND/sinusitis issues. She does reports some improvement with addition of Spiriva Respimat. CT sinuses showed inflammation of nasal passages. She has sensitivities to dust mites, dog/cat dander and ragweed. We will refer her to allergy. Adding nasal saline rinses. Continue Breo 100, Spiriva Respimat 1.25 mcg, Zyrtec, Singulair at bedtime and Flonase/ Astelin nasal spray. FU in 6 months with Dr. Jayme Cloud.

## 2021-04-09 ENCOUNTER — Other Ambulatory Visit: Payer: Self-pay

## 2021-04-09 ENCOUNTER — Encounter: Payer: Self-pay | Admitting: Obstetrics & Gynecology

## 2021-04-09 ENCOUNTER — Other Ambulatory Visit (HOSPITAL_COMMUNITY)
Admission: RE | Admit: 2021-04-09 | Discharge: 2021-04-09 | Disposition: A | Discharge status: Home / Self Care | Payer: Managed Care, Other (non HMO) | Source: Ambulatory Visit | Attending: Obstetrics & Gynecology | Admitting: Obstetrics & Gynecology

## 2021-04-09 ENCOUNTER — Ambulatory Visit (INDEPENDENT_AMBULATORY_CARE_PROVIDER_SITE_OTHER): Payer: Managed Care, Other (non HMO) | Admitting: Obstetrics & Gynecology

## 2021-04-09 VITALS — BP 144/81 | HR 75 | Ht 63.0 in | Wt 157.4 lb

## 2021-04-09 DIAGNOSIS — Z1231 Encounter for screening mammogram for malignant neoplasm of breast: Secondary | ICD-10-CM

## 2021-04-09 DIAGNOSIS — Z113 Encounter for screening for infections with a predominantly sexual mode of transmission: Secondary | ICD-10-CM | POA: Insufficient documentation

## 2021-04-09 DIAGNOSIS — Z01419 Encounter for gynecological examination (general) (routine) without abnormal findings: Secondary | ICD-10-CM

## 2021-04-09 DIAGNOSIS — Z3049 Encounter for surveillance of other contraceptives: Secondary | ICD-10-CM | POA: Diagnosis not present

## 2021-04-09 DIAGNOSIS — R232 Flushing: Secondary | ICD-10-CM | POA: Diagnosis not present

## 2021-04-09 LAB — CBC
Hematocrit: 44.6 % (ref 34.0–46.6)
Hemoglobin: 15.2 g/dL (ref 11.1–15.9)
MCH: 29.1 pg (ref 26.6–33.0)
MCHC: 34.1 g/dL (ref 31.5–35.7)
MCV: 85 fL (ref 79–97)
Platelets: 367 10*3/uL (ref 150–450)
RBC: 5.22 x10E6/uL (ref 3.77–5.28)
RDW: 13 % (ref 11.7–15.4)
WBC: 8.5 10*3/uL (ref 3.4–10.8)

## 2021-04-09 LAB — HEMOGLOBIN A1C
Est. average glucose Bld gHb Est-mCnc: 111 mg/dL
Hgb A1c MFr Bld: 5.5 % (ref 4.8–5.6)

## 2021-04-09 MED ORDER — ETONOGESTREL-ETHINYL ESTRADIOL 0.12-0.015 MG/24HR VA RING: VAGINAL_RING | VAGINAL | 12 refills | Status: DC

## 2021-04-09 MED ORDER — GABAPENTIN 600 MG PO TABS: 600.0000 mg | ORAL_TABLET | Freq: Every day | ORAL | 3 refills | Status: DC

## 2021-04-09 NOTE — Progress Notes (Signed)
GYNECOLOGY ANNUAL PREVENTATIVE CARE ENCOUNTER NOTE  History:     [name] [name] is a 50 y.o. G1P1 female here for a routine annual gynecologic exam.  Current complaints: bothersome hot flashes/night sweats.  Thinks she is going through menopause. No periods as she is on continuous Nuvaring.   Denies abnormal vaginal bleeding, discharge, pelvic pain, problems with intercourse or other gynecologic concerns.    Gynecologic History No LMP recorded. (Menstrual status: Other). Contraception: NuvaRing vaginal inserts Last Pap: 02/09/2020. Result was abnormal: ASCUS with positive HRHPV 18/45; benign colposcopy. Last Mammogram: 04/13/2020.  Result was normal Last Colonoscopy: 10/01/2020.  Result was benign polyps, needs to repeat in 5 years. Done at [facility].  Obstetric History OB History  Gravida Para Term Preterm AB Living  1 1       1   SAB IAB Ectopic Multiple Live Births          1    # Outcome Date GA Lbr Len/2nd Weight Sex Delivery Anes PTL Lv  1 Para 1995    M CS-LTranv   LIV    Past Medical History:  Diagnosis Date   Allergy    Arthritis    Asthma    Constipation    Endometriosis    Uses Nuvaring continuously   Headache    Heart murmur    Ulcer     Past Surgical History:  Procedure Laterality Date   ANKLE SURGERY Right 06/11/2017   APPENDECTOMY  2008   CESAREAN SECTION  1995   CHOLECYSTECTOMY  2000   LAPAROSCOPIC ENDOMETRIOSIS FULGURATION     spinal tap     WISDOM TOOTH EXTRACTION     x2    Current Outpatient Medications on File Prior to Visit  Medication Sig Dispense Refill   albuterol (VENTOLIN HFA) 108 (90 Base) MCG/ACT inhaler Inhale 2 puffs into the lungs every 6 (six) hours as needed for wheezing or shortness of breath. 18 g 1   Azelastine HCl 0.15 % SOLN Place 1 spray into the nose daily. 90 mL 1   Cetirizine HCl 10 MG CAPS Take 10 mg by mouth daily.     cyclobenzaprine (FLEXERIL) 10 MG tablet Take 1 tablet (10 mg total) by mouth every 8 (eight) hours as needed  for muscle spasms. 30 tablet 1   desonide (DESOWEN) 0.05 % cream Apply topically 2 (two) times daily. 60 g 0   EUCRISA 2 % OINT APPLY TO THE AFFECTED AREAS TWICE DAILY AS NEEDED UNTIL SMOOTH CLEAR     fluticasone (FLONASE) 50 MCG/ACT nasal spray Place 2 sprays into both nostrils daily. 48 g 1   fluticasone furoate-vilanterol (BREO ELLIPTA) 100-25 MCG/INH AEPB Inhale 1 puff by mouth once daily 60 each 5   ibuprofen (ADVIL) 800 MG tablet Take 1 tablet (800 mg total) by mouth every 8 (eight) hours as needed. 60 tablet 1   montelukast (SINGULAIR) 10 MG tablet Take 1 tablet by mouth once daily 90 tablet 0   naproxen (NAPROSYN) 500 MG tablet Take 1 tablet (500 mg total) by mouth 2 (two) times daily with a meal. As needed for pain 30 tablet 2   nystatin cream (MYCOSTATIN) APPLY  CREAM TOPICALLY TWICE DAILY TO AFFECTED AREA 30 g 0   Spacer/Aero-Holding Chambers DEVI 1 each by Does not apply route daily. 1 each 0   SUMAtriptan (IMITREX) 100 MG tablet Take 100 mg by mouth 2 (two) times daily as needed.     Tiotropium Bromide Monohydrate (SPIRIVA RESPIMAT) 1.25 MCG/ACT  AERS Inhale 2 puffs into the lungs daily. 4 g 0   Tiotropium Bromide Monohydrate (SPIRIVA RESPIMAT) 1.25 MCG/ACT AERS Inhale 2 puffs into the lungs daily. 4 g 0   triamcinolone cream (KENALOG) 0.1 % Apply topically 2 (two) times daily. 453.6 g 0   No current facility-administered medications on file prior to visit.    Allergies  Allergen Reactions   Symbicort [Budesonide-Formoterol Fumarate] Itching   Mosquito (Culex Pipiens) Allergy Skin Test Swelling   Mosquito (Diagnostic) Swelling    Social History:  reports that she has never smoked. She has never used smokeless tobacco. She reports that she does not drink alcohol and does not use drugs.  Family History  Problem Relation Age of Onset   COPD Mother    Emphysema Mother    Cancer Father        unknown   Cancer Paternal Aunt        ovarian   Breast cancer Neg Hx     The  following portions of the patient's history were reviewed and updated as appropriate: allergies, current medications, past family history, past medical history, past social history, past surgical history and problem list.  Review of Systems Pertinent items noted in HPI and remainder of comprehensive ROS otherwise negative.  Physical Exam:  BP (!) 144/81   Pulse 75   Ht 5\' 3"  (1.6 m)   Wt 157 lb 6.4 oz (71.4 kg)   BMI 27.88 kg/m  CONSTITUTIONAL: Well-developed, well-nourished female in no acute distress.  HENT:  Normocephalic, atraumatic, External right and left ear normal.  EYES: Conjunctivae and EOM are normal. Pupils are equal, round, and reactive to light. No scleral icterus.  NECK: Normal range of motion, supple, no masses.  Normal thyroid.  SKIN: Skin is warm and dry. No rash noted. Not diaphoretic. No erythema. No pallor. MUSCULOSKELETAL: Normal range of motion. No tenderness.  No cyanosis, clubbing, or edema. NEUROLOGIC: Alert and oriented to person, place, and time. Normal reflexes, muscle tone coordination.  PSYCHIATRIC: Normal mood and affect. Normal behavior. Normal judgment and thought content. CARDIOVASCULAR: Normal heart rate noted, regular rhythm RESPIRATORY: Clear to auscultation bilaterally. Effort and breath sounds normal, no problems with respiration noted. BREASTS: Symmetric in size. No masses, tenderness, skin changes, nipple drainage, or lymphadenopathy bilaterally. Performed in the presence of a chaperone. ABDOMEN: Soft, no distention noted.  No tenderness, rebound or guarding.  PELVIC: Normal appearing external genitalia and urethral meatus; normal appearing vaginal mucosa and cervix.  No abnormal vaginal discharge noted.  Nuvaring in place. Pap smear obtained.  Normal uterine size, no other palpable masses, no uterine or adnexal tenderness.  Performed in the presence of a chaperone.   Assessment and Plan:    1. Breast cancer screening by mammogram Mammogram  scheduled - MM 3D SCREEN BREAST BILATERAL; Future  2. Hot flashes Will check FSH.  Neurontin prescribed for hot flashes. Will monitor response. - Follicle stimulating hormone - gabapentin (NEURONTIN) 600 MG tablet; Take 1 tablet (600 mg total) by mouth at bedtime.  Dispense: 30 tablet; Refill: 3  3. Encounter for surveillance of nuvaring Nuvaring refilled. - etonogestrel-ethinyl estradiol (NUVARING) 0.12-0.015 MG/24HR vaginal ring; INSERT ONE RING VAGINALLY EVERY 28 DAYS. USE IN CONTINUOUS FASHION FOR ENDOMETRIOSIS.  Dispense: 3 each; Refill: 12  4. Well woman exam with routine gynecological exam - Cytology - PAP - Hepatitis B surface antigen - Hepatitis C antibody - HIV Antibody (routine testing w rflx) - RPR - CBC - Comprehensive metabolic panel - Lipid  panel - Hemoglobin A1c - TSH Will follow up results of pap smear and non-fasting labs and manage accordingly. Colon cancer screening is up to date Routine preventative health maintenance measures emphasized. Please refer to After Visit Summary for other counseling recommendations.      Jaynie Collins, MD, FACOG Obstetrician & Gynecologist, [facility] for [employer], [facility]

## 2021-04-09 NOTE — Patient Instructions (Signed)
Preventive Care 50-50 Years Old, Female Preventive care refers to lifestyle choices and visits with your health care provider that can promote health and wellness. This includes: A yearly physical exam. This is also called an annual wellness visit. Regular dental and eye exams. Immunizations. Screening for certain conditions. Healthy lifestyle choices, such as: Eating a healthy diet. Getting regular exercise. Not using drugs or products that contain nicotine and tobacco. Limiting alcohol use. What can I expect for my preventive care visit? Physical exam Your health care provider will check your: Height and weight. These may be used to calculate your BMI (body mass index). BMI is a measurement that tells if you are at a healthy weight. Heart rate and blood pressure. Body temperature. Skin for abnormal spots. Counseling Your health care provider may ask you questions about your: Past medical problems. Family's medical history. Alcohol, tobacco, and drug use. Emotional well-being. Home life and relationship well-being. Sexual activity. Diet, exercise, and sleep habits. Work and work Statistician. Access to firearms. Method of birth control. Menstrual cycle. Pregnancy history. What immunizations do I need?  Vaccines are usually given at various ages, according to a schedule. Your health care provider will recommend vaccines for you based on your age, medicalhistory, and lifestyle or other factors, such as travel or where you work. What tests do I need? Blood tests Lipid and cholesterol levels. These may be checked every 5 years, or more often if you are over 50 years old. Hepatitis C test. Hepatitis B test. Screening Lung cancer screening. You may have this screening every year starting at age 50 if you have a 30-pack-year history of smoking and currently smoke or have quit within the past 15 years. Colorectal cancer screening. All adults should have this screening starting at  age 50 and continuing until age 50. Your health care provider may recommend screening at age 50 if you are at increased risk. You will have tests every 1-10 years, depending on your results and the type of screening test. Diabetes screening. This is done by checking your blood sugar (glucose) after you have not eaten for a while (fasting). You may have this done every 1-3 years. Mammogram. This may be done every 1-2 years. Talk with your health care provider about when you should start having regular mammograms. This may depend on whether you have a family history of breast cancer. BRCA-related cancer screening. This may be done if you have a family history of breast, ovarian, tubal, or peritoneal cancers. Pelvic exam and Pap test. This may be done every 3 years starting at age 79. Starting at age 50, this may be done every 5 years if you have a Pap test in combination with an HPV test. Other tests STD (sexually transmitted disease) testing, if you are at risk. Bone density scan. This is done to screen for osteoporosis. You may have this scan if you are at high risk for osteoporosis. Talk with your health care provider about your test results, treatment options,and if necessary, the need for more tests. Follow these instructions at home: Eating and drinking  Eat a diet that includes fresh fruits and vegetables, whole grains, lean protein, and low-fat dairy products. Take vitamin and mineral supplements as recommended by your health care provider. Do not drink alcohol if: Your health care provider tells you not to drink. You are pregnant, may be pregnant, or are planning to become pregnant. If you drink alcohol: Limit how much you have to 0-1 drink a day. Be aware  of how much alcohol is in your drink. In the [place] one drink equals one 12 oz bottle of beer (355 mL), one 5 oz glass of wine (148 mL), or one 1 oz glass of hard liquor (44 mL).  Lifestyle Take daily care of your teeth and  gums. Brush your teeth every morning and night with fluoride toothpaste. Floss one time each day. Stay active. Exercise for at least 30 minutes 5 or more days each week. Do not use any products that contain nicotine or tobacco, such as cigarettes, e-cigarettes, and chewing tobacco. If you need help quitting, ask your health care provider. Do not use drugs. If you are sexually active, practice safe sex. Use a condom or other form of protection to prevent STIs (sexually transmitted infections). If you do not wish to become pregnant, use a form of birth control. If you plan to become pregnant, see your health care provider for a prepregnancy visit. If told by your health care provider, take low-dose aspirin daily starting at age 50. Find healthy ways to cope with stress, such as: Meditation, yoga, or listening to music. Journaling. Talking to a trusted person. Spending time with friends and family. Safety Always wear your seat belt while driving or riding in a vehicle. Do not drive: If you have been drinking alcohol. Do not ride with someone who has been drinking. When you are tired or distracted. While texting. Wear a helmet and other protective equipment during sports activities. If you have firearms in your house, make sure you follow all gun safety procedures. What's next? Visit your health care provider once a year for an annual wellness visit. Ask your health care provider how often you should have your eyes and teeth checked. Stay up to date on all vaccines. This information is not intended to replace advice given to you by your health care provider. Make sure you discuss any questions you have with your healthcare provider. Document Revised: 05/01/2020 Document Reviewed: 04/08/2018 Elsevier Patient Education  2022 [employer].

## 2021-04-10 LAB — LIPID PANEL
Chol/HDL Ratio: 2.5 ratio (ref 0.0–4.4)
Cholesterol, Total: 175 mg/dL (ref 100–199)
HDL: 69 mg/dL (ref >39)
LDL Chol Calc (NIH): 89 mg/dL (ref 0–99)
Triglycerides: 97 mg/dL (ref 0–149)
VLDL Cholesterol Cal: 17 mg/dL (ref 5–40)

## 2021-04-10 LAB — COMPREHENSIVE METABOLIC PANEL
ALT: 11 IU/L (ref 0–32)
AST: 13 IU/L (ref 0–40)
Albumin/Globulin Ratio: 1.3 (ref 1.2–2.2)
Albumin: 3.9 g/dL (ref 3.8–4.8)
Alkaline Phosphatase: 94 IU/L (ref 44–121)
BUN/Creatinine Ratio: 15 (ref 9–23)
BUN: 11 mg/dL (ref 6–24)
Bilirubin Total: <0.2 mg/dL (ref 0.0–1.2)
CO2: 23 mmol/L (ref 20–29)
Calcium: 9.5 mg/dL (ref 8.7–10.2)
Chloride: 103 mmol/L (ref 96–106)
Creatinine, Ser: 0.74 mg/dL (ref 0.57–1.00)
Globulin, Total: 3.1 g/dL (ref 1.5–4.5)
Glucose: 75 mg/dL (ref 65–99)
Potassium: 4.1 mmol/L (ref 3.5–5.2)
Sodium: 140 mmol/L (ref 134–144)
Total Protein: 7 g/dL (ref 6.0–8.5)
eGFR: 99 mL/min/{1.73_m2} (ref >59)

## 2021-04-10 LAB — HEPATITIS C ANTIBODY: Hep C Virus Ab: <0.1 s/co ratio (ref 0.0–0.9)

## 2021-04-10 LAB — RPR: RPR Ser Ql: NONREACTIVE

## 2021-04-10 LAB — TSH: TSH: 1.45 u[IU]/mL (ref 0.450–4.500)

## 2021-04-10 LAB — HEPATITIS B SURFACE ANTIGEN: Hepatitis B Surface Ag: NEGATIVE

## 2021-04-10 LAB — FOLLICLE STIMULATING HORMONE: FSH: 0.3 m[IU]/mL

## 2021-04-10 LAB — HIV ANTIBODY (ROUTINE TESTING W REFLEX): HIV Screen 4th Generation wRfx: NONREACTIVE

## 2021-04-11 ENCOUNTER — Other Ambulatory Visit: Payer: Self-pay

## 2021-04-11 ENCOUNTER — Other Ambulatory Visit: Payer: Managed Care, Other (non HMO)

## 2021-04-11 DIAGNOSIS — Z01419 Encounter for gynecological examination (general) (routine) without abnormal findings: Secondary | ICD-10-CM

## 2021-04-11 NOTE — Progress Notes (Signed)
Pt wanted to repeat CMP and lipid panel since she was not fasting last time. Also would like to recheck her BP.   Pt made aware that her insurance may deny the repeat labs as her previous labs where normal.   BP in office today 119/75

## 2021-04-11 NOTE — Progress Notes (Signed)
Patient was assessed and managed by nursing staff during this encounter. I have reviewed the chart and agree with the documentation and plan.   Jaynie Collins, MD 04/11/2021 9:05 AM

## 2021-04-12 LAB — CYTOLOGY - PAP
Chlamydia: NEGATIVE
Comment: NEGATIVE
Comment: NEGATIVE
Comment: NEGATIVE
Comment: NORMAL
Diagnosis: NEGATIVE
Diagnosis: REACTIVE
High risk HPV: NEGATIVE
Neisseria Gonorrhea: NEGATIVE
Trichomonas: NEGATIVE

## 2021-04-12 LAB — COMPREHENSIVE METABOLIC PANEL
ALT: 9 IU/L (ref 0–32)
AST: 10 IU/L (ref 0–40)
Albumin/Globulin Ratio: 1.3 (ref 1.2–2.2)
Albumin: 3.6 g/dL — ABNORMAL LOW (ref 3.8–4.8)
Alkaline Phosphatase: 81 IU/L (ref 44–121)
BUN/Creatinine Ratio: 18 (ref 9–23)
BUN: 12 mg/dL (ref 6–24)
Bilirubin Total: 0.4 mg/dL (ref 0.0–1.2)
CO2: 19 mmol/L — ABNORMAL LOW (ref 20–29)
Calcium: 8.7 mg/dL (ref 8.7–10.2)
Chloride: 106 mmol/L (ref 96–106)
Creatinine, Ser: 0.68 mg/dL (ref 0.57–1.00)
Globulin, Total: 2.8 g/dL (ref 1.5–4.5)
Glucose: 91 mg/dL (ref 65–99)
Potassium: 4 mmol/L (ref 3.5–5.2)
Sodium: 139 mmol/L (ref 134–144)
Total Protein: 6.4 g/dL (ref 6.0–8.5)
eGFR: 106 mL/min/{1.73_m2} (ref >59)

## 2021-04-12 LAB — LIPID PANEL
Chol/HDL Ratio: 2.9 ratio (ref 0.0–4.4)
Cholesterol, Total: 165 mg/dL (ref 100–199)
HDL: 57 mg/dL (ref >39)
LDL Chol Calc (NIH): 90 mg/dL (ref 0–99)
Triglycerides: 99 mg/dL (ref 0–149)
VLDL Cholesterol Cal: 18 mg/dL (ref 5–40)

## 2021-04-16 ENCOUNTER — Encounter: Payer: Self-pay | Admitting: Radiology

## 2021-04-22 ENCOUNTER — Other Ambulatory Visit: Payer: Self-pay

## 2021-04-22 ENCOUNTER — Ambulatory Visit
Admission: RE | Admit: 2021-04-22 | Discharge: 2021-04-22 | Disposition: A | Discharge status: Home / Self Care | Payer: Managed Care, Other (non HMO) | Source: Ambulatory Visit | Attending: Obstetrics & Gynecology | Admitting: Obstetrics & Gynecology

## 2021-04-22 DIAGNOSIS — Z1231 Encounter for screening mammogram for malignant neoplasm of breast: Secondary | ICD-10-CM | POA: Diagnosis not present

## 2021-04-22 NOTE — Progress Notes (Signed)
Agree with the details of the visit as noted by Elizabeth Walsh, NP.  C. Laura Makenze Ellett, MD [facility] 

## 2021-05-09 ENCOUNTER — Other Ambulatory Visit: Payer: Managed Care, Other (non HMO)

## 2021-06-25 ENCOUNTER — Telehealth: Payer: Self-pay | Admitting: Obstetrics & Gynecology

## 2021-06-25 ENCOUNTER — Other Ambulatory Visit: Payer: Self-pay | Admitting: Obstetrics & Gynecology

## 2021-06-25 DIAGNOSIS — M545 Low back pain, unspecified: Secondary | ICD-10-CM

## 2021-06-25 DIAGNOSIS — R102 Pelvic and perineal pain: Secondary | ICD-10-CM

## 2021-06-25 MED ORDER — CYCLOBENZAPRINE HCL 10 MG PO TABS: 10.0000 mg | ORAL_TABLET | Freq: Three times a day (TID) | ORAL | 5 refills | Status: DC | PRN

## 2021-06-25 NOTE — Progress Notes (Signed)
Flexeril refilled as per patient's request.   Jaynie Collins, MD

## 2021-06-25 NOTE — Telephone Encounter (Signed)
Patient called requesting refill on her flexiril.

## 2021-06-26 ENCOUNTER — Other Ambulatory Visit: Payer: Self-pay | Admitting: Family Medicine

## 2021-06-26 DIAGNOSIS — J4520 Mild intermittent asthma, uncomplicated: Secondary | ICD-10-CM

## 2021-06-27 ENCOUNTER — Telehealth: Payer: Self-pay

## 2021-06-27 DIAGNOSIS — J4520 Mild intermittent asthma, uncomplicated: Secondary | ICD-10-CM

## 2021-06-27 NOTE — Telephone Encounter (Signed)
denied °

## 2021-07-06 ENCOUNTER — Emergency Department
Admission: EM | Admit: 2021-07-06 | Discharge: 2021-07-06 | Discharge status: Against Medical Advice | Payer: Managed Care, Other (non HMO) | Source: Home / Self Care

## 2021-07-14 ENCOUNTER — Encounter: Payer: Self-pay | Admitting: Obstetrics & Gynecology

## 2021-07-14 DIAGNOSIS — N898 Other specified noninflammatory disorders of vagina: Secondary | ICD-10-CM

## 2021-07-15 MED ORDER — FLUCONAZOLE 150 MG PO TABS: 150.0000 mg | ORAL_TABLET | Freq: Once | ORAL | 0 refills | Status: AC

## 2021-09-12 IMAGING — CT CT MAXILLOFACIAL W/O CM
1 of 2 series · 16 of 30 positions shown, 20 images · non-contrast
Comparison: CT head 05/03/2013

CLINICAL DATA: Cough and sinusitis

EXAM:
CT MAXILLOFACIAL WITHOUT CONTRAST
TECHNIQUE: Multidetector CT images of the paranasal sinuses were obtained using
the standard protocol without intravenous contrast.

[Series 6: (person_name) · axial · 0.33mm/px · z∈[-146,-10]mm · 16 of 153 slices shown, 20 images]
[im 9/153  brain]
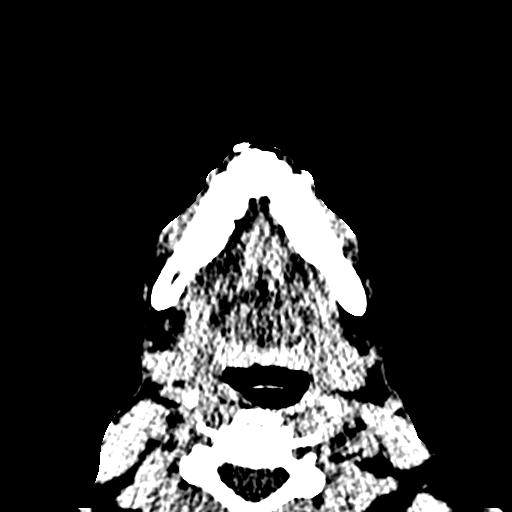
[im 9/153  bone]
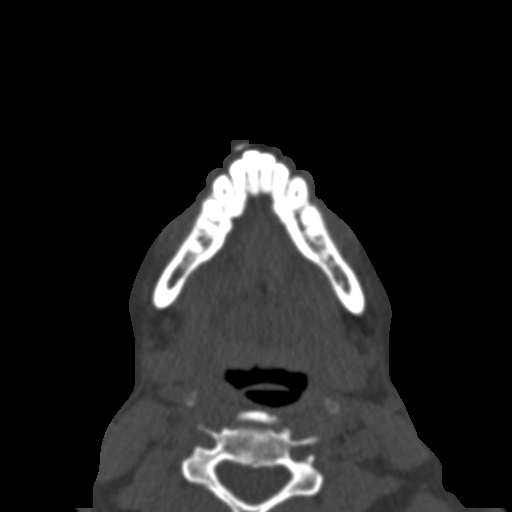
[im 17/153  bone]
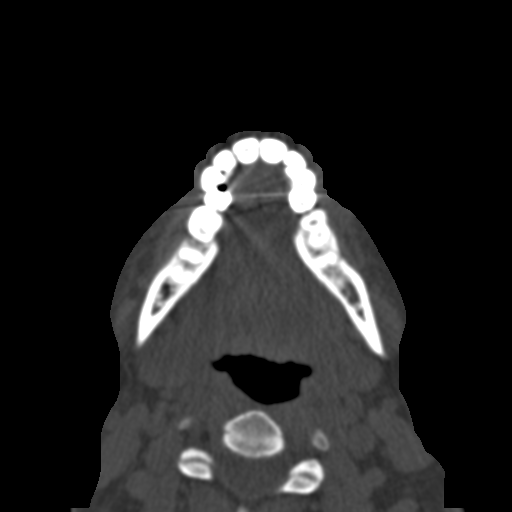
[im 25/153  bone]
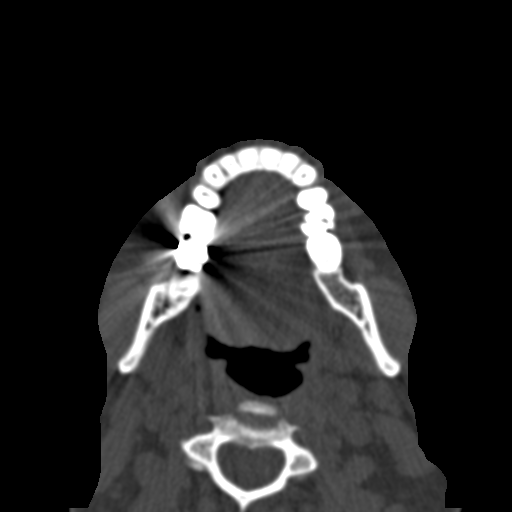
[im 33/153  bone]
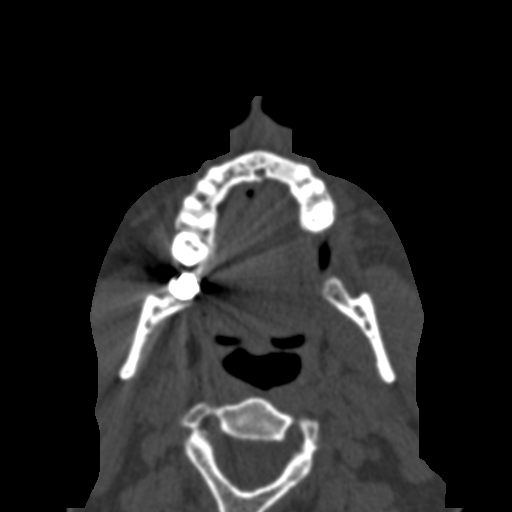
[im 49/153  brain]
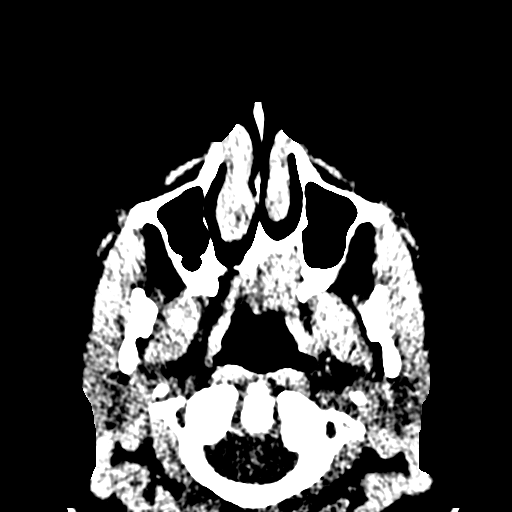
[im 49/153  bone]
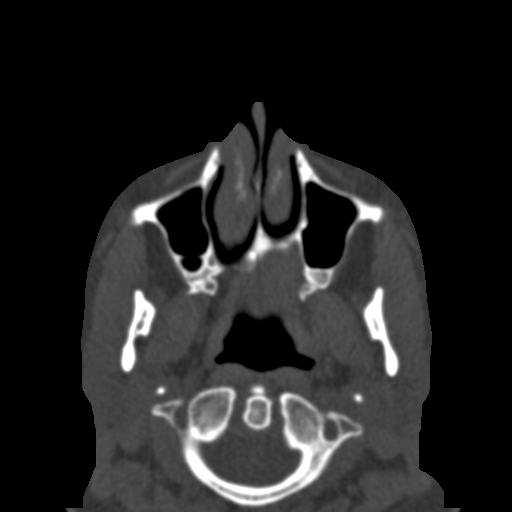
[im 57/153  bone]
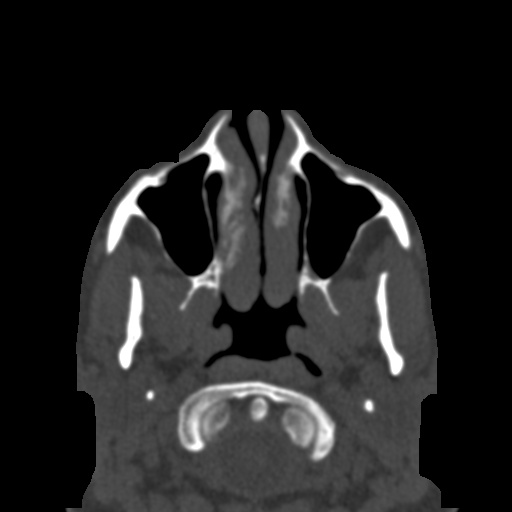
[im 65/153  bone]
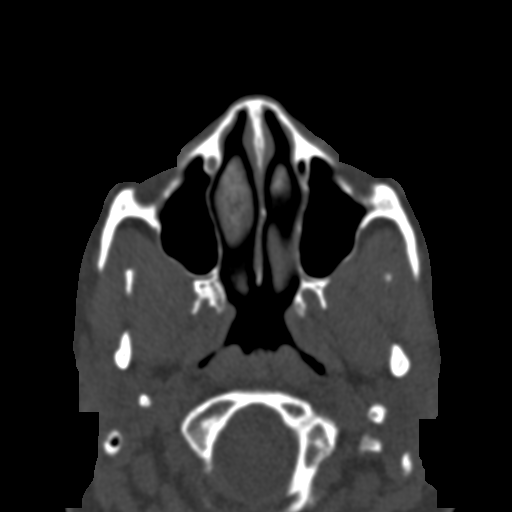
[im 73/153  bone]
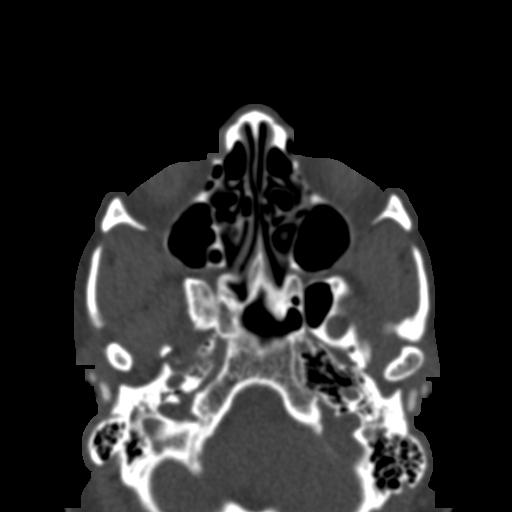
[im 81/153  brain]
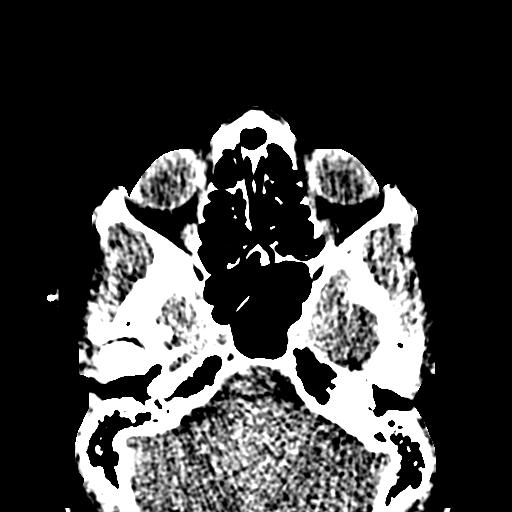
[im 81/153  bone]
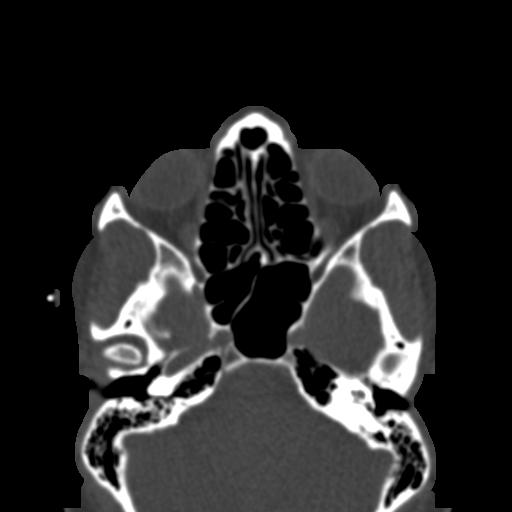
[im 89/153  bone]
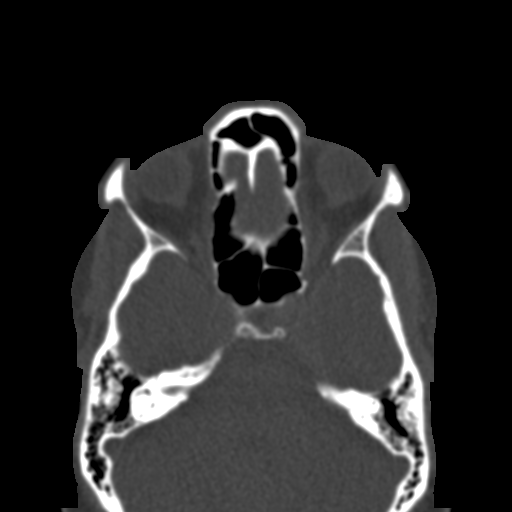
[im 97/153  bone]
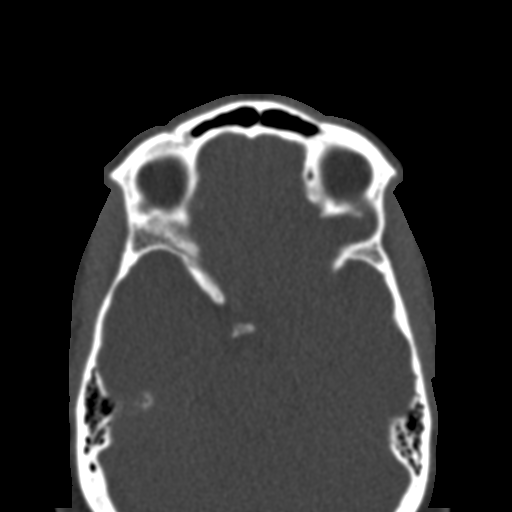
[im 105/153  bone]
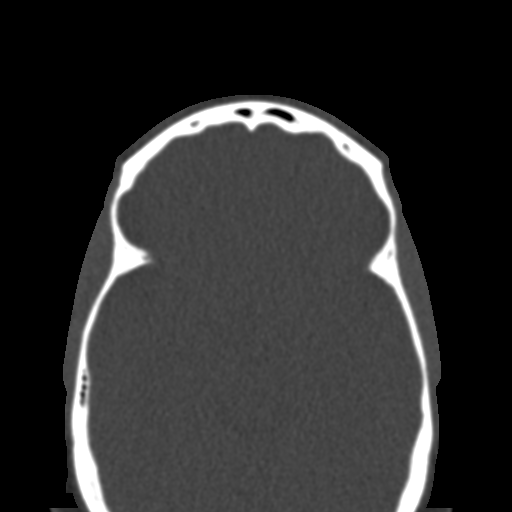
[im 121/153  brain]
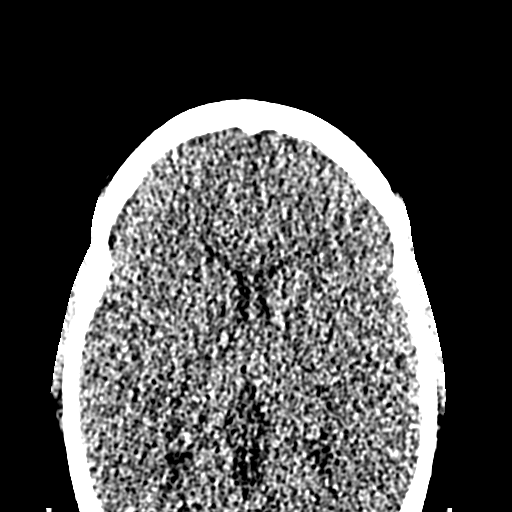
[im 121/153  bone]
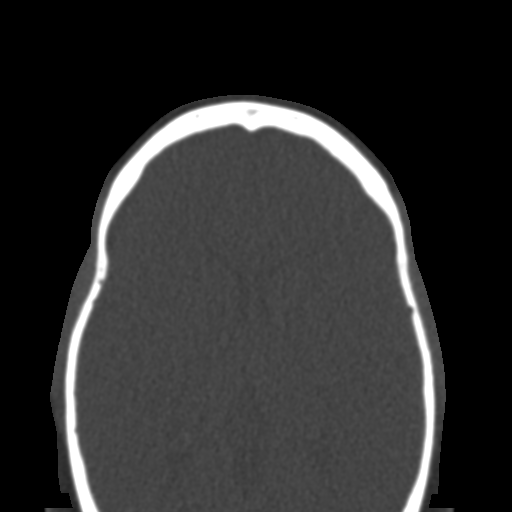
[im 129/153  bone]
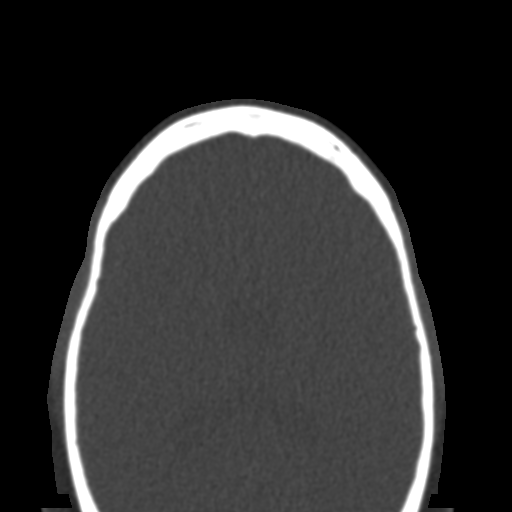
[im 137/153  bone]
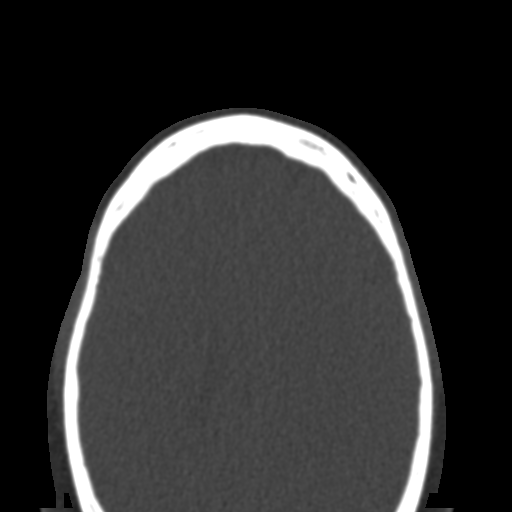
[im 145/153  bone]
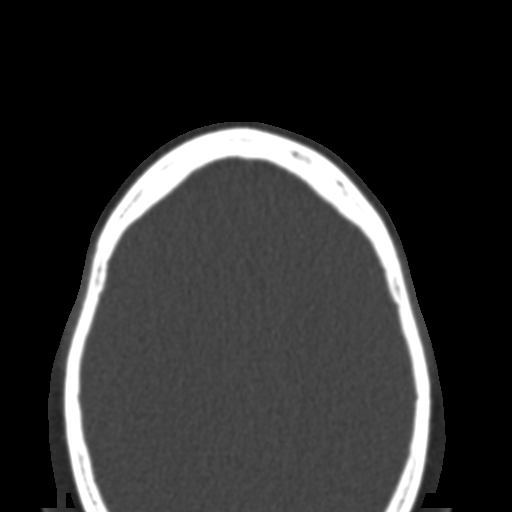

[16 of 30 positions shown; findings below may reference images not displayed]

FINDINGS: Paranasal sinuses:

Frontal: Normally aerated. Patent frontal sinus drainage pathways.

Ethmoid: Normally aerated.

Maxillary: Small area mucosal edema left maxillary sinus. Right
maxillary sinus clear

Sphenoid: Normally aerated. Patent sphenoethmoidal recesses.

Mastoid: Clear bilaterally.

Right ostiomeatal unit: Patent.

Left ostiomeatal unit: Patent.

Nasal passages: Patent. Mild septal deviation to the left

Anatomy: Keros type 1 olfactory recess

Limited intracranial imaging negative

Negative orbit bilaterally.
IMPRESSION: Mild mucosal edema left maxillary sinus. Remaining sinuses clear.
Nasal drainage pathways clear.

## 2021-12-04 ENCOUNTER — Other Ambulatory Visit: Payer: Self-pay | Admitting: Obstetrics & Gynecology

## 2021-12-04 DIAGNOSIS — B372 Candidiasis of skin and nail: Secondary | ICD-10-CM

## 2021-12-05 MED ORDER — NYSTATIN 100000 UNIT/GM EX CREA: TOPICAL_CREAM | Freq: Two times a day (BID) | CUTANEOUS | 0 refills | Status: AC

## 2021-12-09 ENCOUNTER — Encounter: Payer: Self-pay | Admitting: Obstetrics & Gynecology

## 2021-12-10 ENCOUNTER — Other Ambulatory Visit: Payer: Self-pay | Admitting: *Deleted

## 2021-12-10 DIAGNOSIS — Z3049 Encounter for surveillance of other contraceptives: Secondary | ICD-10-CM

## 2021-12-10 MED ORDER — ETONOGESTREL-ETHINYL ESTRADIOL 0.12-0.015 MG/24HR VA RING: VAGINAL_RING | VAGINAL | 12 refills | Status: DC

## 2021-12-24 ENCOUNTER — Telehealth: Payer: Self-pay | Admitting: *Deleted

## 2021-12-24 NOTE — Telephone Encounter (Signed)
Left message for pt and will call her back tomorrow ?

## 2021-12-24 NOTE — Telephone Encounter (Signed)
-----   Message from Alvin Critchley sent at 12/24/2021  9:35 AM EDT ----- ?Regarding: Nurse Call ?Pt called in stating she has cramps and she doesn't usually have them She wants to makes sure everything is ok, not sure where cramps are coming from. The pain level is around 8. She wants to speak to a nurse.  ? ?

## 2022-02-19 ENCOUNTER — Ambulatory Visit: Payer: Managed Care, Other (non HMO) | Admitting: Podiatry

## 2022-02-24 ENCOUNTER — Ambulatory Visit (INDEPENDENT_AMBULATORY_CARE_PROVIDER_SITE_OTHER): Payer: Managed Care, Other (non HMO) | Admitting: Podiatry

## 2022-02-24 ENCOUNTER — Ambulatory Visit (INDEPENDENT_AMBULATORY_CARE_PROVIDER_SITE_OTHER): Payer: Managed Care, Other (non HMO)

## 2022-02-24 ENCOUNTER — Encounter: Payer: Self-pay | Admitting: Podiatry

## 2022-02-24 DIAGNOSIS — M7751 Other enthesopathy of right foot: Secondary | ICD-10-CM | POA: Diagnosis not present

## 2022-02-24 DIAGNOSIS — M7750 Other enthesopathy of unspecified foot: Secondary | ICD-10-CM

## 2022-02-24 DIAGNOSIS — M7752 Other enthesopathy of left foot: Secondary | ICD-10-CM

## 2022-02-24 MED ORDER — TRIAMCINOLONE ACETONIDE 40 MG/ML IJ SUSP
20.0000 mg | Freq: Once | INTRAMUSCULAR | Status: AC
Start: 2022-02-24 — End: 2022-02-24
  Administered 2022-02-24: 20 mg

## 2022-02-24 NOTE — Progress Notes (Signed)
Subjective:  Patient ID: [name] [name], female    DOB: 05/31/1971,  MRN: [id number] HPI Chief Complaint  Patient presents with   Foot Pain    Lateral foot/ankle left - aching, "catching" x several weeks, no injury, notice hallux is curling making it difficult to wear shoes comfortably  Right - previous ankle/foot surgery - still having some occasional pains and concerns   New Patient (Initial Visit)    Est pt 2018    51 y.o. female presents with the above complaint.   ROS: Denies fever chills nausea vomiting muscle aches pains calf pain back pain chest pain shortness of breath.  Past Medical History:  Diagnosis Date   Allergy    Arthritis    Asthma    Constipation    Endometriosis    Uses Nuvaring continuously   Headache    Heart murmur    Ulcer    Past Surgical History:  Procedure Laterality Date   ANKLE SURGERY Right 06/11/2017   APPENDECTOMY  2008   CESAREAN SECTION  1995   CHOLECYSTECTOMY  2000   LAPAROSCOPIC ENDOMETRIOSIS FULGURATION     spinal tap     WISDOM TOOTH EXTRACTION     x2    Current Outpatient Medications:    albuterol (VENTOLIN HFA) 108 (90 Base) MCG/ACT inhaler, Inhale 2 puffs into the lungs every 6 (six) hours as needed for wheezing or shortness of breath., Disp: 18 g, Rfl: 1   Azelastine HCl 0.15 % SOLN, Place 1 spray into the nose daily., Disp: 90 mL, Rfl: 1   Cetirizine HCl 10 MG CAPS, Take 10 mg by mouth daily., Disp: , Rfl:    cyclobenzaprine (FLEXERIL) 10 MG tablet, Take 1 tablet (10 mg total) by mouth every 8 (eight) hours as needed for muscle spasms., Disp: 30 tablet, Rfl: 5   desonide (DESOWEN) 0.05 % cream, Apply topically 2 (two) times daily., Disp: 60 g, Rfl: 0   etonogestrel-ethinyl estradiol (NUVARING) 0.12-0.015 MG/24HR vaginal ring, INSERT ONE RING VAGINALLY EVERY 28 DAYS. USE IN CONTINUOUS FASHION FOR ENDOMETRIOSIS., Disp: 3 each, Rfl: 12   EUCRISA 2 % OINT, APPLY TO THE AFFECTED AREAS TWICE DAILY AS NEEDED UNTIL SMOOTH CLEAR, Disp:  , Rfl:    fluticasone (FLONASE) 50 MCG/ACT nasal spray, Place 2 sprays into both nostrils daily., Disp: 48 g, Rfl: 1   fluticasone furoate-vilanterol (BREO ELLIPTA) 100-25 MCG/INH AEPB, Inhale 1 puff by mouth once daily, Disp: 60 each, Rfl: 5   ibuprofen (ADVIL) 800 MG tablet, Take 1 tablet (800 mg total) by mouth every 8 (eight) hours as needed., Disp: 60 tablet, Rfl: 1   montelukast (SINGULAIR) 10 MG tablet, Take 1 tablet by mouth once daily, Disp: 90 tablet, Rfl: 0   nystatin cream (MYCOSTATIN), Apply topically 2 (two) times daily., Disp: 30 g, Rfl: 0   pantoprazole (PROTONIX) 40 MG tablet, Take 40 mg by mouth 2 (two) times daily., Disp: , Rfl:    Spacer/Aero-Holding Chambers DEVI, 1 each by Does not apply route daily., Disp: 1 each, Rfl: 0   sucralfate (CARAFATE) 1 GM/10ML suspension, Take 1 g by mouth 4 (four) times daily., Disp: , Rfl:    SUMAtriptan (IMITREX) 100 MG tablet, Take 100 mg by mouth 2 (two) times daily as needed., Disp: , Rfl:    Tiotropium Bromide Monohydrate (SPIRIVA RESPIMAT) 1.25 MCG/ACT AERS, Inhale 2 puffs into the lungs daily., Disp: 4 g, Rfl: 0   Tiotropium Bromide Monohydrate (SPIRIVA RESPIMAT) 1.25 MCG/ACT AERS, Inhale 2 puffs into the  lungs daily., Disp: 4 g, Rfl: 0   triamcinolone cream (KENALOG) 0.1 %, Apply topically 2 (two) times daily., Disp: 453.6 g, Rfl: 0  Allergies  Allergen Reactions   Symbicort [Budesonide-Formoterol Fumarate] Itching   Mosquito (Culex Pipiens) Allergy Skin Test Swelling   Mosquito (Diagnostic) Swelling   Review of Systems Objective:  There were no vitals filed for this visit.  General: Well developed, nourished, in no acute distress, alert and oriented x3   Dermatological: Skin is warm, dry and supple bilateral. Nails x 10 are well maintained; remaining integument appears unremarkable at this time. There are no open sores, no preulcerative lesions, no rash or signs of infection present.  Vascular: Dorsalis Pedis artery and  Posterior Tibial artery pedal pulses are 2/4 bilateral with immedate capillary fill time. Pedal hair growth present. No varicosities and no lower extremity edema present bilateral.   Neruologic: Grossly intact via light touch bilateral. Vibratory intact via tuning fork bilateral. Protective threshold with Semmes Wienstein monofilament intact to all pedal sites bilateral. Patellar and Achilles deep tendon reflexes 2+ bilateral. No Babinski or clonus noted bilateral.   Musculoskeletal: No gross boney pedal deformities bilateral. No pain, crepitus, or limitation noted with foot and ankle range of motion bilateral. Muscular strength 5/5 in all groups tested bilateral.  Flexible hammertoe deformities 2 through 5 bilaterally hallux malleus right with rigidity at the interphalangeal joint.  Left foot does demonstrate subtalar joint pain on palpation of the sinus tarsi and the end range of motion of that joint. Gait: Unassisted, Nonantalgic.    Radiographs:  Radiographs taken today demonstrate an osseously mature individual with a hammertoe deformity 2 through 5 also hallux malleus right great toe joint space narrowing midfoot and rear foot as well as the ankle joints bilaterally.  No acute findings.  Assessment & Plan:   Assessment: Subtalar joint capsulitis left foot and ankle capsulitis.  Hallux malleus right. Plan: Discussed etiology pathology conservative versus surgical therapies discussed topical anti-inflammatories.  We also injected subtalar joint left.  She will continue to wear her good shoes with her inserts.  I will follow-up with her on an as-needed basis.  We did discuss the possible need for surgical intervention regarding the right great toe.     [name] [name], [place]

## 2022-02-25 ENCOUNTER — Other Ambulatory Visit (HOSPITAL_COMMUNITY)
Admission: RE | Admit: 2022-02-25 | Discharge: 2022-02-25 | Disposition: A | Discharge status: Home / Self Care | Payer: Managed Care, Other (non HMO) | Source: Ambulatory Visit | Attending: Obstetrics & Gynecology | Admitting: Obstetrics & Gynecology

## 2022-02-25 ENCOUNTER — Encounter: Payer: Self-pay | Admitting: Obstetrics & Gynecology

## 2022-02-25 ENCOUNTER — Ambulatory Visit (INDEPENDENT_AMBULATORY_CARE_PROVIDER_SITE_OTHER): Payer: Managed Care, Other (non HMO) | Admitting: Obstetrics & Gynecology

## 2022-02-25 VITALS — BP 124/82 | HR 79 | Ht 63.0 in | Wt 161.0 lb

## 2022-02-25 DIAGNOSIS — R87610 Atypical squamous cells of undetermined significance on cytologic smear of cervix (ASC-US): Secondary | ICD-10-CM | POA: Insufficient documentation

## 2022-02-25 DIAGNOSIS — Z113 Encounter for screening for infections with a predominantly sexual mode of transmission: Secondary | ICD-10-CM

## 2022-02-25 DIAGNOSIS — Z1231 Encounter for screening mammogram for malignant neoplasm of breast: Secondary | ICD-10-CM | POA: Diagnosis not present

## 2022-02-25 DIAGNOSIS — Z3049 Encounter for surveillance of other contraceptives: Secondary | ICD-10-CM | POA: Diagnosis not present

## 2022-02-25 DIAGNOSIS — R87810 Cervical high risk human papillomavirus (HPV) DNA test positive: Secondary | ICD-10-CM

## 2022-02-25 DIAGNOSIS — Z01419 Encounter for gynecological examination (general) (routine) without abnormal findings: Secondary | ICD-10-CM

## 2022-02-25 MED ORDER — ETONOGESTREL-ETHINYL ESTRADIOL 0.12-0.015 MG/24HR VA RING: VAGINAL_RING | VAGINAL | 12 refills | Status: DC

## 2022-02-25 NOTE — Progress Notes (Signed)
GYNECOLOGY ANNUAL PREVENTATIVE CARE ENCOUNTER NOTE  History:     [name] [name] is a 51 y.o. G1P1 female here for a routine annual gynecologic exam.  Current complaints: none.  Desires annual preventative health maintenance and STI screening labs., Also desires refill for her continuous Nuvaring, prescribed for management of her endometriosis.   Denies abnormal vaginal bleeding, discharge, pelvic pain, problems with intercourse or other gynecologic concerns.    Gynecologic History No LMP recorded. (Menstrual status: Other). Contraception: NuvaRing vaginal inserts Last Pap: 04/09/2021. Result was normal with negative HPV Last Mammogram: 04/22/2021.  Result was normal Last Colonoscopy: 10/01/2020.  Result was normal  Obstetric History OB History  Gravida Para Term Preterm AB Living  1 1       1   SAB IAB Ectopic Multiple Live Births          1    # Outcome Date GA Lbr Len/2nd Weight Sex Delivery Anes PTL Lv  1 Para 1995    M CS-LTranv   LIV    Past Medical History:  Diagnosis Date   Allergy    Arthritis    Asthma    Constipation    Endometriosis    Uses Nuvaring continuously   Headache    Heart murmur    Ulcer     Past Surgical History:  Procedure Laterality Date   ANKLE SURGERY Right 06/11/2017   APPENDECTOMY  2008   CESAREAN SECTION  1995   CHOLECYSTECTOMY  2000   LAPAROSCOPIC ENDOMETRIOSIS FULGURATION     spinal tap     WISDOM TOOTH EXTRACTION     x2    Current Outpatient Medications on File Prior to Visit  Medication Sig Dispense Refill   albuterol (VENTOLIN HFA) 108 (90 Base) MCG/ACT inhaler Inhale 2 puffs into the lungs every 6 (six) hours as needed for wheezing or shortness of breath. 18 g 1   Azelastine HCl 0.15 % SOLN Place 1 spray into the nose daily. 90 mL 1   Cetirizine HCl 10 MG CAPS Take 10 mg by mouth daily.     cyclobenzaprine (FLEXERIL) 10 MG tablet Take 1 tablet (10 mg total) by mouth every 8 (eight) hours as needed for muscle spasms. 30 tablet 5    desonide (DESOWEN) 0.05 % cream Apply topically 2 (two) times daily. 60 g 0   EUCRISA 2 % OINT APPLY TO THE AFFECTED AREAS TWICE DAILY AS NEEDED UNTIL SMOOTH CLEAR     fluticasone (FLONASE) 50 MCG/ACT nasal spray Place 2 sprays into both nostrils daily. 48 g 1   fluticasone furoate-vilanterol (BREO ELLIPTA) 100-25 MCG/INH AEPB Inhale 1 puff by mouth once daily 60 each 5   ibuprofen (ADVIL) 800 MG tablet Take 1 tablet (800 mg total) by mouth every 8 (eight) hours as needed. 60 tablet 1   montelukast (SINGULAIR) 10 MG tablet Take 1 tablet by mouth once daily 90 tablet 0   nystatin cream (MYCOSTATIN) Apply topically 2 (two) times daily. 30 g 0   pantoprazole (PROTONIX) 40 MG tablet Take 40 mg by mouth 2 (two) times daily.     Spacer/Aero-Holding Chambers DEVI 1 each by Does not apply route daily. 1 each 0   sucralfate (CARAFATE) 1 GM/10ML suspension Take 1 g by mouth 4 (four) times daily.     SUMAtriptan (IMITREX) 100 MG tablet Take 100 mg by mouth 2 (two) times daily as needed.     Tiotropium Bromide Monohydrate (SPIRIVA RESPIMAT) 1.25 MCG/ACT AERS Inhale 2 puffs into  the lungs daily. 4 g 0   Tiotropium Bromide Monohydrate (SPIRIVA RESPIMAT) 1.25 MCG/ACT AERS Inhale 2 puffs into the lungs daily. 4 g 0   triamcinolone cream (KENALOG) 0.1 % Apply topically 2 (two) times daily. 453.6 g 0   No current facility-administered medications on file prior to visit.    Allergies  Allergen Reactions   Symbicort [Budesonide-Formoterol Fumarate] Itching   Mosquito (Culex Pipiens) Allergy Skin Test Swelling   Mosquito (Diagnostic) Swelling    Social History:  reports that she has never smoked. She has never used smokeless tobacco. She reports that she does not drink alcohol and does not use drugs.  Family History  Problem Relation Age of Onset   COPD Mother    Emphysema Mother    Cancer Father        unknown   Cancer Paternal Aunt        ovarian   Breast cancer Neg Hx     The following  portions of the patient's history were reviewed and updated as appropriate: allergies, current medications, past family history, past medical history, past social history, past surgical history and problem list.  Review of Systems Pertinent items noted in HPI and remainder of comprehensive ROS otherwise negative.  Physical Exam:  BP 124/82   Pulse 79   Ht 5\' 3"  (1.6 m)   Wt 161 lb (73 kg)   BMI 28.52 kg/m  CONSTITUTIONAL: Well-developed, well-nourished female in no acute distress.  HENT:  Normocephalic, atraumatic, External right and left ear normal.  EYES: Conjunctivae and EOM are normal. Pupils are equal, round, and reactive to light. No scleral icterus.  NECK: Normal range of motion, supple, no masses.  Normal thyroid.  SKIN: Skin is warm and dry. No rash noted. Not diaphoretic. No erythema. No pallor. MUSCULOSKELETAL: Normal range of motion. No tenderness.  No cyanosis, clubbing, or edema. NEUROLOGIC: Alert and oriented to person, place, and time. Normal reflexes, muscle tone coordination.  PSYCHIATRIC: Normal mood and affect. Normal behavior. Normal judgment and thought content. CARDIOVASCULAR: Normal heart rate noted, regular rhythm RESPIRATORY: Clear to auscultation bilaterally. Effort and breath sounds normal, no problems with respiration noted. BREASTS: Symmetric in size. No masses, tenderness, skin changes, nipple drainage, or lymphadenopathy bilaterally. Performed in the presence of a chaperone. ABDOMEN: Soft, no distention noted.  No tenderness, rebound or guarding.  PELVIC: Normal appearing external genitalia and urethral meatus; normal appearing vaginal mucosa and cervix.  No abnormal vaginal discharge noted.  Pap smear obtained.  Normal uterine size, no other palpable masses, no uterine or adnexal tenderness.  Performed in the presence of a chaperone.   Assessment and Plan:     1. Breast cancer screening by mammogram Mammogram scheduled - MM 3D SCREEN BREAST BILATERAL;  Future  2. Encounter for surveillance of nuvaring Nuvaring refilled - etonogestrel-ethinyl estradiol (NUVARING) 0.12-0.015 MG/24HR vaginal ring; INSERT ONE RING VAGINALLY EVERY 28 DAYS. USE IN CONTINUOUS FASHION FOR ENDOMETRIOSIS.  Dispense: 3 each; Refill: 12  3. Routine screening for STI (sexually transmitted infection) STI screen done, will follow up results and manage accordingly. - Cytology - PAP ancillary testing for GC/Chlam/Trich - RPR+HBsAg+HCVAb+HIV  4. 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 Reassured by normal pap in 2022.  6. Well woman exam with routine gynecological exam - Cytology - PAP - CBC - Comprehensive metabolic panel -  Lipid panel - Hemoglobin A1c - TSH Will follow up results of pap smear and labs and manage accordingly. Colon cancer screening is up to date. Routine preventative health maintenance measures emphasized. Please refer to After Visit Summary for other counseling recommendations.      Jaynie Collins, MD, FACOG Obstetrician & Gynecologist, [facility] for [employer], [facility]

## 2022-02-26 LAB — LIPID PANEL
Chol/HDL Ratio: 2.7 ratio (ref 0.0–4.4)
Cholesterol, Total: 162 mg/dL (ref 100–199)
HDL: 60 mg/dL (ref >39)
LDL Chol Calc (NIH): 84 mg/dL (ref 0–99)
Triglycerides: 101 mg/dL (ref 0–149)
VLDL Cholesterol Cal: 18 mg/dL (ref 5–40)

## 2022-02-26 LAB — COMPREHENSIVE METABOLIC PANEL
ALT: 11 IU/L (ref 0–32)
AST: 13 IU/L (ref 0–40)
Albumin/Globulin Ratio: 1.5 (ref 1.2–2.2)
Albumin: 4 g/dL (ref 3.8–4.9)
Alkaline Phosphatase: 81 IU/L (ref 44–121)
BUN/Creatinine Ratio: 15 (ref 9–23)
BUN: 10 mg/dL (ref 6–24)
Bilirubin Total: 0.3 mg/dL (ref 0.0–1.2)
CO2: 20 mmol/L (ref 20–29)
Calcium: 8.8 mg/dL (ref 8.7–10.2)
Chloride: 107 mmol/L — ABNORMAL HIGH (ref 96–106)
Creatinine, Ser: 0.67 mg/dL (ref 0.57–1.00)
Globulin, Total: 2.7 g/dL (ref 1.5–4.5)
Glucose: 89 mg/dL (ref 70–99)
Potassium: 4.3 mmol/L (ref 3.5–5.2)
Sodium: 141 mmol/L (ref 134–144)
Total Protein: 6.7 g/dL (ref 6.0–8.5)
eGFR: 106 mL/min/{1.73_m2} (ref >59)

## 2022-02-26 LAB — CBC
Hematocrit: 42.5 % (ref 34.0–46.6)
Hemoglobin: 14.2 g/dL (ref 11.1–15.9)
MCH: 29.3 pg (ref 26.6–33.0)
MCHC: 33.4 g/dL (ref 31.5–35.7)
MCV: 88 fL (ref 79–97)
Platelets: 337 10*3/uL (ref 150–450)
RBC: 4.85 x10E6/uL (ref 3.77–5.28)
RDW: 13.2 % (ref 11.7–15.4)
WBC: 8.3 10*3/uL (ref 3.4–10.8)

## 2022-02-26 LAB — RPR+HBSAG+HCVAB+...
HIV Screen 4th Generation wRfx: NONREACTIVE
Hep C Virus Ab: NONREACTIVE
Hepatitis B Surface Ag: NEGATIVE
RPR Ser Ql: NONREACTIVE

## 2022-02-26 LAB — HEMOGLOBIN A1C
Est. average glucose Bld gHb Est-mCnc: 111 mg/dL
Hgb A1c MFr Bld: 5.5 % (ref 4.8–5.6)

## 2022-02-26 LAB — TSH: TSH: 2.59 u[IU]/mL (ref 0.450–4.500)

## 2022-02-28 LAB — CYTOLOGY - PAP
Chlamydia: NEGATIVE
Comment: NEGATIVE
Comment: NEGATIVE
Comment: NEGATIVE
Comment: NORMAL
Diagnosis: NEGATIVE
High risk HPV: NEGATIVE
Neisseria Gonorrhea: NEGATIVE
Trichomonas: NEGATIVE

## 2022-04-23 ENCOUNTER — Ambulatory Visit
Admission: RE | Admit: 2022-04-23 | Discharge: 2022-04-23 | Disposition: A | Discharge status: Home / Self Care | Payer: Managed Care, Other (non HMO) | Source: Ambulatory Visit | Attending: Obstetrics & Gynecology | Admitting: Obstetrics & Gynecology

## 2022-04-23 DIAGNOSIS — Z1231 Encounter for screening mammogram for malignant neoplasm of breast: Secondary | ICD-10-CM | POA: Insufficient documentation

## 2022-05-29 ENCOUNTER — Encounter: Payer: Self-pay | Admitting: Obstetrics & Gynecology

## 2022-06-02 ENCOUNTER — Ambulatory Visit (INDEPENDENT_AMBULATORY_CARE_PROVIDER_SITE_OTHER): Payer: Managed Care, Other (non HMO) | Admitting: Family Medicine

## 2022-06-02 ENCOUNTER — Encounter: Payer: Self-pay | Admitting: Family Medicine

## 2022-06-02 ENCOUNTER — Other Ambulatory Visit (HOSPITAL_COMMUNITY)
Admission: RE | Admit: 2022-06-02 | Discharge: 2022-06-02 | Disposition: A | Discharge status: Home / Self Care | Payer: Managed Care, Other (non HMO) | Source: Ambulatory Visit | Attending: Obstetrics & Gynecology | Admitting: Obstetrics & Gynecology

## 2022-06-02 VITALS — BP 128/76 | HR 77 | Wt 155.0 lb

## 2022-06-02 DIAGNOSIS — R102 Pelvic and perineal pain: Secondary | ICD-10-CM

## 2022-06-02 DIAGNOSIS — M545 Low back pain, unspecified: Secondary | ICD-10-CM | POA: Diagnosis not present

## 2022-06-02 MED ORDER — CYCLOBENZAPRINE HCL 10 MG PO TABS: 10.0000 mg | ORAL_TABLET | Freq: Three times a day (TID) | ORAL | 5 refills | Status: DC | PRN

## 2022-06-02 MED ORDER — GABAPENTIN 100 MG PO CAPS: 100.0000 mg | ORAL_CAPSULE | Freq: Every day | ORAL | 0 refills | Status: DC

## 2022-06-02 NOTE — Progress Notes (Signed)
RGYN patient here with complaints of pain and cramping. Pain today in 3/10 on/off. Pain has been on both sides today is mainly left side.   Pt had relief when taking muscle relaxer on Last Thursday.   Per notes pt uses Nuvaring for endometriosis   Last seen 02/25/22.

## 2022-06-02 NOTE — Progress Notes (Signed)
   GYNECOLOGY PROBLEM  VISIT ENCOUNTER NOTE  Subjective:   [name] [name] is a 51 y.o. G1P1 female here for a problem GYN visit.  Current complaints: pelvic pain  Reports acute pelvic pain starting on Thursday of last week. Reports it is overall slightly better but still painful. She has pain in her bilateral groin but L>R and initially had pain all over but it has slightly localized. She reports no changes in bowel habits. Taking ibuprofen and tylenol with some alleviation. Also took flexeril which helped but made her sleepy  Uses Nuvaring continuously for endometriosis pain control. No cycle for several years.   Denies abnormal vaginal bleeding, discharge, pelvic pain, problems with intercourse or other gynecologic concerns.    Gynecologic History No LMP recorded. (Menstrual status: Other).  Contraception: NuvaRing vaginal inserts  Health Maintenance Due  Topic Date Due   COVID-19 Vaccine (6 - Pfizer series) 10/02/2020   Zoster Vaccines- Shingrix (1 of 2) Never done   INFLUENZA VACCINE  03/11/2022    The following portions of the patient's history were reviewed and updated as appropriate: allergies, current medications, past family history, past medical history, past social history, past surgical history and problem list.  Review of Systems Pertinent items are noted in HPI.   Objective:  BP 128/76   Pulse 77   Wt 155 lb (70.3 kg)   BMI 27.46 kg/m  Gen: well appearing, NAD HEENT: no scleral icterus CV: RR Lung: Normal WOB Ext: warm well perfused  PELVIC: Normal appearing external genitalia; normal appearing vaginal mucosa and cervix.  No abnormal discharge noted.  Mobile uterus with normal size, no other palpable masses, no uterine tenderness. Left adnexal fullness and tenderness. No discrete mass felt.    Assessment and Plan:  1. Pelvic pain Dxx broad. Could be endometriosis flare but unlikely. Could be ovarian cyst given fullness on exam. Less likely infection tho  this would explain some sx.  Also patient is at typical age for menopause and will check [place] because Nuvaring might be provoking sx if patient had gone through menopause  - Plan -- continue NSAIDs for antinflammatory. PRN flexeril and gabapentin for pain.   - If patient has worsening or stably bad pain recommend RN visit for Toradol.   - Cervicovaginal ancillary only( [place]) - Follicle stimulating hormone - US PELVIC COMPLETE WITH TRANSVAGINAL; Future - gabapentin (NEURONTIN) 100 MG capsule; Take 1 capsule (100 mg total) by mouth at bedtime.  Dispense: 15 capsule; Refill: 0 - cyclobenzaprine (FLEXERIL) 10 MG tablet; Take 1 tablet (10 mg total) by mouth every 8 (eight) hours as needed for muscle spasms.  Dispense: 30 tablet; Refill: 5  2. Pelvic pain in female - gabapentin (NEURONTIN) 100 MG capsule; Take 1 capsule (100 mg total) by mouth at bedtime.  Dispense: 15 capsule; Refill: 0 - cyclobenzaprine (FLEXERIL) 10 MG tablet; Take 1 tablet (10 mg total) by mouth every 8 (eight) hours as needed for muscle spasms.  Dispense: 30 tablet; Refill: 5  3. Acute left-sided low back pain without sciatica - cyclobenzaprine (FLEXERIL) 10 MG tablet; Take 1 tablet (10 mg total) by mouth every 8 (eight) hours as needed for muscle spasms.  Dispense: 30 tablet; Refill: 5  Please refer to After Visit Summary for other counseling recommendations.   Return in about 2 weeks (around 06/16/2022).  Caren Macadam, MD, MPH, ABFM Attending [place] for [facility]

## 2022-06-04 ENCOUNTER — Telehealth: Payer: Self-pay

## 2022-06-04 LAB — CERVICOVAGINAL ANCILLARY ONLY
Bacterial Vaginitis (gardnerella): NEGATIVE
Candida Glabrata: NEGATIVE
Candida Vaginitis: NEGATIVE
Chlamydia: NEGATIVE
Comment: NEGATIVE
Comment: NEGATIVE
Comment: NEGATIVE
Comment: NEGATIVE
Comment: NEGATIVE
Comment: NORMAL
Neisseria Gonorrhea: NEGATIVE
Trichomonas: NEGATIVE

## 2022-06-04 LAB — FOLLICLE STIMULATING HORMONE: FSH: 0.4 m[IU]/mL

## 2022-06-04 NOTE — Telephone Encounter (Signed)
Hey can you call this pt she wants to discuss her labs and what the course of action is

## 2022-06-05 ENCOUNTER — Encounter: Payer: Self-pay | Admitting: Obstetrics & Gynecology

## 2022-06-05 ENCOUNTER — Ambulatory Visit: Payer: Managed Care, Other (non HMO) | Admitting: Obstetrics and Gynecology

## 2022-06-05 ENCOUNTER — Ambulatory Visit
Admission: RE | Admit: 2022-06-05 | Discharge: 2022-06-05 | Disposition: A | Discharge status: Home / Self Care | Payer: Managed Care, Other (non HMO) | Source: Ambulatory Visit | Attending: Family Medicine | Admitting: Family Medicine

## 2022-06-05 DIAGNOSIS — R102 Pelvic and perineal pain: Secondary | ICD-10-CM | POA: Insufficient documentation

## 2022-06-06 ENCOUNTER — Ambulatory Visit: Payer: Managed Care, Other (non HMO)

## 2022-06-06 ENCOUNTER — Encounter: Payer: Self-pay | Admitting: Family Medicine

## 2022-06-09 ENCOUNTER — Ambulatory Visit: Payer: Managed Care, Other (non HMO)

## 2022-06-10 ENCOUNTER — Ambulatory Visit: Payer: Managed Care, Other (non HMO) | Admitting: Obstetrics & Gynecology

## 2022-06-11 ENCOUNTER — Ambulatory Visit (INDEPENDENT_AMBULATORY_CARE_PROVIDER_SITE_OTHER): Payer: Managed Care, Other (non HMO)

## 2022-06-11 VITALS — BP 140/88 | HR 73 | Wt 156.0 lb

## 2022-06-11 DIAGNOSIS — R102 Pelvic and perineal pain: Secondary | ICD-10-CM

## 2022-06-11 MED ORDER — KETOROLAC TROMETHAMINE 60 MG/2ML IM SOLN
60.0000 mg | Freq: Once | INTRAMUSCULAR | Status: AC
  Administered 2022-06-11: 30 mg via INTRAMUSCULAR

## 2022-06-11 MED ORDER — KETOROLAC TROMETHAMINE 30 MG/ML IJ SOLN: 30.0000 mg | Freq: Once | INTRAMUSCULAR | Status: DC

## 2022-06-11 NOTE — Progress Notes (Signed)
Patient here for Toradol injection pain is 4/10x level.  Injection given in [facility]OQ with no difficulties patient tolerated injection well.   Office supplied Exp:09/2023

## 2022-06-11 NOTE — Addendum Note (Signed)
Addended by: Courtney Heys on: 06/11/2022 08:36 AM   Modules accepted: Orders

## 2022-06-12 ENCOUNTER — Other Ambulatory Visit
Admission: RE | Admit: 2022-06-12 | Discharge: 2022-06-12 | Disposition: A | Discharge status: Home / Self Care | Payer: Managed Care, Other (non HMO) | Source: Ambulatory Visit | Attending: Family Medicine | Admitting: Family Medicine

## 2022-06-12 DIAGNOSIS — R0789 Other chest pain: Secondary | ICD-10-CM | POA: Insufficient documentation

## 2022-06-12 LAB — TROPONIN I (HIGH SENSITIVITY): Troponin I (High Sensitivity): 2 ng/L (ref <18)

## 2022-06-13 ENCOUNTER — Emergency Department: Payer: Managed Care, Other (non HMO)

## 2022-06-13 ENCOUNTER — Other Ambulatory Visit: Payer: Self-pay

## 2022-06-13 ENCOUNTER — Emergency Department
Admission: EM | Admit: 2022-06-13 | Discharge: 2022-06-13 | Disposition: A | Discharge status: Home / Self Care | Payer: Managed Care, Other (non HMO) | Attending: Emergency Medicine | Admitting: Emergency Medicine

## 2022-06-13 ENCOUNTER — Encounter: Payer: Self-pay | Admitting: Emergency Medicine

## 2022-06-13 DIAGNOSIS — I10 Essential (primary) hypertension: Secondary | ICD-10-CM

## 2022-06-13 DIAGNOSIS — R0789 Other chest pain: Secondary | ICD-10-CM

## 2022-06-13 LAB — CBC
HCT: 43.4 % (ref 36.0–46.0)
Hemoglobin: 14.3 g/dL (ref 12.0–15.0)
MCH: 28.1 pg (ref 26.0–34.0)
MCHC: 32.9 g/dL (ref 30.0–36.0)
MCV: 85.4 fL (ref 80.0–100.0)
Platelets: 369 10*3/uL (ref 150–400)
RBC: 5.08 MIL/uL (ref 3.87–5.11)
RDW: 14.1 % (ref 11.5–15.5)
WBC: 8.9 10*3/uL (ref 4.0–10.5)
nRBC: 0 % (ref 0.0–0.2)

## 2022-06-13 LAB — BASIC METABOLIC PANEL
Anion gap: 6 (ref 5–15)
BUN: 10 mg/dL (ref 6–20)
CO2: 22 mmol/L (ref 22–32)
Calcium: 8.9 mg/dL (ref 8.9–10.3)
Chloride: 109 mmol/L (ref 98–111)
Creatinine, Ser: 0.55 mg/dL (ref 0.44–1.00)
GFR, Estimated: >60 mL/min (ref >60)
Glucose, Bld: 87 mg/dL (ref 70–99)
Potassium: 3.8 mmol/L (ref 3.5–5.1)
Sodium: 137 mmol/L (ref 135–145)

## 2022-06-13 LAB — TROPONIN I (HIGH SENSITIVITY)
Troponin I (High Sensitivity): 4 ng/L (ref <18)
Troponin I (High Sensitivity): 3 ng/L (ref <18)

## 2022-06-13 MED ORDER — HYDROCHLOROTHIAZIDE 12.5 MG PO TABS: 12.5000 mg | ORAL_TABLET | Freq: Every day | ORAL | 0 refills | Status: DC

## 2022-06-13 NOTE — ED Provider Notes (Addendum)
[facility] Provider Note    Event Date/Time   First MD Initiated Contact with Patient 06/13/22 1335     (approximate)   History   Hypertension   HPI  [name] [name] is a 51 y.o. female   Past medical history of recent presents to the emergency department for hypertension.  She was diagnosed with hypertension at her primary doctor yesterday and has had some chest pressure for the last 2 days.  Constant nonradiating and nonexertional.  No cough, shortness of breath, fever.  She denies leg swelling or pain, no immobilization or recent surgeries, and no history of blood clots.  Mild headache.  He had a plan to recheck blood pressure and if continued to be high we will start on hydrochlorothiazide.  She also ordered outpatient stress testing.  History was obtained via the patient. Collateral information was obtained by independent historian which is a cousin at bedside. I also reviewed her primary care note from earlier this week which the patient shared with me on her MyChart which stated a plan for starting hydrochlorothiazide for high blood pressure.     Physical Exam   Triage Vital Signs: ED Triage Vitals  Enc Vitals Group     BP 06/13/22 1214 (!) 140/105     Pulse Rate 06/13/22 1214 73     Resp 06/13/22 1214 18     Temp 06/13/22 1214 98.7 F (37.1 C)     Temp Source 06/13/22 1214 Oral     SpO2 06/13/22 1214 100 %     Weight 06/13/22 1215 159 lb (72.1 kg)     Height 06/13/22 1215 5\' 3"  (1.6 m)     Head Circumference --      Peak Flow --      Pain Score 06/13/22 1215 4     Pain Loc --      Pain Edu? --      Excl. in GC? --     Most recent vital signs: Vitals:   06/13/22 1214  BP: (!) 140/105  Pulse: 73  Resp: 18  Temp: 98.7 F (37.1 C)  SpO2: 100%    General: Awake, no distress.  CV:  Good peripheral perfusion.  No murmur Resp:  Normal effort. Clear no wheeze or focality Abd:  No distention. Non tender Other:  Overall  well-appearing with hypertension 140/100 and afebrile, hemodynamics otherwise appropriate and reassuring, lung exam is clear, abdomen soft and nontender nonfocal neuro exam with normal finger-to-nose, extraocular movements, neck supple with full range of motion motor or sensory exam is normal.   ED Results / Procedures / Treatments   Labs (all labs ordered are listed, but only abnormal results are displayed) Labs Reviewed  BASIC METABOLIC PANEL  CBC  POC URINE PREG, ED  TROPONIN I (HIGH SENSITIVITY)  TROPONIN I (HIGH SENSITIVITY)     I reviewed labs and they are notable for troponin is 4.  Creatinine is normal.  EKG  ED ECG REPORT I, 13/03/23, the attending physician, personally viewed and interpreted this ECG.   Date: 06/13/2022  EKG Time: 1223  Rate: 71  Rhythm: normal EKG, normal sinus rhythm  Axis: nl  Intervals:none  ST&T Change: no ischemia    RADIOLOGY I independently reviewed and interpreted chest  x-ray and see no focal opacity or pneumothorax   PROCEDURES:  Critical Care performed: No  Procedures   MEDICATIONS ORDERED IN ED: Medications - No data to display  IMPRESSION / MDM / ASSESSMENT  AND PLAN / ED COURSE  I reviewed the triage vital signs and the nursing notes.                              Differential diagnosis includes, but is not limited to, hypertension, ACS, dissection, PE, intracranial bleed or CVA.    MDM: Blood pressure is mildly elevated and I will start her hydrochlorothiazide as planned by her primary doctor.  She has some atypical chest pain and mild headache with a normal exam, normal-appearing EKG which is nonischemic and initial troponin which is negative.  I doubt CVA or intracranial bleeding given mild headache and no neurological exam.  Will obtain a second troponin flat, start blood pressure medication and have her follow-up with PMD for recheck blood pressure and stress testing as planned.  Patient's presentation is most  consistent with acute presentation with potential threat to life or bodily function.       FINAL CLINICAL IMPRESSION(S) / ED DIAGNOSES   Final diagnoses:  Uncontrolled hypertension  Chest pain, atypical     Rx / DC Orders   ED Discharge Orders          Ordered    hydrochlorothiazide (HYDRODIURIL) 12.5 MG tablet  Daily        06/13/22 1458             Note:  This document was prepared using Dragon voice recognition software and may include unintentional dictation errors.    Lucillie Garfinkel, MD 06/13/22 1415    Lucillie Garfinkel, MD 06/13/22 [phone]

## 2022-06-13 NOTE — ED Triage Notes (Signed)
Patient to ED via POV for hypertension. Patient saw PCP yesterday for same and was told to come if BP got higher- 150/101 today. Not currently on BP meds. Patient also c/o headache and chest discomfort- pressure feeling that started yesterday.

## 2022-06-13 NOTE — Discharge Instructions (Signed)
Take hydrochlorothiazide for blood pressure as prescribed and follow-up with your primary doctor next week to recheck blood pressure and adjust medications accordingly.  Thank you for choosing [place] for your health care today!  Please see your primary doctor this week for a follow up appointment.   If you do not have a primary doctor call the following clinics to establish care:  If you have insurance:  [facility] [phone] [place] [address]   [facility]  [phone] [address] [place] [address]   If you do not have insurance:  [facility]  [phone] [address], [place] [address]  Sometimes, in the early stages of certain disease courses it is difficult to detect in the emergency department evaluation -- so, it is important that you continue to monitor your symptoms and call your doctor right away or return to the emergency department if you develop any new or worsening symptoms.  It was my pleasure to care for you today.   Hoover Brunette Jacelyn Grip, MD

## 2022-06-16 ENCOUNTER — Encounter: Payer: Self-pay | Admitting: Family Medicine

## 2022-06-16 ENCOUNTER — Ambulatory Visit (INDEPENDENT_AMBULATORY_CARE_PROVIDER_SITE_OTHER): Payer: Managed Care, Other (non HMO) | Admitting: Family Medicine

## 2022-06-16 VITALS — BP 119/78 | HR 91 | Wt 153.0 lb

## 2022-06-16 DIAGNOSIS — N809 Endometriosis, unspecified: Secondary | ICD-10-CM

## 2022-06-16 NOTE — Progress Notes (Signed)
   GYNECOLOGY PROBLEM  VISIT ENCOUNTER NOTE  Subjective:   [name] [name] is a 51 y.o. G1P1 female here for a problem GYN visit.  Current complaints: Doing well from an endometriosis standpoint. Toradol worked well.   Denies abnormal vaginal bleeding, discharge, pelvic pain, problems with intercourse or other gynecologic concerns.    Gynecologic History No LMP recorded. (Menstrual status: Other).  Contraception: NuvaRing vaginal inserts  Health Maintenance Due  Topic Date Due   COVID-19 Vaccine (6 - Pfizer series) 10/02/2020   Zoster Vaccines- Shingrix (1 of 2) Never done   INFLUENZA VACCINE  03/11/2022    The following portions of the patient's history were reviewed and updated as appropriate: allergies, current medications, past family history, past medical history, past social history, past surgical history and problem list.  Review of Systems Pertinent items are noted in HPI.   Objective:  BP 119/78   Pulse 91   Wt 153 lb (69.4 kg)   BMI 27.10 kg/m  Gen: well appearing, NAD HEENT: no scleral icterus CV: RR Lung: Normal WOB Ext: warm well perfused   Assessment and Plan:  1. Endometriosis Not having pain currently Toradol worked well Discussed continued nuvaring until menopause  2. HTN - BP is well controlled on new medication (HCTZ) - Follow up with PCP    Please refer to After Visit Summary for other counseling recommendations.   Return in about 1 year (around 06/17/2023).  Caren Macadam, MD, MPH, ABFM Attending [place] for [facility]

## 2022-06-16 NOTE — Progress Notes (Signed)
Pt here to F/U on labs and last visit concerns.  Pt notes she had relief from toradol Injection.   Pt recently dx w/ HTN and now on medications

## 2022-06-17 NOTE — Progress Notes (Signed)
Attestation of Attending Supervision of clinical support staff: I agree with the care provided to this patient and was available for any consultation.  I have reviewed the CMA's note and chart, and I agree with the management and plan.  Hyden Soley MD MPH, ABFM Attending Physician Faculty Practice- [facility]  

## 2022-08-25 ENCOUNTER — Encounter: Payer: Self-pay | Admitting: Obstetrics & Gynecology

## 2022-08-27 ENCOUNTER — Other Ambulatory Visit: Payer: Managed Care, Other (non HMO)

## 2022-08-27 DIAGNOSIS — R300 Dysuria: Secondary | ICD-10-CM

## 2022-08-27 NOTE — Progress Notes (Signed)
Attestation of Attending Supervision of clinical support staff: I agree with the care provided to this patient and was available for any consultation.  I have reviewed the CMA's note and chart, and I agree with the management and plan.  Tanay Misuraca MD MPH, ABFM Attending Physician Faculty Practice- [facility]  

## 2022-08-27 NOTE — Progress Notes (Signed)
Pt here to leave Urine for Cx per Dr.[place] Pt advised labs will result to Mychart and provider will reach out after labs are reviewed.

## 2022-08-29 LAB — URINE CULTURE: Organism ID, Bacteria: NO GROWTH

## 2022-11-10 ENCOUNTER — Other Ambulatory Visit: Payer: Self-pay | Admitting: Family Medicine

## 2022-11-10 ENCOUNTER — Ambulatory Visit
Admission: RE | Admit: 2022-11-10 | Discharge: 2022-11-10 | Disposition: A | Discharge status: Home / Self Care | Payer: Managed Care, Other (non HMO) | Source: Ambulatory Visit | Attending: Family Medicine | Admitting: Family Medicine

## 2022-11-10 DIAGNOSIS — M79601 Pain in right arm: Secondary | ICD-10-CM | POA: Diagnosis not present

## 2022-11-13 ENCOUNTER — Encounter: Payer: Self-pay | Admitting: Sports Medicine

## 2022-11-13 ENCOUNTER — Other Ambulatory Visit: Payer: Self-pay | Admitting: Sports Medicine

## 2022-11-13 DIAGNOSIS — M7501 Adhesive capsulitis of right shoulder: Secondary | ICD-10-CM

## 2022-11-13 DIAGNOSIS — G8929 Other chronic pain: Secondary | ICD-10-CM

## 2022-11-17 ENCOUNTER — Other Ambulatory Visit: Payer: Managed Care, Other (non HMO)

## 2022-12-12 ENCOUNTER — Other Ambulatory Visit: Payer: Self-pay | Admitting: Sports Medicine

## 2022-12-12 DIAGNOSIS — M7501 Adhesive capsulitis of right shoulder: Secondary | ICD-10-CM

## 2022-12-12 DIAGNOSIS — G8929 Other chronic pain: Secondary | ICD-10-CM

## 2022-12-15 ENCOUNTER — Ambulatory Visit
Admission: RE | Admit: 2022-12-15 | Discharge: 2022-12-15 | Disposition: A | Discharge status: Home / Self Care | Payer: Managed Care, Other (non HMO) | Source: Ambulatory Visit | Attending: Sports Medicine | Admitting: Sports Medicine

## 2022-12-15 DIAGNOSIS — M7501 Adhesive capsulitis of right shoulder: Secondary | ICD-10-CM

## 2022-12-15 DIAGNOSIS — G8929 Other chronic pain: Secondary | ICD-10-CM

## 2023-02-02 ENCOUNTER — Encounter: Payer: Self-pay | Admitting: Obstetrics & Gynecology

## 2023-02-02 ENCOUNTER — Other Ambulatory Visit: Payer: Self-pay | Admitting: Obstetrics & Gynecology

## 2023-02-02 ENCOUNTER — Other Ambulatory Visit (HOSPITAL_COMMUNITY)
Admission: RE | Admit: 2023-02-02 | Discharge: 2023-02-02 | Disposition: A | Discharge status: Home / Self Care | Payer: Managed Care, Other (non HMO) | Source: Ambulatory Visit | Attending: Obstetrics & Gynecology | Admitting: Obstetrics & Gynecology

## 2023-02-02 ENCOUNTER — Ambulatory Visit: Payer: Managed Care, Other (non HMO) | Admitting: Obstetrics & Gynecology

## 2023-02-02 VITALS — BP 130/79 | HR 66 | Ht 63.0 in | Wt 151.0 lb

## 2023-02-02 DIAGNOSIS — Z01419 Encounter for gynecological examination (general) (routine) without abnormal findings: Secondary | ICD-10-CM | POA: Insufficient documentation

## 2023-02-02 DIAGNOSIS — Z113 Encounter for screening for infections with a predominantly sexual mode of transmission: Secondary | ICD-10-CM | POA: Diagnosis not present

## 2023-02-02 DIAGNOSIS — Z1339 Encounter for screening examination for other mental health and behavioral disorders: Secondary | ICD-10-CM | POA: Diagnosis not present

## 2023-02-02 DIAGNOSIS — Z1231 Encounter for screening mammogram for malignant neoplasm of breast: Secondary | ICD-10-CM

## 2023-02-02 DIAGNOSIS — Z3049 Encounter for surveillance of other contraceptives: Secondary | ICD-10-CM

## 2023-02-02 MED ORDER — ETONOGESTREL-ETHINYL ESTRADIOL 0.12-0.015 MG/24HR VA RING: VAGINAL_RING | VAGINAL | 12 refills | Status: DC

## 2023-02-02 NOTE — Progress Notes (Signed)
GYNECOLOGY ANNUAL PREVENTATIVE CARE ENCOUNTER NOTE  History:     [name] [name] is a 52 y.o. G1P1 female here for a routine annual gynecologic exam.  Current complaints: none.  Desires annual preventative healthcare maintenance labs and STI screen.  Also desires Nuvaring refill.   Denies abnormal vaginal bleeding, discharge, pelvic pain, problems with intercourse or other gynecologic concerns.    Gynecologic History No LMP recorded. (Menstrual status: Other). Contraception: NuvaRing vaginal inserts Last Pap: 02/25/2022. Result was normal with negative HPV Last Mammogram: 04/24/2022.  Result was normal Last Colonoscopy: 10/01/2020.  Result was benign  Obstetric History OB History  Gravida Para Term Preterm AB Living  1 1       1   SAB IAB Ectopic Multiple Live Births          1    # Outcome Date GA Lbr Len/2nd Weight Sex Delivery Anes PTL Lv  1 Para 1995    M CS-LTranv   LIV    Past Medical History:  Diagnosis Date   Allergy    Arthritis    Asthma    Constipation    Endometriosis    Uses Nuvaring continuously   Headache    Heart murmur    Ulcer     Past Surgical History:  Procedure Laterality Date   ANKLE SURGERY Right 06/11/2017   APPENDECTOMY  2008   CESAREAN SECTION  1995   CHOLECYSTECTOMY  2000   LAPAROSCOPIC ENDOMETRIOSIS FULGURATION     spinal tap     WISDOM TOOTH EXTRACTION     x2    Current Outpatient Medications on File Prior to Visit  Medication Sig Dispense Refill   albuterol (VENTOLIN HFA) 108 (90 Base) MCG/ACT inhaler Inhale 2 puffs into the lungs every 6 (six) hours as needed for wheezing or shortness of breath. 18 g 1   Cetirizine HCl 10 MG CAPS Take 10 mg by mouth daily.     cyclobenzaprine (FLEXERIL) 10 MG tablet Take 1 tablet (10 mg total) by mouth every 8 (eight) hours as needed for muscle spasms. 30 tablet 5   desonide (DESOWEN) 0.05 % cream Apply topically 2 (two) times daily. 60 g 0   fluticasone (FLONASE) 50 MCG/ACT nasal spray Place 2  sprays into both nostrils daily. 48 g 1   fluticasone furoate-vilanterol (BREO ELLIPTA) 100-25 MCG/INH AEPB Inhale 1 puff by mouth once daily 60 each 5   hydrochlorothiazide (HYDRODIURIL) 25 MG tablet Take 25 mg by mouth daily.     montelukast (SINGULAIR) 10 MG tablet Take 1 tablet by mouth once daily 90 tablet 0   pantoprazole (PROTONIX) 40 MG tablet Take 40 mg by mouth 2 (two) times daily.     Spacer/Aero-Holding Chambers DEVI 1 each by Does not apply route daily. 1 each 0   Tiotropium Bromide Monohydrate (SPIRIVA RESPIMAT) 1.25 MCG/ACT AERS Inhale 2 puffs into the lungs daily. 4 g 0   Tiotropium Bromide Monohydrate (SPIRIVA RESPIMAT) 1.25 MCG/ACT AERS Inhale 2 puffs into the lungs daily. 4 g 0   Azelastine HCl 0.15 % SOLN Place 1 spray into the nose daily. 90 mL 1   EUCRISA 2 % OINT APPLY TO THE AFFECTED AREAS TWICE DAILY AS NEEDED UNTIL SMOOTH CLEAR (Patient not taking: Reported on 02/02/2023)     gabapentin (NEURONTIN) 100 MG capsule Take 1 capsule (100 mg total) by mouth at bedtime. (Patient not taking: Reported on 02/02/2023) 15 capsule 0   hydrochlorothiazide (HYDRODIURIL) 12.5 MG tablet Take 1 tablet (12.5  mg total) by mouth daily. (Patient not taking: Reported on 02/02/2023) 60 tablet 0   ibuprofen (ADVIL) 800 MG tablet Take 1 tablet (800 mg total) by mouth every 8 (eight) hours as needed. (Patient not taking: Reported on 02/02/2023) 60 tablet 1   nystatin cream (MYCOSTATIN) Apply topically 2 (two) times daily. (Patient not taking: Reported on 02/02/2023) 30 g 0   sucralfate (CARAFATE) 1 GM/10ML suspension Take 1 g by mouth 4 (four) times daily. (Patient not taking: Reported on 02/02/2023)     SUMAtriptan (IMITREX) 100 MG tablet Take 100 mg by mouth 2 (two) times daily as needed. (Patient not taking: Reported on 02/02/2023)     triamcinolone cream (KENALOG) 0.1 % Apply topically 2 (two) times daily. (Patient not taking: Reported on 02/02/2023) 453.6 g 0   No current facility-administered  medications on file prior to visit.    Allergies  Allergen Reactions   Symbicort [Budesonide-Formoterol Fumarate] Itching   Mosquito (Culex Pipiens) Allergy Skin Test Swelling   Mosquito (Diagnostic) Swelling    Social History:  reports that she has never smoked. She has never used smokeless tobacco. She reports that she does not drink alcohol and does not use drugs.  Family History  Problem Relation Age of Onset   COPD Mother    Emphysema Mother    Cancer Father        unknown   Cancer Paternal Aunt        ovarian   Breast cancer Neg Hx     The following portions of the patient's history were reviewed and updated as appropriate: allergies, current medications, past family history, past medical history, past social history, past surgical history and problem list.  Review of Systems Pertinent items noted in HPI and remainder of comprehensive ROS otherwise negative.  Physical Exam:  BP 130/79   Pulse 66   Ht 5\' 3"  (1.6 m)   Wt 151 lb (68.5 kg)   BMI 26.75 kg/m  CONSTITUTIONAL: Well-developed, well-nourished female in no acute distress.  HENT:  Normocephalic, atraumatic, External right and left ear normal.  EYES: Conjunctivae and EOM are normal. Pupils are equal, round, and reactive to light. No scleral icterus.  NECK: Normal range of motion, supple, no masses.  Normal thyroid.  SKIN: Skin is warm and dry. No rash noted. Not diaphoretic. No erythema. No pallor. MUSCULOSKELETAL: Normal range of motion. No tenderness.  No cyanosis, clubbing, or edema. NEUROLOGIC: Alert and oriented to person, place, and time. Normal reflexes, muscle tone coordination.  PSYCHIATRIC: Normal mood and affect. Normal behavior. Normal judgment and thought content. CARDIOVASCULAR: Normal heart rate noted, regular rhythm RESPIRATORY: Clear to auscultation bilaterally. Effort and breath sounds normal, no problems with respiration noted. BREASTS: Symmetric in size. No masses, tenderness, skin changes,  nipple drainage, or lymphadenopathy bilaterally. Performed in the presence of a chaperone. ABDOMEN: Soft, no distention noted.  No tenderness, rebound or guarding.  PELVIC: Normal appearing external genitalia and urethral meatus; normal appearing vaginal mucosa and cervix.  No abnormal vaginal discharge noted.  Pap smear obtained.  Normal uterine size, no other palpable masses, no uterine or adnexal tenderness.  Performed in the presence of a chaperone.   Assessment and Plan:     1. Encounter for surveillance of nuvaring Nuvaring refilled - etonogestrel-ethinyl estradiol (NUVARING) 0.12-0.015 MG/24HR vaginal ring; INSERT ONE RING VAGINALLY EVERY 28 DAYS. USE IN CONTINUOUS FASHION FOR ENDOMETRIOSIS.  Dispense: 3 each; Refill: 12  2. Breast cancer screening by mammogram Mammogram to be scheduled. - MM  3D SCREENING MAMMOGRAM BILATERAL BREAST; Future  3. Routine screening for STI (sexually transmitted infection) STI screen done, will follow up results and manage accordingly. - Cytology - PAP - RPR+HBsAg+HCVAb+HIV; Future  4. Well woman exam with routine gynecological exam  - Cytology - PAP - CBC; Future - Comprehensive metabolic panel; Future - Lipid panel; Future - Hemoglobin A1c; Future - TSH Rfx on Abnormal to Free T4; Future Will follow up results of pap smear, labs and manage accordingly. Colon cancer screening is up to date. Routine preventative health maintenance measures emphasized. Please refer to After Visit Summary for other counseling recommendations.      Jaynie Collins, MD, FACOG Obstetrician & Gynecologist, [facility] for [employer], [facility]

## 2023-02-03 ENCOUNTER — Other Ambulatory Visit: Payer: Managed Care, Other (non HMO)

## 2023-02-03 DIAGNOSIS — Z113 Encounter for screening for infections with a predominantly sexual mode of transmission: Secondary | ICD-10-CM

## 2023-02-03 DIAGNOSIS — Z01419 Encounter for gynecological examination (general) (routine) without abnormal findings: Secondary | ICD-10-CM

## 2023-02-04 LAB — CYTOLOGY - PAP
Chlamydia: NEGATIVE
Comment: NEGATIVE
Comment: NEGATIVE
Comment: NEGATIVE
Comment: NORMAL
Diagnosis: NEGATIVE
High risk HPV: NEGATIVE
Neisseria Gonorrhea: NEGATIVE
Trichomonas: NEGATIVE

## 2023-02-04 LAB — LIPID PANEL
Chol/HDL Ratio: 2.5 ratio (ref 0.0–4.4)
Cholesterol, Total: 180 mg/dL (ref 100–199)
HDL: 72 mg/dL (ref >39)
LDL Chol Calc (NIH): 89 mg/dL (ref 0–99)
Triglycerides: 109 mg/dL (ref 0–149)
VLDL Cholesterol Cal: 19 mg/dL (ref 5–40)

## 2023-02-04 LAB — RPR+HBSAG+HCVAB+...
HIV Screen 4th Generation wRfx: NONREACTIVE
Hep C Virus Ab: NONREACTIVE
Hepatitis B Surface Ag: NEGATIVE
RPR Ser Ql: NONREACTIVE

## 2023-02-04 LAB — COMPREHENSIVE METABOLIC PANEL
ALT: 14 IU/L (ref 0–32)
AST: 15 IU/L (ref 0–40)
Albumin: 4 g/dL (ref 3.8–4.9)
Alkaline Phosphatase: 78 IU/L (ref 44–121)
BUN/Creatinine Ratio: 16 (ref 9–23)
BUN: 11 mg/dL (ref 6–24)
Bilirubin Total: 0.4 mg/dL (ref 0.0–1.2)
CO2: 24 mmol/L (ref 20–29)
Calcium: 9.4 mg/dL (ref 8.7–10.2)
Chloride: 102 mmol/L (ref 96–106)
Creatinine, Ser: 0.67 mg/dL (ref 0.57–1.00)
Globulin, Total: 2.7 g/dL (ref 1.5–4.5)
Glucose: 93 mg/dL (ref 70–99)
Potassium: 3.8 mmol/L (ref 3.5–5.2)
Sodium: 140 mmol/L (ref 134–144)
Total Protein: 6.7 g/dL (ref 6.0–8.5)
eGFR: 105 mL/min/{1.73_m2} (ref >59)

## 2023-02-04 LAB — CBC
Hematocrit: 43.3 % (ref 34.0–46.6)
Hemoglobin: 14.5 g/dL (ref 11.1–15.9)
MCH: 29.1 pg (ref 26.6–33.0)
MCHC: 33.5 g/dL (ref 31.5–35.7)
MCV: 87 fL (ref 79–97)
Platelets: 392 10*3/uL (ref 150–450)
RBC: 4.99 x10E6/uL (ref 3.77–5.28)
RDW: 13.3 % (ref 11.7–15.4)
WBC: 8.1 10*3/uL (ref 3.4–10.8)

## 2023-02-04 LAB — TSH RFX ON ABNORMAL TO FREE T4: TSH: 1.52 u[IU]/mL (ref 0.450–4.500)

## 2023-02-04 LAB — HEMOGLOBIN A1C
Est. average glucose Bld gHb Est-mCnc: 111 mg/dL
Hgb A1c MFr Bld: 5.5 % (ref 4.8–5.6)

## 2023-02-13 ENCOUNTER — Telehealth: Payer: Self-pay | Admitting: Pulmonary Disease

## 2023-02-13 NOTE — Telephone Encounter (Signed)
Pt is dealing with a cough and congestion. Was last seen in 2022

## 2023-02-13 NOTE — Telephone Encounter (Signed)
Lm for patient.  Patient last seen 03/2021. Appt is needed.

## 2023-02-13 NOTE — Telephone Encounter (Signed)
Lm x2 for patient.   

## 2023-02-16 NOTE — Telephone Encounter (Signed)
lm x3 for patient.  Will close encounter per office protocol.

## 2023-04-08 ENCOUNTER — Other Ambulatory Visit: Payer: Self-pay | Admitting: *Deleted

## 2023-04-08 MED ORDER — NYSTATIN-TRIAMCINOLONE 100000-0.1 UNIT/GM-% EX OINT: 1.0000 | TOPICAL_OINTMENT | Freq: Two times a day (BID) | CUTANEOUS | 0 refills | Status: AC

## 2023-04-15 ENCOUNTER — Other Ambulatory Visit: Payer: Self-pay | Admitting: Obstetrics & Gynecology

## 2023-04-15 ENCOUNTER — Ambulatory Visit: Payer: Managed Care, Other (non HMO) | Admitting: Primary Care

## 2023-04-15 DIAGNOSIS — Z1231 Encounter for screening mammogram for malignant neoplasm of breast: Secondary | ICD-10-CM

## 2023-04-27 ENCOUNTER — Ambulatory Visit: Payer: Managed Care, Other (non HMO)

## 2023-04-28 ENCOUNTER — Ambulatory Visit
Admission: RE | Admit: 2023-04-28 | Discharge: 2023-04-28 | Disposition: A | Discharge status: Home / Self Care | Payer: Managed Care, Other (non HMO) | Source: Ambulatory Visit | Attending: Obstetrics & Gynecology | Admitting: Obstetrics & Gynecology

## 2023-04-28 DIAGNOSIS — Z1231 Encounter for screening mammogram for malignant neoplasm of breast: Secondary | ICD-10-CM | POA: Diagnosis present

## 2023-04-29 ENCOUNTER — Other Ambulatory Visit: Payer: Self-pay | Admitting: Obstetrics & Gynecology

## 2023-04-29 DIAGNOSIS — N6489 Other specified disorders of breast: Secondary | ICD-10-CM

## 2023-04-29 DIAGNOSIS — R928 Other abnormal and inconclusive findings on diagnostic imaging of breast: Secondary | ICD-10-CM

## 2023-05-07 ENCOUNTER — Other Ambulatory Visit: Payer: Managed Care, Other (non HMO)

## 2023-05-08 ENCOUNTER — Ambulatory Visit
Admission: RE | Admit: 2023-05-08 | Discharge: 2023-05-08 | Disposition: A | Discharge status: Home / Self Care | Payer: Managed Care, Other (non HMO) | Source: Ambulatory Visit | Attending: Obstetrics & Gynecology | Admitting: Obstetrics & Gynecology

## 2023-05-08 DIAGNOSIS — N6489 Other specified disorders of breast: Secondary | ICD-10-CM | POA: Diagnosis present

## 2023-05-08 DIAGNOSIS — R928 Other abnormal and inconclusive findings on diagnostic imaging of breast: Secondary | ICD-10-CM | POA: Diagnosis present

## 2023-05-19 ENCOUNTER — Encounter: Payer: Self-pay | Admitting: Pulmonary Disease

## 2023-05-19 ENCOUNTER — Ambulatory Visit: Payer: Managed Care, Other (non HMO) | Admitting: Pulmonary Disease

## 2023-05-19 VITALS — BP 118/80 | HR 68 | Temp 97.7°F | Ht 63.0 in | Wt 149.4 lb

## 2023-05-19 DIAGNOSIS — R058 Other specified cough: Secondary | ICD-10-CM

## 2023-05-19 DIAGNOSIS — J302 Other seasonal allergic rhinitis: Secondary | ICD-10-CM

## 2023-05-19 DIAGNOSIS — R0602 Shortness of breath: Secondary | ICD-10-CM

## 2023-05-19 DIAGNOSIS — J3089 Other allergic rhinitis: Secondary | ICD-10-CM

## 2023-05-19 DIAGNOSIS — R052 Subacute cough: Secondary | ICD-10-CM

## 2023-05-19 DIAGNOSIS — J4520 Mild intermittent asthma, uncomplicated: Secondary | ICD-10-CM | POA: Diagnosis not present

## 2023-05-19 LAB — NITRIC OXIDE: Nitric Oxide: 6

## 2023-05-19 MED ORDER — FLUTICASONE PROPIONATE 50 MCG/ACT NA SUSP
1.0000 | Freq: Two times a day (BID) | NASAL | 1 refills | Status: DC
Start: 2023-05-19 — End: 2023-08-07

## 2023-05-19 MED ORDER — TRELEGY ELLIPTA 100-62.5-25 MCG/ACT IN AEPB: 1.0000 | INHALATION_SPRAY | Freq: Every day | RESPIRATORY_TRACT | 11 refills | Status: DC

## 2023-05-19 MED ORDER — TRELEGY ELLIPTA 100-62.5-25 MCG/ACT IN AEPB: 1.0000 | INHALATION_SPRAY | Freq: Every day | RESPIRATORY_TRACT | 0 refills | Status: DC

## 2023-05-19 NOTE — Progress Notes (Signed)
Subjective:    Patient ID: [name] [name], female    DOB: 10/03/1970, 52 y.o.   MRN: [id number]  Patient Care Team: Marisue Ivan, MD as PCP - General (Family Medicine) Coral Else, MD as Referring Physician (Dermatology) Salena Saner, MD as Consulting Physician (Pulmonary Disease)  Chief Complaint  Patient presents with   Follow-up    Dry cough off and on since June. SOB and wheezing yesterday and today.     HPI Patient is a 52 year old lifelong never smoker with a history of mild intermittent asthma who presents for reevaluation due to dry cough chest congestion and increasing shortness of breath since June 2024.  I last saw the patient on 20 February 2021, subsequently she saw our nurse practitioner Buelah Manis on 27 March 2021 and has been lost to follow-up since then.  She has been maintained on Breo Ellipta 100/25, 1 inhalation daily as well as as needed albuterol.  She developed the issues with cough which are worse at nighttime when she lays down.  It is a dry cough that she states may come off and on with some days better than others.  She has also noted wheezing and shortness of breath which started yesterday.  Nothing in particular makes it better according to her.  She has noted issues with eczema and perennial allergic rhinitis with seasonal variation.  Her speech today appears to be quite nasal and she has constant sniffling.  She has issues with reflux that well-controlled with PPI. She has not had any fevers, chills or sweats.     She has been on Zyrtec nocturnally, has taken several preparations over-the-counter such as Mucinex DM without much relief.  She continues to use [employer] as noted above, as needed albuterol and Singulair.  She does note that she does get some help from the [facility] however, none of these things have provided lasting effect.  She has not used the albuterol when she gets into coughing spells.  She does not endorse any other issues nor  symptomatology.    DATA 03/19/2021 PFTs: FVC 2.79 (86%), FEV1 2.24 (96%), ratio 81, TLC 78%, DLCO 20.35 (73%)  Review of Systems A 10 point review of systems was performed and it is as noted above otherwise negative.   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 02/27/2017   Post-traumatic osteoarthritis of right foot 09/08/2016   Endometriosis 01/05/2013    Family History  Problem Relation Age of Onset   COPD Mother    Emphysema Mother    Cancer  Father        unknown   Cancer Paternal Aunt        ovarian   Breast cancer Neg Hx     Social History   Tobacco Use   Smoking status: Never   Smokeless tobacco: Never  Substance Use Topics   Alcohol use: No    Alcohol/week: 0.0 standard drinks of alcohol    Allergies  Allergen Reactions   Mosquito (Culex Pipiens) Allergy Skin Test Swelling   Mosquito (Diagnostic) Swelling   Symbicort [Budesonide-Formoterol Fumarate] Itching    Current Meds  Medication Sig   albuterol (VENTOLIN HFA) 108 (90 Base) MCG/ACT inhaler Inhale 2 puffs into the lungs every 6 (six) hours as needed for wheezing or shortness of breath.   Azelastine HCl  0.15 % SOLN Place 1 spray into the nose daily.   Cetirizine HCl 10 MG CAPS Take 10 mg by mouth daily.   cyclobenzaprine (FLEXERIL) 10 MG tablet Take 1 tablet (10 mg total) by mouth every 8 (eight) hours as needed for muscle spasms.   desonide (DESOWEN) 0.05 % cream Apply topically 2 (two) times daily.   etonogestrel-ethinyl estradiol (ELURYNG) 0.12-0.015 MG/24HR vaginal ring Insert one ring vaginally every 28 days.   [START ON 05/25/2023] Fluticasone-Umeclidin-Vilant (TRELEGY ELLIPTA) 100-62.5-25 MCG/ACT AEPB Inhale 1 puff into the lungs daily.   Fluticasone-Umeclidin-Vilant (TRELEGY ELLIPTA) 100-62.5-25 MCG/ACT AEPB Inhale 1 puff into the lungs daily.   hydrochlorothiazide (HYDRODIURIL) 25 MG tablet Take 25 mg by mouth daily.   ibuprofen (ADVIL) 800 MG tablet Take 1 tablet (800 mg total) by mouth every 8 (eight) hours as needed.   montelukast (SINGULAIR) 10 MG tablet Take 1 tablet by mouth once daily   nystatin cream (MYCOSTATIN) Apply topically 2 (two) times daily.   pantoprazole (PROTONIX) 40 MG tablet Take 40 mg by mouth 2 (two) times daily.   Spacer/Aero-Holding Chambers DEVI 1 each by Does not apply route daily.   SUMAtriptan (IMITREX) 100 MG tablet Take 100 mg by mouth 2 (two) times daily as needed.   triamcinolone cream (KENALOG) 0.1 % Apply topically 2 (two) times daily.   [DISCONTINUED] fluticasone (FLONASE) 50 MCG/ACT nasal spray Place 2 sprays into both nostrils daily.   [DISCONTINUED] fluticasone furoate-vilanterol (BREO ELLIPTA) 100-25 MCG/INH AEPB Inhale 1 puff by mouth once daily    Immunization History  Administered Date(s) Administered   Influenza Inj Mdck Quad Pf 06/18/2022   Influenza,inj,Quad PF,6+ Mos 05/31/2019, 06/05/2020   Influenza-Unspecified 06/03/2018, 05/31/2019, 06/05/2020, 06/14/2021   PFIZER Comirnaty(Gray Top)Covid-19 Tri-Sucrose Vaccine 10/13/2019, 11/03/2019, 08/07/2020   PFIZER(Purple Top)SARS-COV-2 Vaccination 10/13/2019, 11/03/2019, 08/07/2020   Tdap  09/27/2018        Objective:     BP 118/80 (BP Location: Right Arm, Cuff Size: Normal)   Pulse 68   Temp 97.7 F (36.5 C)   Ht 5\' 3"  (1.6 m)   Wt 149 lb 6.4 oz (67.8 kg)   SpO2 97%   BMI 26.47 kg/m   SpO2: 97 % O2 Device: None (Room air)  GENERAL: Well-developed well-nourished woman, no acute distress, fully ambulatory.  No conversational dyspnea.  Does almost continuous sniffling and clearing of the throat during the examination. HEAD: Normocephalic, atraumatic.  No sinus tenderness. EYES: Pupils equal, round, reactive to light.  No scleral icterus.  EARS: Mild serous otitis bilaterally, no injection or exudate. NOSE: Turbinate edema bilaterally, clear nasal discharge, narrow nasal passages. MOUTH: Dentition intact, oral mucosa moist.  Clear postnasal drip noted on examination of the pharynx. NECK: Supple. No  thyromegaly. Trachea midline. No JVD.  No adenopathy. PULMONARY: Good air entry bilaterally.  No adventitious sounds. CARDIOVASCULAR: S1 and S2. Regular rate and rhythm.  No rubs, murmurs or gallops heard. ABDOMEN: Benign. MUSCULOSKELETAL: No joint deformity, no clubbing, no edema.  NEUROLOGIC: No focal deficit, no gait disturbance, speech is fluent. SKIN: Intact,warm,dry. PSYCH: Mood and behavior normal   Lab Results  Component Value Date   NITRICOXIDE 6 05/19/2023   Assessment & Plan:     ICD-10-CM   1. Subacute cough  R05.2 Nitric oxide   Suspect upper airway cough syndrome Significant postnasal drip Element of cough variant asthma    2. Mild intermittent asthma without complication  J45.20    Trial of Trelegy 100 Discontinue Breo Continue as needed albuterol Reassess with PFTs    3. Perennial allergic rhinitis with seasonal variation  J30.89 fluticasone (FLONASE) 50 MCG/ACT nasal spray   J30.2    Resume Flonase 1 puff twice daily to each nostril  Continue Zyrtec and montelukast    4. Upper airway cough syndrome  R05.8    Suspect due to poorly  controlled allergic rhinitis Management as above      Orders Placed This Encounter  Procedures   Nitric oxide    Meds ordered this encounter  Medications   fluticasone (FLONASE) 50 MCG/ACT nasal spray    Sig: Place 1 spray into both nostrils 2 (two) times daily.    Dispense:  32 mL    Refill:  1   Fluticasone-Umeclidin-Vilant (TRELEGY ELLIPTA) 100-62.5-25 MCG/ACT AEPB    Sig: Inhale 1 puff into the lungs daily.    Dispense:  28 each    Refill:  11   Fluticasone-Umeclidin-Vilant (TRELEGY ELLIPTA) 100-62.5-25 MCG/ACT AEPB    Sig: Inhale 1 puff into the lungs daily.    Dispense:  14 each    Refill:  0    Order Specific Question:   Lot Number?    Answer:   [id number]    Order Specific Question:   Expiration Date?    Answer:   08/11/2024    Order Specific Question:   Quantity    Answer:   1    The patient will be seen in the clinic in 4 to 6 weeks time she is to call sooner should any new difficulties arise.  Gailen Shelter, MD Advanced Bronchoscopy PCCM [facility]    *This note was dictated using voice recognition software/Dragon.  Despite best efforts to proofread, errors can occur which can change the meaning. Any transcriptional errors that result from this process are unintentional and may not be fully corrected at the time of dictation.

## 2023-05-19 NOTE — Patient Instructions (Addendum)
I recommend that you take Zyrtec 1 tablet at bedtime.  Continue your Singulair (montelukast).  We sent in a prescription for your  Flonase, please restart it.  Do 1 spray to each nostril twice a day.  We are going to give you a trial of an inhaler called Trelegy, this is 1 puff daily.  THIS REPLACES THE BREO.  Sure you rinse your mouth well after use of Trelegy.  We will see you in follow-up in 4 to 6 weeks time call sooner should any new problems arise.

## 2023-06-18 ENCOUNTER — Ambulatory Visit: Payer: Managed Care, Other (non HMO) | Admitting: Pulmonary Disease

## 2023-07-22 ENCOUNTER — Ambulatory Visit: Payer: Managed Care, Other (non HMO) | Admitting: Pulmonary Disease

## 2023-08-07 ENCOUNTER — Ambulatory Visit: Payer: Managed Care, Other (non HMO) | Admitting: Pulmonary Disease

## 2023-08-07 ENCOUNTER — Encounter: Payer: Self-pay | Admitting: Pulmonary Disease

## 2023-08-07 VITALS — BP 116/86 | HR 71 | Temp 98.0°F | Ht 63.0 in | Wt 147.8 lb

## 2023-08-07 DIAGNOSIS — R053 Chronic cough: Secondary | ICD-10-CM

## 2023-08-07 DIAGNOSIS — K219 Gastro-esophageal reflux disease without esophagitis: Secondary | ICD-10-CM | POA: Diagnosis not present

## 2023-08-07 DIAGNOSIS — J3089 Other allergic rhinitis: Secondary | ICD-10-CM | POA: Diagnosis not present

## 2023-08-07 DIAGNOSIS — J4520 Mild intermittent asthma, uncomplicated: Secondary | ICD-10-CM | POA: Diagnosis not present

## 2023-08-07 DIAGNOSIS — J302 Other seasonal allergic rhinitis: Secondary | ICD-10-CM

## 2023-08-07 LAB — NITRIC OXIDE: Nitric Oxide: 11

## 2023-08-07 MED ORDER — MOMETASONE FUROATE 50 MCG/ACT NA SUSP: 1.0000 | Freq: Two times a day (BID) | NASAL | 6 refills | Status: AC

## 2023-08-07 MED ORDER — QVAR REDIHALER 80 MCG/ACT IN AERB: 2.0000 | INHALATION_SPRAY | Freq: Two times a day (BID) | RESPIRATORY_TRACT | 3 refills | Status: DC

## 2023-08-07 NOTE — Patient Instructions (Signed)
VISIT SUMMARY:  During your follow-up visit, we discussed your persistent cough, nasal congestion, and postnasal drip. We reviewed your current medications and previous treatments, and we have made some adjustments to better manage your symptoms. We also talked about your history of acid reflux, which is currently well-controlled.  YOUR PLAN:  -COUGH VARIANT ASTHMA: Cough variant asthma is a type of asthma where the main symptom is a dry, non-productive cough. We have prescribed a Qvar inhaler, which you should use two puffs twice a day. Please ensure you rinse your mouth after each use to prevent any side effects. We also discussed that powdered inhalers might worsen your cough, so we chose a mist inhaler for you. We will follow up in 6-8 weeks to see how you are responding to the treatment. If your symptoms persist, we may refer you to an ENT specialist for further evaluation and allergy testing.  -ALLERGIC RHINITIS: Allergic rhinitis is an allergic reaction that causes nasal congestion and postnasal drip. We have switched your nasal spray to Nasonex, which you should use one spray in each nostril twice a day. If Zyrtec is not effective, we may consider trying Allegra. We will follow up in 6-8 weeks to assess your response to the new treatment. If your symptoms continue, we may refer you to an ENT specialist for further evaluation and allergy testing.  -GASTROESOPHAGEAL REFLUX DISEASE (GERD): GERD is a condition where stomach acid frequently flows back into the esophagus, causing irritation. Your acid reflux is well-controlled with Pantoprazole, so please continue taking it as prescribed.  INSTRUCTIONS:  Please follow up in 6-8 weeks to assess your response to the new treatments. If your symptoms persist, we may refer you to an ENT specialist for further evaluation and allergy testing.

## 2023-08-07 NOTE — Progress Notes (Signed)
Subjective:    Patient ID: [name] [name], female    DOB: 03/20/1971, 52 y.o.   MRN: [id number]  Patient Care Team: Marisue Ivan, MD as PCP - General (Family Medicine) Coral Else, MD as Referring Physician (Dermatology) Salena Saner, MD as Consulting Physician (Pulmonary Disease)  Chief Complaint  Patient presents with   Follow-up    Cough, shortness of breath and wheezing.     BACKGROUND/INTERVAL:Patient is a 52 year old lifelong never smoker with a history of mild intermittent asthma who presents for reevaluation due to dry cough chest congestion and increasing shortness of breath since June 2024.  She was last seen 19 May 2023 and at that time given a trial of Trelegy Ellipta.  She was also instructed on nasal hygiene as her cough has a strong upper airway component due to postnasal drip.  HPI Discussed the use of AI scribe software for clinical note transcription with the patient, who gave verbal consent to proceed.  History of Present Illness   [name] [name], a patient with a history of presumed cough variant asthma, presents for a follow-up visit due to persistent coughing. She reports experiencing significant nasal congestion and postnasal drip, particularly noticeable when lying on her left side. Despite taking Singulair and Zyrtec for allergies, the symptoms persist.  She has trialed various inhalers, including Trelegy and Breo, with the latter providing some relief. However, she reports an increase in coughing with Trelegy and she discontinued it.  Uses albuterol rarely.  She also has a history of adverse reaction to Symbicort, which caused chest itching, indicating a possible allergy.  In addition to respiratory symptoms, she has a history of acid reflux, which is currently managed with Pantoprazole (Protonix). She reports no expectoration with the cough, no fevers or chills, and no abnormal color to nasal drainage.  The patient's last PFTs were conducted in  2022, and a chest x-ray from the previous year was reported as normal. She denies any history of smoking. She has used Flonase and Astelin nasal sprays intermittently, but not daily.  Has not been consistent with nasal hygiene.  He has been noted to have environmental allergies particularly to dust mites.  She has not had ENT/allergy eval.  She has not exhibited eosinophilia on CBC previously.   DATA 02/20/2021 allergen panel/IgE: Allergen panel positive for dust mites at class III, cat dog and ragweed at class I. 03/19/2021 PFTs: FVC 2.79 (86%), FEV1 2.24 (96%), ratio 81, TLC 78%, DLCO 86% when corrected for alveolar volume.  Review of Systems A 10 point review of systems was performed and it is as noted above otherwise negative.   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 02/27/2017   Post-traumatic osteoarthritis of right foot 09/08/2016   Endometriosis 01/05/2013    Social History   Tobacco Use   Smoking status: Never   Smokeless tobacco: Never  Substance Use Topics   Alcohol use: No    Alcohol/week: 0.0 standard drinks of alcohol    Allergies  Allergen Reactions   Mosquito (Culex Pipiens) Allergy Skin Test Swelling   Mosquito (Diagnostic) Swelling   Symbicort [Budesonide-Formoterol Fumarate] Itching  Current Meds  Medication Sig   albuterol (VENTOLIN HFA) 108 (90 Base) MCG/ACT inhaler Inhale 2 puffs into the lungs every 6 (six) hours as needed for wheezing or shortness of breath.   Azelastine HCl 0.15 % SOLN Place 1 spray into the nose daily.   beclomethasone (QVAR REDIHALER) 80 MCG/ACT inhaler Inhale 2  puffs into the lungs 2 (two) times daily.   Cetirizine HCl 10 MG CAPS Take 10 mg by mouth daily.   cyclobenzaprine (FLEXERIL) 10 MG tablet Take 1 tablet (10 mg total) by mouth every 8 (eight) hours as needed for muscle spasms.   desonide (DESOWEN) 0.05 % cream Apply topically 2 (two) times daily.   etonogestrel-ethinyl estradiol (ELURYNG) 0.12-0.015 MG/24HR vaginal ring Insert one ring vaginally every 28 days.   EUCRISA 2 % OINT    hydrochlorothiazide (HYDRODIURIL) 25 MG tablet Take 25 mg by mouth daily.   ibuprofen (ADVIL) 800 MG tablet Take 1 tablet (800 mg total) by mouth every 8 (eight) hours as needed.   mometasone (NASONEX) 50 MCG/ACT nasal spray Place 1 spray into the nose 2 (two) times daily.   montelukast (SINGULAIR) 10 MG tablet Take 1 tablet by mouth once daily   nystatin cream (MYCOSTATIN) Apply topically 2 (two) times daily.   nystatin-triamcinolone ointment (MYCOLOG) Apply 1 Application topically 2 (two) times daily.   pantoprazole (PROTONIX) 40 MG tablet Take 40 mg by mouth 2 (two) times daily.   Spacer/Aero-Holding Chambers DEVI 1 each by Does not apply route daily.   SUMAtriptan (IMITREX) 100 MG tablet Take 100 mg by mouth 2 (two) times daily as needed.   triamcinolone cream (KENALOG) 0.1 % Apply topically 2 (two) times daily.   [DISCONTINUED] fluticasone (FLONASE) 50 MCG/ACT nasal spray Place 1 spray into both nostrils 2 (two) times daily.    Immunization History  Administered Date(s) Administered   Influenza Inj Mdck Quad Pf 06/18/2022   Influenza,inj,Quad PF,6+ Mos 05/31/2019, 06/05/2020   Influenza-Unspecified 06/03/2018, 05/31/2019, 06/05/2020, 06/14/2021   PFIZER Comirnaty(Gray Top)Covid-19 Tri-Sucrose Vaccine 10/13/2019, 11/03/2019, 08/07/2020   PFIZER(Purple Top)SARS-COV-2 Vaccination 10/13/2019, 11/03/2019, 08/07/2020   Tdap 09/27/2018        Objective:     BP 116/86 (BP Location: Left Arm, Patient Position: Sitting, Cuff Size: Normal)   Pulse 71   Temp  98 F (36.7 C) (Temporal)   Ht 5\' 3"  (1.6 m)   Wt 147 lb 12.8 oz (67 kg)   SpO2 97%   BMI 26.18 kg/m   SpO2: 97 %  GENERAL: Well-developed well-nourished woman, no acute distress, fully ambulatory.  No conversational dyspnea.  Does almost continuous sniffling and clearing of the throat during the examination. HEAD: Normocephalic, atraumatic.  No sinus tenderness. EYES: Pupils equal, round, reactive to light.  No scleral icterus.   EARS: Mild serous otitis bilaterally, no injection or exudate. NOSE: Turbinate edema bilaterally, clear nasal discharge, narrow nasal passages. MOUTH: Dentition intact, oral mucosa moist.  Clear postnasal drip noted on examination of the pharynx. NECK: Supple. No thyromegaly. Trachea midline. No JVD.  No adenopathy. PULMONARY: Good air entry bilaterally.  No adventitious sounds. CARDIOVASCULAR: S1 and S2. Regular rate and rhythm.  No rubs, murmurs or gallops heard. ABDOMEN: Benign. MUSCULOSKELETAL: No joint deformity, no clubbing, no edema.  NEUROLOGIC: No focal deficit, no gait disturbance, speech is fluent. SKIN: Intact,warm,dry. PSYCH: Mood and behavior normal   Lab Results  Component Value Date   NITRICOXIDE 11 08/07/2023  *No evidence of type II inflammation  Assessment & Plan:  ICD-10-CM   1. Chronic cough  R05.3 Nitric oxide    2. Mild intermittent asthma without complication  J45.20     3. Perennial allergic rhinitis with seasonal variation  J30.89    J30.2     4. Laryngopharyngeal reflux (LPR)  K21.9       Orders Placed This Encounter  Procedures   Nitric oxide    Meds ordered this encounter  Medications   mometasone (NASONEX) 50 MCG/ACT nasal spray    Sig: Place 1 spray into the nose 2 (two) times daily.    Dispense:  17 each    Refill:  6   beclomethasone (QVAR REDIHALER) 80 MCG/ACT inhaler    Sig: Inhale 2 puffs into the lungs 2 (two) times daily.    Dispense:  10.6 each    Refill:  3   Discussion:    Cough  Variant Asthma Persistent cough with nasal congestion. Previous trial of Trelegy increased cough, Breo was better tolerated but not effective overall. No fever, chills, or productive cough. Noted postnasal drip and nasal passage inflammation. Occasional wheezing. Last breathing tests and chest x-ray from 2022 were normal. No smoking history. Discussed that powdered inhalers may exacerbate cough and mist inhalers are gentler on the airway. Symbicort not tolerated due to allergic reaction. Qvar chosen for its simple mechanism and single ingredient. - Prescribe Qvar inhaler, 80 mcg, two puffs twice a day - Educate on proper inhaler technique and importance of rinsing mouth after use - Switch nasal spray to Nasonex, one spray each side twice a day - Consider trial of Allegra if Zyrtec is ineffective - Follow up in 6-8 weeks to assess response to treatment - Refer to ENT if symptoms persist for further evaluation and allergy testing  Allergic Rhinitis Nasal congestion and postnasal drip. Currently using Singulair and Zyrtec. Infrequent use of Flonase and Astelin. Significant nasal passage inflammation observed. Discussed that one nasal passage may be more blocked, causing increased symptoms when lying on the left side. - Switch to Nasonex nasal spray, one spray each side twice a day - Consider trial of Allegra if Zyrtec is ineffective - Follow up in 6-8 weeks to assess response to treatment - Refer to ENT if symptoms persist for further evaluation and allergy testing  Gastroesophageal Reflux Disease (GERD) Acid reflux well-controlled with Pantoprazole. - Continue Pantoprazole as prescribed.     Advised if symptoms do not improve or worsen, to please contact office for sooner follow up or seek emergency care.    I spent 35 minutes of dedicated to the care of this patient on the date of this encounter to include pre-visit review of records, face-to-face time with the patient discussing conditions  above, post visit ordering of testing, clinical documentation with the electronic health record, making appropriate referrals as documented, and communicating necessary findings to members of the patients care team.     C. Danice Goltz, MD Advanced Bronchoscopy PCCM [facility]    *This note was generated using voice recognition software/Dragon and/or AI transcription program.  Despite best efforts to proofread, errors can occur which can change the meaning. Any transcriptional errors that result from this process are unintentional and may not be fully corrected at the time of dictation.

## 2023-08-10 ENCOUNTER — Other Ambulatory Visit: Payer: Self-pay | Admitting: Pulmonary Disease

## 2023-08-10 MED ORDER — ASMANEX HFA 100 MCG/ACT IN AERO: 2.0000 | INHALATION_SPRAY | Freq: Two times a day (BID) | RESPIRATORY_TRACT | 3 refills | Status: AC

## 2023-08-10 NOTE — Progress Notes (Signed)
Received note from [facility] pharmacy, Qvar not available.  Prescribed Asmanex HFA 100 mcg 2 puffs twice a day substitute.  Gailen Shelter, MD Advanced Bronchoscopy PCCM [facility]

## 2023-09-04 ENCOUNTER — Other Ambulatory Visit: Payer: Self-pay | Admitting: Family Medicine

## 2023-09-04 DIAGNOSIS — R102 Pelvic and perineal pain: Secondary | ICD-10-CM

## 2023-09-04 DIAGNOSIS — M545 Low back pain, unspecified: Secondary | ICD-10-CM

## 2023-09-11 ENCOUNTER — Other Ambulatory Visit: Payer: Self-pay | Admitting: *Deleted

## 2023-09-11 DIAGNOSIS — R102 Pelvic and perineal pain: Secondary | ICD-10-CM

## 2023-09-11 DIAGNOSIS — M545 Low back pain, unspecified: Secondary | ICD-10-CM

## 2023-09-11 MED ORDER — CYCLOBENZAPRINE HCL 10 MG PO TABS: 10.0000 mg | ORAL_TABLET | Freq: Three times a day (TID) | ORAL | 1 refills | Status: AC | PRN

## 2023-10-02 ENCOUNTER — Ambulatory Visit: Payer: Managed Care, Other (non HMO) | Admitting: Pulmonary Disease

## 2023-10-26 ENCOUNTER — Other Ambulatory Visit: Payer: Self-pay | Admitting: Orthopedic Surgery

## 2023-10-26 DIAGNOSIS — G8929 Other chronic pain: Secondary | ICD-10-CM

## 2023-11-05 ENCOUNTER — Other Ambulatory Visit: Payer: Self-pay | Admitting: Orthopedic Surgery

## 2023-11-05 DIAGNOSIS — G8929 Other chronic pain: Secondary | ICD-10-CM

## 2023-11-11 ENCOUNTER — Ambulatory Visit (INDEPENDENT_AMBULATORY_CARE_PROVIDER_SITE_OTHER)

## 2023-11-11 DIAGNOSIS — N390 Urinary tract infection, site not specified: Secondary | ICD-10-CM | POA: Diagnosis not present

## 2023-11-11 LAB — POCT URINALYSIS DIPSTICK

## 2023-11-11 MED ORDER — FLUCONAZOLE 150 MG PO TABS: 150.0000 mg | ORAL_TABLET | Freq: Once | ORAL | 1 refills | Status: AC

## 2023-11-11 MED ORDER — NITROFURANTOIN MONOHYD MACRO 100 MG PO CAPS: 100.0000 mg | ORAL_CAPSULE | Freq: Two times a day (BID) | ORAL | 0 refills | Status: AC

## 2023-11-11 NOTE — Progress Notes (Signed)
 SUBJECTIVE:  53 y.o. female complains of dysuria for 4 day(s) and urinary urgency Denies abnormal vaginal bleeding or significant pelvic pain or fever.Denies history of known exposure to STD.  No LMP recorded. (Menstrual status: Other).  OBJECTIVE:  She appears alert, well appearing, in no apparent distress Urine dipstick: positive for RBC's and positive for leukocytes.  ASSESSMENT:  Urinary Urgency Dysuria    PLAN:  urine culture sent to lab. Treatment: To be determined once lab results are received ROV prn if symptoms persist or worsen.  Pt also requesting diflucan due to getting yeast infections after round of Abx

## 2023-11-13 ENCOUNTER — Encounter: Payer: Self-pay | Admitting: Family Medicine

## 2023-11-13 LAB — URINE CULTURE

## 2023-11-14 ENCOUNTER — Other Ambulatory Visit

## 2023-11-16 ENCOUNTER — Ambulatory Visit: Admitting: Obstetrics & Gynecology

## 2023-11-22 ENCOUNTER — Ambulatory Visit
Admission: RE | Admit: 2023-11-22 | Discharge: 2023-11-22 | Disposition: A | Discharge status: Home / Self Care | Source: Ambulatory Visit | Attending: Orthopedic Surgery | Admitting: Orthopedic Surgery

## 2023-11-22 DIAGNOSIS — G8929 Other chronic pain: Secondary | ICD-10-CM

## 2023-11-24 ENCOUNTER — Other Ambulatory Visit

## 2024-02-16 NOTE — Progress Notes (Signed)
 No chief complaint on file.   HPI  [name] [name] is a 53 y.o. here for an acute issue.    She has a history of asthma, allergies, HTN who presents with worsening cough, congestion and some tightness. She was around a lot of people over the Fourth.  No current wheezing.  She is using her inhalers faithfully.    ROS  Pertinent items are noted in HPI.  Outpatient Encounter Medications as of 02/16/2024  Medication Sig Dispense Refill  . albuterol  MDI, PROVENTIL , VENTOLIN , PROAIR , HFA 90 mcg/actuation inhaler Inhale 2 inhalations into the lungs every 4 (four) hours as needed for Wheezing or Shortness of Breath 1 each 1  . ASMANEX  HFA 100 mcg/actuation HFAA Inhale 2 inhalations into the lungs once daily    . cetirizine (ZYRTEC) 10 MG tablet Take 10 mg by mouth once daily    . cyclobenzaprine  (FLEXERIL ) 10 MG tablet Take 10 mg by mouth 3 (three) times daily as needed    . etonogestreL -ethinyl estradioL  (NUVARING ) 0.12-0.015 mg/24 hr vaginal ring Replace Nuvaring  each 4 weeks with no interval    . fluticasone  propionate (FLONASE ) 50 mcg/actuation nasal spray Place 2 sprays into both nostrils once daily as needed for Rhinitis Place 2 sprays into both nostrils daily. 16 g 2  . hydroCHLOROthiazide  (HYDRODIURIL ) 25 MG tablet Take 1 tablet by mouth once daily 100 tablet 1  . montelukast  (SINGULAIR ) 10 mg tablet TAKE 1 TABLET BY MOUTH AT BEDTIME 90 tablet 0  . nystatin -triamcinolone  ointment Apply 1 Application topically 2 (two) times daily as needed    . pantoprazole  (PROTONIX ) 40 MG DR tablet Take 1 tablet by mouth once daily 90 tablet 0  . predniSONE  (DELTASONE ) 10 MG tablet 40mg  x 2 days, 30mg  x 2days, 20mg  x 2days, 10mg  x 2days 20 tablet 0   No facility-administered encounter medications on file as of 02/16/2024.    Allergies as of 02/16/2024 - Reviewed 12/29/2023  Allergen Reaction Noted  . Budesonide -formoterol  Itching 01/12/2019  . Mosquito allergenic extract Swelling 05/05/2018    Past  Medical History:  Diagnosis Date  . Allergic to dogs   . Allergy to mold   . Arthritis   . Asthma without status asthmaticus (HHS-HCC)   . Cat allergies   . Chickenpox   . Environmental and seasonal allergies   . GERD (gastroesophageal reflux disease)   . Migraine headache   . Seasonal allergies     Past Surgical History:  Procedure Laterality Date  . COLONOSCOPY  10/04/2020   Normal colon biopsy/FHx CC-Father/Rpeat 40yrs/CTL  . APPENDECTOMY    . CESAREAN SECTION    . CHOLECYSTECTOMY    . Foot Surgery Right    x2  . TONSILLECTOMY      There were no vitals filed for this visit.  Physical Exam  General. Well appearing; NAD; VS reviewed     HEENT: Sclera and conjunctiva clear; EOMI, Neck. Supple. No thyromegaly, lymphadenopathy. Lungs. Respirations unlabored; clear to auscultation bilaterally. Cardiovascular. Heart regular rate and rhythm without murmurs, gallops, or rubs. Extremities:  No edema. Skin. Normal color and turgor Neurologic. Alert and oriented x3   Assessment and Plan 1. Mild intermittent asthma without complication (HHS-HCC) Seen by pulmonology.  Uses Qvar  instead of Asmanex . -     beclomethasone dipropionate  (QVAR  REDIHALER HFA) 80 mcg/actuation inhaler; Inhale 2 inhalations into the lungs 2 (two) times daily  2. Bronchitis  -     predniSONE  (DELTASONE ) 10 MG tablet; 40mg  x 2 days, 30mg  x  2days, 20mg  x 2days, 10mg  x 2days -     azithromycin  (ZITHROMAX ) 250 MG tablet; Take 2 tablets (500mg ) by mouth on Day 1. Take 1 tablet (250mg ) by mouth on Days 2-5. -     Extended Respiratory Viral Panel - [facility] -     HYDROcodone -chlorpheniramine (TUSSIONEX) 10-8 mg/5 mL ER suspension; Take 5 mLs by mouth every 12 (twelve) hours as needed for up to 7 days    I have personally performed this service.  [address] [place], [place]

## 2024-02-23 ENCOUNTER — Other Ambulatory Visit (HOSPITAL_COMMUNITY)
Admission: RE | Admit: 2024-02-23 | Discharge: 2024-02-23 | Disposition: A | Discharge status: Home / Self Care | Source: Ambulatory Visit | Attending: Obstetrics & Gynecology | Admitting: Obstetrics & Gynecology

## 2024-02-23 ENCOUNTER — Ambulatory Visit (INDEPENDENT_AMBULATORY_CARE_PROVIDER_SITE_OTHER): Admitting: Obstetrics & Gynecology

## 2024-02-23 ENCOUNTER — Encounter: Payer: Self-pay | Admitting: Obstetrics & Gynecology

## 2024-02-23 VITALS — BP 127/78 | HR 61 | Wt 156.0 lb

## 2024-02-23 DIAGNOSIS — Z01419 Encounter for gynecological examination (general) (routine) without abnormal findings: Secondary | ICD-10-CM | POA: Insufficient documentation

## 2024-02-23 DIAGNOSIS — Z1331 Encounter for screening for depression: Secondary | ICD-10-CM

## 2024-02-23 DIAGNOSIS — Z113 Encounter for screening for infections with a predominantly sexual mode of transmission: Secondary | ICD-10-CM | POA: Insufficient documentation

## 2024-02-23 DIAGNOSIS — Z3049 Encounter for surveillance of other contraceptives: Secondary | ICD-10-CM

## 2024-02-23 DIAGNOSIS — Z1231 Encounter for screening mammogram for malignant neoplasm of breast: Secondary | ICD-10-CM

## 2024-02-23 DIAGNOSIS — N809 Endometriosis, unspecified: Secondary | ICD-10-CM | POA: Diagnosis not present

## 2024-02-23 DIAGNOSIS — Z1151 Encounter for screening for human papillomavirus (HPV): Secondary | ICD-10-CM | POA: Insufficient documentation

## 2024-02-23 MED ORDER — ETONOGESTREL-ETHINYL ESTRADIOL 0.12-0.015 MG/24HR VA RING: VAGINAL_RING | VAGINAL | 12 refills | Status: AC

## 2024-02-23 NOTE — Progress Notes (Signed)
 GYNECOLOGY ANNUAL PREVENTATIVE CARE ENCOUNTER NOTE  History:    [name] [name] is a 53 y.o. G1P1 female here for a routine annual gynecologic exam.  Current complaints: has occasional night sweats/hot flashes, managed by lifestyle interventions.  Does not want any other intervention for now.  Desires annual preventative healthcare maintenance labs and STI screen.  Also desires Nuvaring  refill.   Denies abnormal vaginal bleeding, discharge, pelvic pain, problems with intercourse or other gynecologic concerns.  Gynecologic History No LMP recorded. (Menstrual status: Other). Contraception: NuvaRing  vaginal inserts Last Pap: 02/02/2023. Result was normal with negative HPV Last Mammogram: 05/08/2023.  Result was normal Last Colonoscopy: 2022.  Result was normal  Obstetric History OB History  Gravida Para Term Preterm AB Living  1 1    1   SAB IAB Ectopic Multiple Live Births      1    # Outcome Date GA Lbr Len/2nd Weight Sex Type Anes PTL Lv  1 Para 1995    M CS-LTranv   LIV    Past Medical History:  Diagnosis Date   Allergy    Arthritis    Asthma    Constipation    Endometriosis    Uses Nuvaring  continuously   Headache    Heart murmur    Ulcer     Past Surgical History:  Procedure Laterality Date   ANKLE SURGERY Right 06/11/2017   APPENDECTOMY  2008   CESAREAN SECTION  1995   CHOLECYSTECTOMY  2000   LAPAROSCOPIC ENDOMETRIOSIS FULGURATION     spinal tap     WISDOM TOOTH EXTRACTION     x2    Current Outpatient Medications on File Prior to Visit  Medication Sig Dispense Refill   albuterol  (VENTOLIN  HFA) 108 (90 Base) MCG/ACT inhaler Inhale 2 puffs into the lungs every 6 (six) hours as needed for wheezing or shortness of breath. 18 g 1   Azelastine  HCl 0.15 % SOLN Place 1 spray into the nose daily. 90 mL 1   Cetirizine HCl 10 MG CAPS Take 10 mg by mouth daily.     cyclobenzaprine  (FLEXERIL ) 10 MG tablet Take 1 tablet (10 mg total) by mouth every 8 (eight) hours as  needed for muscle spasms. 30 tablet 1   desonide  (DESOWEN ) 0.05 % cream Apply topically 2 (two) times daily. 60 g 0   hydrochlorothiazide  (HYDRODIURIL ) 25 MG tablet Take 25 mg by mouth daily.     mometasone  (NASONEX ) 50 MCG/ACT nasal spray Place 1 spray into the nose 2 (two) times daily. 17 each 6   montelukast  (SINGULAIR ) 10 MG tablet Take 1 tablet by mouth once daily 90 tablet 0   nystatin  cream (MYCOSTATIN ) Apply topically 2 (two) times daily. 30 g 0   nystatin -triamcinolone  ointment (MYCOLOG) Apply 1 Application topically 2 (two) times daily. 30 g 0   pantoprazole  (PROTONIX ) 40 MG tablet Take 40 mg by mouth 2 (two) times daily.     Spacer/Aero-Holding Chambers DEVI 1 each by Does not apply route daily. 1 each 0   EUCRISA 2 % OINT      ibuprofen  (ADVIL ) 800 MG tablet Take 1 tablet (800 mg total) by mouth every 8 (eight) hours as needed. 60 tablet 1   Mometasone  Furoate (ASMANEX  HFA) 100 MCG/ACT AERO Inhale 2 puffs into the lungs 2 (two) times daily. Rinse mouth well after use. 13 g 3   nitrofurantoin , macrocrystal-monohydrate, (MACROBID ) 100 MG capsule Take 1 capsule (100 mg total) by mouth 2 (two) times daily. (Patient not  taking: Reported on 02/23/2024) 14 capsule 0   SUMAtriptan (IMITREX) 100 MG tablet Take 100 mg by mouth 2 (two) times daily as needed. (Patient not taking: Reported on 02/23/2024)     triamcinolone  cream (KENALOG ) 0.1 % Apply topically 2 (two) times daily. 453.6 g 0   No current facility-administered medications on file prior to visit.    Allergies  Allergen Reactions   Mosquito (Culex Pipiens) Allergy Skin Test Swelling   Mosquito (Diagnostic) Swelling   Symbicort  [Budesonide -Formoterol  Fumarate] Itching    Social History:  reports that she has never smoked. She has never used smokeless tobacco. She reports that she does not drink alcohol and does not use drugs.  Family History  Problem Relation Age of Onset   COPD Mother    Emphysema Mother    Cancer Father         unknown   Cancer Paternal Aunt        ovarian   Breast cancer Neg Hx     The following portions of the patient's history were reviewed and updated as appropriate: allergies, current medications, past family history, past medical history, past social history, past surgical history and problem list.  Review of Systems Pertinent items noted in HPI and remainder of comprehensive ROS otherwise negative.  Physical Exam:  BP 127/78   Pulse 61   Wt 156 lb (70.8 kg)   BMI 27.63 kg/m  CONSTITUTIONAL: Well-developed, well-nourished female in no acute distress.  HENT:  Normocephalic, atraumatic, External right and left ear normal.  EYES: Conjunctivae and EOM are normal. Pupils are equal, round, and reactive to light. No scleral icterus.  NECK: Normal range of motion, supple, no masses observed. SKIN: Skin is warm and dry. No rash noted. Not diaphoretic. No erythema. No pallor. MUSCULOSKELETAL: Normal range of motion. No tenderness.  No cyanosis, clubbing, or edema. NEUROLOGIC: Alert and oriented to person, place, and time. Normal muscle tone coordination.  PSYCHIATRIC: Normal mood and affect. Normal behavior. Normal judgment and thought content. CARDIOVASCULAR: Normal heart rate noted, regular rhythm RESPIRATORY: Clear to auscultation bilaterally. Effort and breath sounds normal, no problems with respiration noted. BREASTS: Symmetric in size. No masses, tenderness, skin changes, nipple drainage, or lymphadenopathy bilaterally. Performed in the presence of a chaperone. ABDOMEN: Soft, no distention noted.  No tenderness, rebound or guarding.  PELVIC: Normal appearing external genitalia and urethral meatus; normal appearing vaginal mucosa and cervix.  No abnormal vaginal discharge noted.  Pap smear obtained.  Normal uterine size, no other palpable masses, no uterine or adnexal tenderness.  Performed in the presence of a chaperone.  Assessment and Plan:     1. Endometriosis 2. Encounter for  surveillance of nuvaring  Continue hormonal ring therapy for now, this could also help with vasomotor symptoms. - etonogestrel -ethinyl estradiol  (ELURYNG) 0.12-0.015 MG/24HR vaginal ring; Insert one ring vaginally every 28 days.  Dispense: 3 each; Refill: 12  3. Breast cancer screening by mammogram Mammogram scheduled for breast cancer screening. - MM 3D SCREENING MAMMOGRAM BILATERAL BREAST; Future  4. Routine screening for STI (sexually transmitted infection) STI screen done - Cytology - PAP - RPR+HBsAg+HCVAb+HIV  5. Well woman exam with routine gynecological exam (Primary) - Cytology - PAP - CBC - Comprehensive metabolic panel with GFR - Lipid panel - Hemoglobin A1c - TSH Rfx on Abnormal to Free T4 Pap smear and preventative healthcare maintenance labs done, will follow up results and manage accordingly. Colon cancer screening is up to date. Routine preventative health maintenance measures emphasized. Please refer  to After Visit Summary for other counseling recommendations.      GLORIS HUGGER, MD, FACOG Obstetrician & Gynecologist, [facility] [employer], [facility]

## 2024-02-24 ENCOUNTER — Ambulatory Visit: Payer: Self-pay | Admitting: Obstetrics & Gynecology

## 2024-02-24 LAB — HEMOGLOBIN A1C
Est. average glucose Bld gHb Est-mCnc: 108 mg/dL
Hgb A1c MFr Bld: 5.4 % (ref 4.8–5.6)

## 2024-02-24 LAB — LIPID PANEL
Chol/HDL Ratio: 2.6 ratio (ref 0.0–4.4)
Cholesterol, Total: 163 mg/dL (ref 100–199)
HDL: 63 mg/dL (ref >39)
LDL Chol Calc (NIH): 61 mg/dL (ref 0–99)
Triglycerides: 246 mg/dL — ABNORMAL HIGH (ref 0–149)
VLDL Cholesterol Cal: 39 mg/dL (ref 5–40)

## 2024-02-24 LAB — CBC
Hematocrit: 45.4 % (ref 34.0–46.6)
Hemoglobin: 14.6 g/dL (ref 11.1–15.9)
MCH: 28.7 pg (ref 26.6–33.0)
MCHC: 32.2 g/dL (ref 31.5–35.7)
MCV: 89 fL (ref 79–97)
Platelets: 419 x10E3/uL (ref 150–450)
RBC: 5.09 x10E6/uL (ref 3.77–5.28)
RDW: 13.6 % (ref 11.7–15.4)
WBC: 18.4 x10E3/uL — ABNORMAL HIGH (ref 3.4–10.8)

## 2024-02-24 LAB — COMPREHENSIVE METABOLIC PANEL WITH GFR
ALT: 12 IU/L (ref 0–32)
AST: 12 IU/L (ref 0–40)
Albumin: 4.1 g/dL (ref 3.8–4.9)
Alkaline Phosphatase: 74 IU/L (ref 44–121)
BUN/Creatinine Ratio: 24 — ABNORMAL HIGH (ref 9–23)
BUN: 18 mg/dL (ref 6–24)
Bilirubin Total: 0.4 mg/dL (ref 0.0–1.2)
CO2: 22 mmol/L (ref 20–29)
Calcium: 9.5 mg/dL (ref 8.7–10.2)
Chloride: 101 mmol/L (ref 96–106)
Creatinine, Ser: 0.74 mg/dL (ref 0.57–1.00)
Globulin, Total: 2.6 g/dL (ref 1.5–4.5)
Glucose: 79 mg/dL (ref 70–99)
Potassium: 4 mmol/L (ref 3.5–5.2)
Sodium: 142 mmol/L (ref 134–144)
Total Protein: 6.7 g/dL (ref 6.0–8.5)
eGFR: 97 mL/min/1.73 (ref >59)

## 2024-02-24 LAB — TSH RFX ON ABNORMAL TO FREE T4: TSH: 1.89 u[IU]/mL (ref 0.450–4.500)

## 2024-02-24 LAB — RPR+HBSAG+HCVAB+...
HIV Screen 4th Generation wRfx: NONREACTIVE
Hep C Virus Ab: NONREACTIVE
Hepatitis B Surface Ag: NEGATIVE
RPR Ser Ql: NONREACTIVE

## 2024-03-03 LAB — CYTOLOGY - PAP
Chlamydia: NEGATIVE
Comment: NEGATIVE
Comment: NEGATIVE
Comment: NEGATIVE
Comment: NORMAL
Diagnosis: NEGATIVE
High risk HPV: NEGATIVE
Neisseria Gonorrhea: NEGATIVE
Trichomonas: NEGATIVE

## 2024-05-26 ENCOUNTER — Ambulatory Visit
Admission: RE | Admit: 2024-05-26 | Discharge: 2024-05-26 | Disposition: A | Discharge status: Home / Self Care | Source: Ambulatory Visit | Attending: Obstetrics & Gynecology | Admitting: Obstetrics & Gynecology

## 2024-05-26 DIAGNOSIS — Z1231 Encounter for screening mammogram for malignant neoplasm of breast: Secondary | ICD-10-CM | POA: Insufficient documentation

## 2024-08-07 ENCOUNTER — Other Ambulatory Visit: Payer: Self-pay | Admitting: Pulmonary Disease
